# Patient Record
Sex: Male | Born: 1940 | Race: White | Hispanic: No | Marital: Married | Smoking: Former smoker
Health system: Southern US, Community
[De-identification: ages and names within clinical notes are randomized; demographics above are authoritative.]

## PROBLEM LIST (undated history)

## (undated) DIAGNOSIS — Z860100 Personal history of colon polyps, unspecified: Secondary | ICD-10-CM

## (undated) DIAGNOSIS — K5790 Diverticulosis of intestine, part unspecified, without perforation or abscess without bleeding: Secondary | ICD-10-CM

## (undated) DIAGNOSIS — R160 Hepatomegaly, not elsewhere classified: Secondary | ICD-10-CM

## (undated) DIAGNOSIS — E669 Obesity, unspecified: Secondary | ICD-10-CM

## (undated) DIAGNOSIS — I2510 Atherosclerotic heart disease of native coronary artery without angina pectoris: Secondary | ICD-10-CM

## (undated) DIAGNOSIS — E785 Hyperlipidemia, unspecified: Secondary | ICD-10-CM

## (undated) DIAGNOSIS — G4733 Obstructive sleep apnea (adult) (pediatric): Secondary | ICD-10-CM

## (undated) DIAGNOSIS — I10 Essential (primary) hypertension: Secondary | ICD-10-CM

## (undated) DIAGNOSIS — E119 Type 2 diabetes mellitus without complications: Secondary | ICD-10-CM

## (undated) HISTORY — DX: Personal history of colon polyps, unspecified: Z86.0100

## (undated) HISTORY — DX: Diverticulosis of intestine, part unspecified, without perforation or abscess without bleeding: K57.90

## (undated) HISTORY — DX: Hepatomegaly, not elsewhere classified: R16.0

## (undated) HISTORY — DX: Obstructive sleep apnea (adult) (pediatric): G47.33

## (undated) HISTORY — DX: Personal history of colonic polyps: Z86.010

## (undated) HISTORY — DX: Hyperlipidemia, unspecified: E78.5

## (undated) HISTORY — PX: COLONOSCOPY: SHX174

## (undated) HISTORY — DX: Type 2 diabetes mellitus without complications: E11.9

## (undated) HISTORY — DX: Obesity, unspecified: E66.9

## (undated) HISTORY — PX: POLYPECTOMY: SHX149

## (undated) HISTORY — PX: TONSILLECTOMY: SUR1361

## (undated) HISTORY — DX: Atherosclerotic heart disease of native coronary artery without angina pectoris: I25.10

## (undated) HISTORY — DX: Essential (primary) hypertension: I10

---

## 2004-06-27 ENCOUNTER — Ambulatory Visit (HOSPITAL_COMMUNITY): Admission: RE | Admit: 2004-06-27 | Discharge: 2004-06-27 | Payer: Self-pay | Admitting: Orthopaedic Surgery

## 2004-11-21 ENCOUNTER — Ambulatory Visit: Payer: Self-pay | Admitting: Family Medicine

## 2006-08-30 HISTORY — PX: CORONARY STENT PLACEMENT: SHX1402

## 2006-11-27 ENCOUNTER — Ambulatory Visit: Payer: Self-pay | Admitting: Cardiology

## 2006-11-27 ENCOUNTER — Inpatient Hospital Stay (HOSPITAL_COMMUNITY): Admission: EM | Admit: 2006-11-27 | Discharge: 2006-11-28 | Payer: Self-pay | Admitting: Emergency Medicine

## 2006-12-10 ENCOUNTER — Ambulatory Visit: Payer: Self-pay | Admitting: Internal Medicine

## 2007-02-25 ENCOUNTER — Ambulatory Visit: Payer: Self-pay | Admitting: Cardiovascular Disease

## 2007-04-02 ENCOUNTER — Ambulatory Visit: Payer: Self-pay | Admitting: Internal Medicine

## 2007-04-17 ENCOUNTER — Ambulatory Visit: Payer: Self-pay

## 2007-04-17 ENCOUNTER — Encounter: Payer: Self-pay | Admitting: Internal Medicine

## 2007-06-09 ENCOUNTER — Ambulatory Visit: Payer: Self-pay | Admitting: Cardiovascular Disease

## 2007-08-22 ENCOUNTER — Ambulatory Visit: Payer: Self-pay | Admitting: Cardiovascular Disease

## 2007-10-23 ENCOUNTER — Ambulatory Visit: Payer: Self-pay | Admitting: Internal Medicine

## 2007-12-02 ENCOUNTER — Ambulatory Visit: Payer: Self-pay | Admitting: Cardiovascular Disease

## 2007-12-03 ENCOUNTER — Ambulatory Visit: Payer: Self-pay | Admitting: Cardiovascular Disease

## 2008-03-19 ENCOUNTER — Ambulatory Visit: Payer: Self-pay | Admitting: Cardiovascular Disease

## 2008-06-18 ENCOUNTER — Ambulatory Visit: Payer: Self-pay | Admitting: Cardiovascular Disease

## 2008-07-06 ENCOUNTER — Ambulatory Visit: Payer: Self-pay | Admitting: Internal Medicine

## 2008-09-24 ENCOUNTER — Ambulatory Visit: Payer: Self-pay | Admitting: Cardiovascular Disease

## 2008-12-21 DIAGNOSIS — I10 Essential (primary) hypertension: Secondary | ICD-10-CM | POA: Insufficient documentation

## 2008-12-21 DIAGNOSIS — E669 Obesity, unspecified: Secondary | ICD-10-CM | POA: Insufficient documentation

## 2008-12-21 DIAGNOSIS — G4733 Obstructive sleep apnea (adult) (pediatric): Secondary | ICD-10-CM | POA: Insufficient documentation

## 2008-12-21 DIAGNOSIS — E785 Hyperlipidemia, unspecified: Secondary | ICD-10-CM | POA: Insufficient documentation

## 2008-12-21 DIAGNOSIS — I2510 Atherosclerotic heart disease of native coronary artery without angina pectoris: Secondary | ICD-10-CM | POA: Insufficient documentation

## 2008-12-22 ENCOUNTER — Ambulatory Visit: Payer: Self-pay | Admitting: Cardiovascular Disease

## 2008-12-22 ENCOUNTER — Encounter: Payer: Self-pay | Admitting: Physician Assistant

## 2008-12-22 ENCOUNTER — Ambulatory Visit: Payer: Self-pay | Admitting: Internal Medicine

## 2008-12-22 ENCOUNTER — Encounter: Payer: Self-pay | Admitting: Cardiology

## 2008-12-22 DIAGNOSIS — I259 Chronic ischemic heart disease, unspecified: Secondary | ICD-10-CM | POA: Insufficient documentation

## 2008-12-28 ENCOUNTER — Telehealth: Payer: Self-pay | Admitting: Internal Medicine

## 2008-12-29 ENCOUNTER — Ambulatory Visit: Payer: Self-pay | Admitting: Cardiology

## 2008-12-29 ENCOUNTER — Inpatient Hospital Stay (HOSPITAL_BASED_OUTPATIENT_CLINIC_OR_DEPARTMENT_OTHER): Admission: RE | Admit: 2008-12-29 | Discharge: 2008-12-29 | Payer: Self-pay | Admitting: Cardiology

## 2009-01-05 ENCOUNTER — Ambulatory Visit: Payer: Self-pay | Admitting: Cardiovascular Disease

## 2009-01-05 ENCOUNTER — Encounter: Payer: Self-pay | Admitting: Physician Assistant

## 2009-01-05 ENCOUNTER — Encounter: Payer: Self-pay | Admitting: Internal Medicine

## 2009-01-05 ENCOUNTER — Ambulatory Visit: Payer: Self-pay | Admitting: Cardiology

## 2009-01-05 DIAGNOSIS — T148XXA Other injury of unspecified body region, initial encounter: Secondary | ICD-10-CM | POA: Insufficient documentation

## 2009-02-11 ENCOUNTER — Telehealth: Payer: Self-pay | Admitting: Internal Medicine

## 2009-02-14 ENCOUNTER — Ambulatory Visit: Payer: Self-pay | Admitting: Internal Medicine

## 2009-02-17 LAB — CONVERTED CEMR LAB
ALT: 24 units/L (ref 0–53)
AST: 25 units/L (ref 0–37)
Bilirubin, Direct: 0.1 mg/dL (ref 0.0–0.3)
LDL Cholesterol: 53 mg/dL (ref 0–99)
Total Bilirubin: 1.1 mg/dL (ref 0.3–1.2)
Total CHOL/HDL Ratio: 3
VLDL: 11 mg/dL (ref 0.0–40.0)

## 2009-03-07 ENCOUNTER — Ambulatory Visit: Payer: Self-pay | Admitting: Internal Medicine

## 2009-03-07 DIAGNOSIS — R609 Edema, unspecified: Secondary | ICD-10-CM

## 2009-03-07 HISTORY — DX: Edema, unspecified: R60.9

## 2009-12-20 ENCOUNTER — Encounter: Payer: Self-pay | Admitting: Internal Medicine

## 2009-12-28 ENCOUNTER — Encounter: Payer: Self-pay | Admitting: Internal Medicine

## 2010-02-28 ENCOUNTER — Ambulatory Visit: Payer: Self-pay | Admitting: Internal Medicine

## 2010-06-18 ENCOUNTER — Ambulatory Visit: Payer: Self-pay | Admitting: Cardiology

## 2010-06-18 ENCOUNTER — Inpatient Hospital Stay (HOSPITAL_COMMUNITY): Admission: EM | Admit: 2010-06-18 | Discharge: 2010-06-20 | Payer: Self-pay | Admitting: Emergency Medicine

## 2010-06-18 ENCOUNTER — Ambulatory Visit: Payer: Self-pay | Admitting: Internal Medicine

## 2010-08-27 LAB — CONVERTED CEMR LAB
BUN: 10 mg/dL (ref 6–23)
Basophils Relative: 0.3 % (ref 0.0–3.0)
CO2: 28 meq/L (ref 19–32)
Chloride: 111 meq/L (ref 96–112)
Eosinophils Relative: 2.5 % (ref 0.0–5.0)
Glucose, Bld: 97 mg/dL (ref 70–99)
Lymphocytes Relative: 31 % (ref 12.0–46.0)
MCV: 97.2 fL (ref 78.0–100.0)
Monocytes Relative: 8.4 % (ref 3.0–12.0)
Neutrophils Relative %: 57.8 % (ref 43.0–77.0)
Potassium: 4 meq/L (ref 3.5–5.1)
RBC: 4.29 M/uL (ref 4.22–5.81)
WBC: 6.6 10*3/uL (ref 4.5–10.5)
aPTT: 30.9 s — ABNORMAL HIGH (ref 21.7–28.8)

## 2010-08-31 NOTE — Assessment & Plan Note (Signed)
Summary: W1U      Allergies Added: NKDA  Visit Type:  Follow-up Primary Provider:  Christian Mate, DO  CC:  no complaints.  History of Present Illness:  [name] is a delightful 70 year old male with a history of hypertension, hyperlipidemia, and obstructive sleep apnea.  He has a history of 1-vessel coronary artery disease and is status post stenting of his LAD with Cypher drug-eluting stent in February 2008.  He has normal LV function.   In june he underwent re-look cath as part of the Saturn study which showed patent LAD stent and no other significant CAD. He returns for routine f/u.   Doing great. No CP/SOB. Wallks 1.5+ miles/day at decent pace. No problems with bleeding on Plavix. Occ mild edema. No orthopnea, PND. Very active. Using CPAP religiously.  Most recent lipids with TC 149 HDL 46 LDL 87 (previously 53) TG 81   Current Medications (verified): 1)  Plavix 75 Mg Tabs (Clopidogrel Bisulfate) .... Take One Tablet By Mouth Daily 2)  Centrum Silver  Tabs (Multiple Vitamins-Minerals) .... Take 1 Tablet By Mouth Once A Day 3)  Glucosamine Msm Complex  Tabs (Glucos-Msm-C-Mn-Ginger-Willow) .... Take 1 Tablet By Mouth Two Times A Day 4)  Aspirin 81 Mg Tbec (Aspirin) .... Take One Tablet By Mouth Daily 5)  Nitroglycerin 0.4 Mg Subl (Nitroglycerin) .... One Tablet Under Tongue Every 5 Minutes As Needed For Chest Pain---May Repeat Times Three 6)  Lipitor 40 Mg Tabs (Atorvastatin Calcium) .... Take One Tablet By Mouth Daily. 7)  Fish Oil   Oil (Fish Oil) .... Take One Tablet By Mouth Once Daily. 8)  Lisinopril 40 Mg Tabs (Lisinopril) .... Take One Tablet By Mouth Daily  Allergies (verified): No Known Drug Allergies  Past History:  Past Medical History: Last updated: 12/21/2008 CAD (ICD-414.00)- 1-vessel coronary artery disease and is status post stenting of his LAD with Cypher  drug-eluting stent in February 2008. HYPERLIPIDEMIA (ICD-272.4) HYPERTENSION (ICD-401.9) OBESITY  (ICD-278.00) SLEEP APNEA, OBSTRUCTIVE (ICD-327.23)  Review of Systems       As per HPI and past medical history; otherwise all systems negative.   Vital Signs:  Patient profile:   70 year old male Height:      70 inches Weight:      227 pounds BMI:     32.69 Pulse rate:   62 / minute BP sitting:   118 / 68  (right arm) Cuff size:   regular  Vitals Entered By: Hardin Negus, RMA (February 28, 2010 11:09 AM)  Physical Exam  General:   Well-nournished, in no acute distress. Neck: No JVD, HJR, Bruit, or thyroid enlargement Lungs: No tachypnea, clear without wheezing, rales, or rhonchi Cardiovascular: RRR, PMI not displaced, soft SEM over aortic valve. S2 ok . +s4  Abdomen: BS normal. Soft without organomegaly, masses, lesions or tenderness. Extremities: no cyanosis, clubbing. tr - 1+ edema. Good distal pulses bilateral SKin: Warm, no lesions or rashes  Musculoskeletal: No deformities Neuro: no focal signs    Impression & Recommendations:  Problem # 1:  CAD (ICD-414.00) Stable. No evidence of ischemia. Continue current regimen.  Problem # 2:  HYPERLIPIDEMIA (ICD-272.4) LDL has crept up without signifcant change in diet or meds. Will have them repeated in 1 month or 2 and if LDL still > 70 increase lipitor to 80.   Problem # 3:  EDEMA (ICD-782.3) Unchanged. Discussed possible addition of prn lasix but he says edema is mild and doesn't bother him. Will keep legs elevated. Knows to  call if getting worse.   Other Orders: EKG w/ Interpretation (93000)  Patient Instructions: 1)  Your physician wants you to follow-up in:  1 year.  You will receive a reminder letter in the mail two months in advance. If you don't receive a letter, please call our office to schedule the follow-up appointment. Prescriptions: NITROGLYCERIN 0.4 MG SUBL (NITROGLYCERIN) One tablet under tongue every 5 minutes as needed for chest pain---may repeat times three  #25 x 6   Entered by:   Meredith Staggers,  RN   Authorized by:   Dolores Patty, MD, [facility]   Signed by:   Meredith Staggers, RN on 02/28/2010   Method used:   Faxed to ...       [facility]       [address]       [place], [place]  [address]       Ph: [phone] or [phone]       Fax: [phone]   RxID:   [id number]

## 2010-09-21 ENCOUNTER — Encounter: Payer: Self-pay | Admitting: Internal Medicine

## 2010-09-21 ENCOUNTER — Ambulatory Visit (INDEPENDENT_AMBULATORY_CARE_PROVIDER_SITE_OTHER): Payer: Medicare Other | Admitting: Internal Medicine

## 2010-09-21 DIAGNOSIS — E785 Hyperlipidemia, unspecified: Secondary | ICD-10-CM

## 2010-09-21 DIAGNOSIS — I2510 Atherosclerotic heart disease of native coronary artery without angina pectoris: Secondary | ICD-10-CM

## 2010-09-21 DIAGNOSIS — I10 Essential (primary) hypertension: Secondary | ICD-10-CM

## 2010-09-26 NOTE — Assessment & Plan Note (Signed)
Summary: eph/ jml  per chris b rs per pt call-mb  Medications Added GLUCOSAMINE MSM COMPLEX  TABS (GLUCOS-MSM-C-MN-GINGER-WILLOW) Take 1 tablet by mouth daily * OTC PROSTATE MEDICATION Take 1 tablet by mouth once a day      Allergies Added: NKDA  Visit Type:  Follow-up Primary Provider:  Vernon Prey, MD  CC:  no complaints.  History of Present Illness:  [name] is a delightful 70 year old male with a history of hypertension, hyperlipidemia, and obstructive sleep apnea.  He has a history of 1-vessel coronary artery disease and is status post stenting of his LAD with Cypher drug-eluting stent in February 2008.  He has normal LV function.   Admitted for CP in 11/11. Left heart cardiac catheterization revealing patent stent in the proximal left anterior descending, but otherwise, nonobstructive disease and ejection fraction of 55-60% without regional wall motion abnormalities.  Doing great. No CP/SOB. Wallks 1 miles/day at decent pace 3.5-4.0 mph 1.5% incline every day. + some weights on MWF. No problems with bleeding on Plavix. No orthopnea, PND. Using CPAP religiously. No palpitations.   Due for lipid check with PCP soon.  Feels like one of his medicines gives him a hunger pain.   Problems Prior to Update: 1)  Edema  (ICD-782.3) 2)  Hematoma  (ICD-924.9) 3)  Unspecified Chronic Ischemic Heart Disease  (ICD-414.9) 4)  Cad  (ICD-414.00) 5)  Hyperlipidemia  (ICD-272.4) 6)  Hypertension  (ICD-401.9) 7)  Obesity  (ICD-278.00) 8)  Sleep Apnea, Obstructive  (ICD-327.23)  Current Medications (verified): 1)  Plavix 75 Mg Tabs (Clopidogrel Bisulfate) .... Take One Tablet By Mouth Daily 2)  Centrum Silver  Tabs (Multiple Vitamins-Minerals) .... Take 1 Tablet By Mouth Once A Day 3)  Glucosamine Msm Complex  Tabs (Glucos-Msm-C-Mn-Ginger-Willow) .... Take 1 Tablet By Mouth Daily 4)  Aspirin 81 Mg Tbec (Aspirin) .... Take One Tablet By Mouth Daily 5)  Nitroglycerin 0.4 Mg Subl (Nitroglycerin) ....  One Tablet Under Tongue Every 5 Minutes As Needed For Chest Pain---May Repeat Times Three 6)  Lipitor 40 Mg Tabs (Atorvastatin Calcium) .... Take One Tablet By Mouth Daily. 7)  Fish Oil   Oil (Fish Oil) .... Take One Tablet By Mouth Once Daily. 8)  Lisinopril 40 Mg Tabs (Lisinopril) .... Take One Tablet By Mouth Daily 9)  Otc Prostate Medication .... Take 1 Tablet By Mouth Once A Day  Allergies (verified): No Known Drug Allergies  Past History:  Past Medical History: CAD (ICD-414.00)- 1-vessel coronary artery disease and is status post stenting of his LAD with Cypher  drug-eluting stent in February 2008.    --cath 11/11: LAD stent patent/ otherwise minimal CAD. Normal EF HYPERLIPIDEMIA (ICD-272.4) HYPERTENSION (ICD-401.9) OBESITY (ICD-278.00) SLEEP APNEA, OBSTRUCTIVE (ICD-327.23)  Review of Systems       As per HPI and past medical history; otherwise all systems negative.   Vital Signs:  Patient profile:   70 year old male Height:      70 inches Weight:      232.50 pounds Pulse rate:   67 / minute BP sitting:   120 / 58  (right arm) Cuff size:   regular  Vitals Entered By: Micki Riley CNA (September 21, 2010 9:49 AM)  Physical Exam  General:   Well-nournished, in no acute distress. Neck: No JVD, HJR, Bruit, or thyroid enlargement Lungs: No tachypnea, clear without wheezing, rales, or rhonchi Cardiovascular: RRR, PMI not displaced, soft SEM over aortic valve. S2 ok . +s4  Abdomen: BS normal. Soft without organomegaly,  masses, lesions or tenderness. Extremities: no cyanosis, clubbing. no edema. Good distal pulses bilateral SKin: Warm, no lesions or rashes  Musculoskeletal: No deformities Neuro: no focal signs    Impression & Recommendations:  Problem # 1:  CAD (ICD-414.00) Stable. Recent cath looked good. Continue current regimen including Plavix. Discusssed use of interval training to help build fitness.   Problem # 2:  HYPERTENSION (ICD-401.9) Blood pressure  well controlled. Continue current regimen.  Problem # 3:  HYPERLIPIDEMIA (ICD-272.4) Doing well on current regimen. Goal LDL < 70. Will have lipid/liver panel done as part of physical with PCP and send it to [place].   Other Orders: EKG w/ Interpretation (93000)  Patient Instructions: 1)  Your physician wants you to follow-up in:   9 months.  You will receive a reminder letter in the mail two months in advance. If you don't receive a letter, please call our office to schedule the follow-up appointment.

## 2010-10-10 LAB — COMPREHENSIVE METABOLIC PANEL
ALT: 61 U/L — ABNORMAL HIGH (ref 0–53)
AST: 64 U/L — ABNORMAL HIGH (ref 0–37)
Albumin: 4 g/dL (ref 3.5–5.2)
CO2: 25 mEq/L (ref 19–32)
Calcium: 9.4 mg/dL (ref 8.4–10.5)
Chloride: 104 mEq/L (ref 96–112)
Creatinine, Ser: 1.1 mg/dL (ref 0.4–1.5)
GFR calc Af Amer: >60 mL/min (ref >60)
GFR calc non Af Amer: >60 mL/min (ref >60)
Sodium: 138 mEq/L (ref 135–145)

## 2010-10-10 LAB — LIPID PANEL
Cholesterol: 105 mg/dL (ref 0–200)
LDL Cholesterol: 51 mg/dL (ref 0–99)
Triglycerides: 92 mg/dL (ref <150)

## 2010-10-10 LAB — CBC
HCT: 37.3 % — ABNORMAL LOW (ref 39.0–52.0)
Hemoglobin: 12.6 g/dL — ABNORMAL LOW (ref 13.0–17.0)
Hemoglobin: 14.1 g/dL (ref 13.0–17.0)
MCH: 33.4 pg (ref 26.0–34.0)
MCH: 33.8 pg (ref 26.0–34.0)
MCHC: 33.3 g/dL (ref 30.0–36.0)
MCHC: 33.8 g/dL (ref 30.0–36.0)
MCHC: 34.7 g/dL (ref 30.0–36.0)
MCV: 100 fL (ref 78.0–100.0)
MCV: 100.3 fL — ABNORMAL HIGH (ref 78.0–100.0)
Platelets: 139 10*3/uL — ABNORMAL LOW (ref 150–400)
Platelets: 161 10*3/uL (ref 150–400)
RBC: 3.83 MIL/uL — ABNORMAL LOW (ref 4.22–5.81)
RBC: 4.13 MIL/uL — ABNORMAL LOW (ref 4.22–5.81)
RDW: 13.3 % (ref 11.5–15.5)

## 2010-10-10 LAB — BASIC METABOLIC PANEL
CO2: 24 mEq/L (ref 19–32)
Calcium: 8.7 mg/dL (ref 8.4–10.5)
Chloride: 108 mEq/L (ref 96–112)
Creatinine, Ser: 0.89 mg/dL (ref 0.4–1.5)
GFR calc Af Amer: >60 mL/min (ref >60)
Glucose, Bld: 96 mg/dL (ref 70–99)

## 2010-10-10 LAB — POCT CARDIAC MARKERS
CKMB, poc: <1 ng/mL — ABNORMAL LOW (ref 1.0–8.0)
Troponin i, poc: <0.05 ng/mL (ref 0.00–0.09)

## 2010-10-10 LAB — CARDIAC PANEL(CRET KIN+CKTOT+MB+TROPI)
CK, MB: 2 ng/mL (ref 0.3–4.0)
Relative Index: INVALID (ref 0.0–2.5)
Total CK: 83 U/L (ref 7–232)

## 2010-10-10 LAB — CK TOTAL AND CKMB (NOT AT ARMC)
CK, MB: 1.9 ng/mL (ref 0.3–4.0)
Relative Index: INVALID (ref 0.0–2.5)
Total CK: 78 U/L (ref 7–232)

## 2010-10-10 LAB — HEPARIN LEVEL (UNFRACTIONATED): Heparin Unfractionated: <0.1 IU/mL — ABNORMAL LOW (ref 0.30–0.70)

## 2010-10-10 LAB — PROTIME-INR: Prothrombin Time: 13.2 seconds (ref 11.6–15.2)

## 2010-10-10 LAB — TSH: TSH: 2.899 u[IU]/mL (ref 0.350–4.500)

## 2010-12-12 NOTE — Cardiovascular Report (Signed)
NAMEJIOVANY, [name] NO.:  [id number]   MEDICAL RECORD NO.:  [id number]          PATIENT TYPE:  OIB   LOCATION:  1962                         FACILITY:  [facility]   PHYSICIAN:  Jared Morton. Riley Kill, MD, FACCDATE OF BIRTH:  Jan 29, 1941   DATE OF PROCEDURE:  DATE OF DISCHARGE:                            CARDIAC CATHETERIZATION   INDICATIONS:  [name] is a very pleasant gentleman who underwent  cardiac catheterization and stenting of the left anterior descending  artery after presenting with exertional chest pain.  He had successful  placement of 3 x 18 Cypher drug-eluting stent.  As part of the  procedure, the patient consented to the SATURN trial, with intravascular  ultrasound of the circumflex coronary artery.  He returns today for  repeat cardiac catheterization with intravascular ultrasound.  Risks,  benefits, and alternatives have been discussed with the patient, the  nature of the research trial and indications for proceeding have been  discussed.  Questions have been answered.  The patient was agreeable to  proceed.   PROCEDURES:  1. Left heart catheterization.  2. Selective coronary arteriography.  3. Selective left ventriculography.  4. Intravascular ultrasound of the circumflex coronary artery.  5. Femoral closure.   DESCRIPTION OF PROCEDURE:  The patient was brought to the  catheterization laboratory after informed consent.  Prepped and draped  in the usual fashion.  Through an anterior puncture, the right femoral  artery was easily entered on the first stick and a 6-French femoral  sheath placed.  Following this, views of the right coronary artery were  obtained.  Intravenous heparin was given at a dose of 50 units per kg.  ACT was subsequently checked and found to be slightly over 300 seconds.  Following this, central aortic and left ventricular pressures were  measured with pigtail and ventriculography was performed in the RAO  projection.  Following  this, a JL 3.5 guiding catheter was utilized to  engage the left main.  Views of the previously placed stent were  obtained.  In addition, views of the circumflex coronary artery for  appropriateness of intravascular ultrasound were then obtained.  After  this, a Prowater guidewire was placed carefully down into the distal  vessel.  Intracoronary nitroglycerin was given at 150 mcg.  Following  this, intravascular ultrasound was performed with more than a greater  than 40-mm pullback.  The ultrasonic catheter was subsequently removed.  Following this, the wire was removed and a final angiogram of the  circumflex obtained.  Catheters were subsequently removed.  An  additional femoral angiogram was then performed, which demonstrated the  entry site above the bifurcation.  The groin was then reprepped and the  gloves exchanged.  An Angio-Seal 6-French device was then deployed with  excellent hemostasis.  There were no major complications.  He was taken  to the holding area in satisfactory clinical condition.  After this, I  reviewed the films with his wife.  I then explained to the patient and  his wife instructions with regard to groin care and with specific  attention to no tub baths.  HEMODYNAMIC DATA:  1. Central aortic pressure 117/62, mean 86.  2. Left ventricular pressure 120/21.  3. No gradient or pullback across the aortic valve.   ANGIOGRAPHIC DATA:  1. Ventriculography in the RAO projection reveals vigorous global      systolic function.  No definite wall motion abnormalities seen and      there does not appear to be significant mitral regurgitation.  2. The right coronary artery is a moderate-sized vessel.  There is      mild tip narrowing in the very proximal portion of the vessel, but      this is at the catheter tip, presumed to be catheter related spasm.      It is not flow limiting.  Importantly, the remainder of the artery      appears without significant narrowing, but  with minimal luminal      irregularity.  3. The left main is free of critical disease and is short.  4. The LAD courses to the apex and provides a diagonal and distal LAD.      The previously placed Cypher stent is widely patent.  5. The circumflex is a large-caliber vessel without significant focal      narrowing.  It consists predominantly of one very large marginal      branch.  There is a tiny intermediate tiny first and second      marginal branches, which are insignificant with regard to the      vascular distribution.  The third marginal is what supplies the      entire remainder of the vessel.   Intravascular ultrasound was performed over 40 mm.  There was mild  luminal plaquing, but no significant areas of high-grade focal  obstruction.   CONCLUSION:  1. Normal left ventricular function.  2. Continued patency of the previously placed stent to the left      anterior descending artery.  3. Successful intravascular ultrasound of the circumflex artery and      the SATURN trial.      Jared Morton. Riley Kill, MD, [facility]  Electronically Signed     TDS/MEDQ  D:  12/29/2008  T:  12/29/2008  Job:  [id number]   cc:   Jared Cooler. Bensimhon, MD  Jared Conn, RN

## 2010-12-12 NOTE — Assessment & Plan Note (Signed)
[facility]                            CARDIOLOGY OFFICE NOTE   [name], [name]                        MRN:          [id number]  DATE:10/23/2007                            DOB:          1941/07/17    PRIMARY CARE PHYSICIAN:  [name], M.D., [facility].   HISTORY:  [name] is a delightful 70 year old male with a history of  hypertension, hyperlipidemia, and obstructive sleep apnea.  He also has  a history of one-vessel coronary artery disease and is status post PTCA  and stenting of the LAD with Cypher drug-eluting stent back in February  of 2008.  He returns for routine followup.   He is doing great.  He is walking about 25-30 minutes a day at a very  brisk pace without any problems.  He says he occasionally gets a twinge  of chest pain at rest which is chronic for him.  He has not had anything  like his previous angina.  His blood pressure has been well-controlled.  He has not had any problems with his medications.   CURRENT MEDICATIONS:  1. Aspirin 325 a day.  2. Plavix 75 a day.  3. Centrum Silver.  4. Glucosamine.  5. SATURN study drug which is Crestor versus Lipitor.  6. Lisinopril 40 mg a day.   PHYSICAL EXAMINATION:  GENERAL:  He is well-appearing and in no acute  distress.  He ambulates around the clinic without any respiratory  difficulty.  VITAL SIGNS:  Blood pressure 100/60, heart rate 76, weight 225.  HEENT:  Normal.  NECK:  Supple.  No JVD.  Carotids are 2+ bilateral without any bruits.  There is no lymphadenopathy or thyromegaly.  CARDIAC:  PMI is nondisplaced.  Regular rate and rhythm.  No murmurs,  rubs or gallops.  LUNGS:  Clear.  ABDOMEN:  Obese, nontender, nondistended.  No hepatosplenomegaly, no  bruits, no masses.  Good bowel sounds.  EXTREMITIES:  Warm with no cyanosis, clubbing or edema.  Good distal  pulses.  NEUROLOGIC:  Alert and oriented x3.  Cranial nerves II-XII intact.  Moves all four extremities  without difficulty.  Affect is pleasant.   EKG:  Sinus rhythm at a rate of 75, no ST-T wave abnormalities.   ASSESSMENT AND PLAN:  1. Coronary artery disease.  He is doing well.  No evidence of      ischemia.  Continue current therapy.  2. Hyperlipidemia.  He is using the SATURN study.  3. Hypertension.  Well-controlled.   DISPOSITION:  He is doing great.  I will see him back in 9 months for  routine followup.  He knows to call me if there are any problems.     [name]. Bensimhon, MD  Electronically Signed    DRB/MedQ  DD: 10/23/2007  DT: 10/24/2007  Job #: [id number]   cc:   [name]

## 2010-12-12 NOTE — Discharge Summary (Signed)
[name], [name] NO.:  [id number]   MEDICAL RECORD NO.:  [id number]          PATIENT TYPE:  INP   LOCATION:  6525                         FACILITY:  [facility]   PHYSICIAN:  Bevelyn Buckles. Bensimhon, MDDATE OF BIRTH:  July 10, 1941   DATE OF ADMISSION:  11/26/2006  DATE OF DISCHARGE:  11/28/2006                               DISCHARGE SUMMARY   PRIMARY CARDIOLOGIST:  Bevelyn Buckles. Bensimhon, MD.   PRIMARY CARE [name]:  Dr. Samuel Ward, in [place].   PRINCIPAL DIAGNOSIS:  Unstable angina/coronary artery disease.   SECONDARY DIAGNOSES:  1. Hypertension.  2. Hyperlipidemia.  3. Obesity.  4. Asymptomatic bradycardia.  5. History of nosebleeds.   ALLERGIES:  NO KNOWN DRUG ALLERGIES.   PROCEDURES:  Left heart cardiac catheterization with successful PCI and  stenting of the proximal LAD with placement of a CYPHER drug-eluting  stent.   HISTORY OF PRESENT ILLNESS:  The patient is a 70 year old Caucasian male  with a two-week history of right-sided chest discomfort that has been  worsening with exertion and resolving with rest.  He had been referred  to see Dr. Gala Ward on Dec 11, 2006; however, his pain worsened on the  evening of November 25, 2006, thus prompting him to present to the [facility] ED for further evaluation.  In the ED, ECG showed sinus bradycardia  without any acute ST-T changes and he was pain free.  Decision was made  to admit him for evaluation.   HOSPITAL COURSE:  [name] ruled out for MI.  Given his exertional  symptoms, the decision was made to pursue left heart cardiac  catheterization which took place on November 27, 2006, and revealed a 90%  stenosis in the proximal LAD and otherwise nonobstructive disease.  His  EF was normal.  He then underwent successful PCI and stenting of the  proximal LAD with the placement of 3- x 18-mm CYPHER drug-eluting stent.  The patient was enrolled in the Saturn trial which involved  intravascular ultrasound  within the left circumflex as well as a Saturn  Statin drug postprocedure (Crestor versus Lipitor).   Postprocedure, the patient's cardiac markers have remained negative and  he has been ambulating without difficulty or recurrent symptoms.  He is  being discharged home today in satisfactory condition.   DISCHARGE LABS:  Hemoglobin 13.9, hematocrit 39.7, WBC 8.8, platelets  275,000, MCV 92.8.  Sodium 141, potassium 4.4, chloride 104, CO2 28, BUN  10, creatinine 1.04, glucose 102.  PT 13.3, INR 1.0, PTT 38, total  bilirubin 0.5, alkaline phosphatase 76, AST 23, ALT 33, albumin 3.9, CK  78, MB 2.3, troponin I 0.03, calcium 9.4.  TSH 2.813.   DISPOSITION:  The patient is being discharged home today in good  condition.   FOLLOW-UP PLANS AND APPOINTMENTS:  1. The patient has follow up with Dr. Arvilla Ward on Dec 11, 2006, at 2:30 p.m.  2. He is also to follow up with his primary care physician, Dr. Christian Ward, as scheduled.   DISCHARGE MEDICATIONS:  1.  Aspirin 325 mg daily.  2. Plavix 75 mg daily.  3. Lisinopril 10 mg daily.  4. Lipitor 80 mg q.h.s. until notified by our research coordinators to      switch over to the Saturn study drug (Crestor 5 mg versus Lipitor      40 mg).  5. Centrum Silver multivitamin one daily.  6. Nitroglycerin 0.4 mg sublingual p.r.n. chest pain.   As the patient has been enrolled in the Saturn study, he has been  advised to stop his fish oil.   OUTSTANDING LAB STUDIES:  None.   DURATION DISCHARGE ENCOUNTER:  Approximately 40 minutes including  physician time.      [name], ANP      Bevelyn Buckles. Bensimhon, MD  Electronically Signed    CB/MEDQ  D:  11/28/2006  T:  11/28/2006  Job:  [id number]   cc:   [name]

## 2010-12-12 NOTE — Assessment & Plan Note (Signed)
[facility]                            CARDIOLOGY OFFICE NOTE   JAQUIN, COY                        MRN:          [id number]  DATE:04/02/2007                            DOB:          [birth year]    PRIMARY CARE PHYSICIAN:  Dr. [name] in [place].   INTERVAL HISTORY:  Mr. Florea is a delightful 70 year old male with a  history of hypertension, hyperlipidemia, and obstructive sleep apnea.  He also has a history of 1-vessel coronary artery disease and is status  post PTCA and stenting of the LAD with a CYPHER drug-eluting stent back  in February of 2008.  He returns today for routine followup.   He says he is doing quite well.  He occasionally has a little spasm in  his chest, but he says it is very unlike his angina.  It is more like  musculoskeletal pain.  It can come and go at any time.  He has never had  a prolonged episode.  It has not awoken him from sleep.  He walks a mile-  and-a-quarter in the mornings and more in the afternoon and never has a  problem.  He has not had any heart failure symptoms.  He has been  compliant with his medications.   CURRENT MEDICATIONS:  1. Aspirin 325.  2. Plavix 75.  3. Centrum Silver.  4. Multivitamin.  5. Glucosamine.  6. SATURN study, which is Crestor versus Lipitor.  7. Lisinopril 40 mg a day.   PHYSICAL EXAM:  He is well-appearing, in no acute distress.  Ambulates  around the clinic without any respiratory difficulty.  Blood pressure is 117/68, heart rate is 61, weight 228.  HEENT:  Normal.  NECK:  Supple.  No JVD.  Carotids are 2+ bilaterally without any bruits.  There is no lymphadenopathy or thyromegaly.  CARDIAC:  PMI is not displaced.  He has a regular rate and rhythm with a  2/6 systolic ejection murmur over the LV outflow tract.  This does not  increase with inspiration.  No rub or gallop.  LUNGS:  Clear.  ABDOMEN:  Obese, nontender, nondistended.  No hepatosplenomegaly.  No  bruits.  No masses.  Good bowel sounds.  EXTREMITIES:  Warm with no cyanosis, clubbing, or edema.  No rash.  Distal pulses are strong.  NEURO:  Alert and oriented x3.  Cranial nerves 2-12 are intact.  Moves  all 4 extremities without difficulty.  Affect is very pleasant.   ASSESSMENT AND PLAN:  1. Coronary artery disease status post previous stenting of the left      anterior descending.  He is doing well.  He is having some atypical      chest pain, but I do not think this is angina.  He will continue      current therapy and would consider life-long Plavix.  2. Hypertension.  Well controlled.  3. Hyperlipidemia.  Currently enrolled in the SATURN study.  4. Murmur.  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  However, we will get      a 2D echo to confirm.  We will also ultrasound his carotids as part      of routine screening.     Bevelyn Buckles. Bensimhon, MD  Electronically Signed    DRB/MedQ  DD: 04/02/2007  DT: 04/02/2007  Job #: [id number]

## 2010-12-12 NOTE — H&P (Signed)
NAMEELEX, MAINWARING NO.:  [id number]   MEDICAL RECORD NO.:  [id number]          PATIENT TYPE:  INP   LOCATION:  1823                         FACILITY:  [facility]   PHYSICIAN:  Lowella Bandy, MD      DATE OF BIRTH:  06/09/1941   DATE OF ADMISSION:  11/26/2006  DATE OF DISCHARGE:                              HISTORY & PHYSICAL   PRIMARY CARE PHYSICIAN:  [name] in [place].   CHIEF COMPLAINT:  Chest pain.   HISTORY OF PRESENT ILLNESS:  Mr. Mincey is a very pleasant 70 year old  white male with hyperlipidemia who has been having chest pain for the  past couple of weeks  He reports that the pain is predominantly right-  sided in nature and has been more or less continuous for this two-week  period.  It does wax and wane some in severity.  It does get somewhat  worse with exertion, but is not completely relieved by rest.  It is  occasionally pleuritic in nature.  He denies any fevers, cough, or known  musculoskeletal injury.  He had been referred by his primary care doctor  to Dr. Arvilla Meres for an outpatient evaluation, but when the chest  pain worsened this evening, he presented to the emergency department.  He currently rates his pain at 2/10 in severity, but states that it was  8/10 in severity earlier this evening.  The severity has dissipated on  its own without medical therapy.   PAST MEDICAL HISTORY:  Hyperlipidemia.   MEDICATIONS:  1. Fish oil 1500 mg p.o. b.i.d.  2. Glucosamine.  3. Terbinafine.   ALLERGIES:  No known drug allergies.   SOCIAL HISTORY:  The patient quit smoking over 35 years ago.  He does  not drink alcohol.   FAMILY HISTORY:  His father died in his early 40s suddenly in his sleep.  Family was told it was MI.  No autopsy done.  No other known heart  disease.   REVIEW OF SYSTEMS:  Positive for joint pain and toenail fungus.  She has  10 systems otherwise negative other than as noted in the history of  present illness.   PHYSICAL EXAMINATION:  VITAL SIGNS:  Blood pressure 140/79, pulse 62,  oxygen saturation 96% on room air.  GENERAL:  The patient is breathing comfortably in no apparent distress.  Mildly obese.  HEENT:  Normocephalic, atraumatic.  Sclerae anicteric.  Oropharynx  clear.  Extraocular movements intact.  NECK:  Supple.  No masses appreciated.  No carotid bruits or JVD.  CARDIOVASCULAR:  Regular rate and rhythm.  Normal S1 and S2.  Soft, 1/6  early systolic ejection murmur at the right upper sternal border.  CHEST:  Clear to auscultation bilaterally.  ABDOMEN:  Soft, nontender, nondistended.  Normal bowel sounds.  EXTREMITIES:  Unremarkable. No edema.  SKIN:  No rashes.  NEUROLOGIC:  Alert and oriented x3.  Cranial nerves grossly intact.  Moves all extremities well.  MUSCULOSKELETAL:  No joint effusions or erythema.  SKIN:  No rashes.  PSYCHIATRIC:  Affect pleasant and appropriate.  Alert and oriented x3.   LABORATORY DATA:  EKG was personally reviewed and showed sinus  bradycardia at 53 beats per minute.  No ST-T wave abnormalities.   Troponin less than 0.05, CK-MB 1.1.  Sodium 138, potassium 3.8, chloride  108, bicarb 27, BUN 14, creatinine 2.9, glucose 99.  White cell count  8.7, hemoglobin 14.2, hematocrit 41, platelets 193.  Chest x-ray:  No  acute cardiopulmonary disease.   ASSESSMENT/PLAN:  A 70 year old white male with hyperlipidemia and  possible family history of myocardial infarction who presents with chest  pain for the past couple of weeks that is largely atypical for ischemia.  He does not have any EKG abnormalities and his initial sets of cardiac  enzymes are negative.  1. We will admit to Dr. Gala Romney to rule out myocardial infarction.  2. We will start daily aspirin (the patient has already received      aspirin in the emergency department) and there does not appear to      be a need for therapeutic heparin at this time.  3. We will check a  fasting lipid profile. The patient may need to be      started on statin.  4. His blood pressure is a little high.  We will start lisinopril.  5. As noted, the chest pain has largely atypical features, but he does      have some risk factors given his age, hyperlipidemia, and possible      family history.  Therefore, if he rules out for myocardial      infarction, it seems reasonable to proceed with a noninvasive      stress evaluation.      Lowella Bandy, MD  Electronically Signed     JJC/MEDQ  D:  11/27/2006  T:  11/27/2006  Job:  [id number]

## 2010-12-12 NOTE — Cardiovascular Report (Signed)
NAMESHONTEZ, SERMON NO.:  [id number]   MEDICAL RECORD NO.:  [id number]          PATIENT TYPE:  INP   LOCATION:  2018                         FACILITY:  [facility]   PHYSICIAN:  Arturo Morton. Riley Kill, MD, FACCDATE OF BIRTH:  02/22/1941   DATE OF PROCEDURE:  11/27/2006  DATE OF DISCHARGE:                            CARDIAC CATHETERIZATION   INDICATIONS:  Mr. Rumery is a 70 year old gentleman who presents with  chest pain for the past two weeks.  It seems to get worse with exertion.  He was seen and subsequently referred for diagnostic cardiac  catheterization.  The patient was also consented for the Saturn study.   PROCEDURES:  1. Left heart catheterization.  2. selective coronary arteriography.  3. Selective left ventriculography.  4. PTCA and stenting of the left anterior descending artery.  5. Intravascular ultrasound of the circumflex coronary artery per the      Saturn trial.   DESCRIPTION OF PROCEDURE:  The patient was brought to the  catheterization laboratory and prepped and draped in the usual fashion.  Through an anterior puncture, the right femoral artery was easily  entered.  A 5-French sheath was placed.  Following this, views of the  left and right coronary arteries were obtained in multiple angiographic  projections.  Central aortic and left ventricular pressures were  measured with a pigtail catheter.  Ventriculography was then performed  in the RAO projection.  The patient had evidence of a high-grade lesion  in the proximal portion of the left anterior descending artery.  I  discussed the options and recommendations with the patient to he desired  to proceed to percutaneous intervention in the catheterization  laboratory.  I then spoke with his wife by phone.  The patient said he  had no history of bleeding.  Following this, the patient's 5-French  sheath was exchanged for a 6-French sheath a JL 3.5 guiding catheter was  utilized.  Bivalirudin was  given according to protocol.  The patient had  received four chewable aspirins earlier in the day, and received 600 mg  of oral clopidogrel.  The ACT was then checked and found to be  appropriate for percutaneous intervention.  A Prowater wire was taken  down the LAD and a 12 x 2.5 Maverick balloon was used to predilate the  stenosis.  There was significant ST elevation with this.  We then  carefully measured the lesion, and a 3 x 18 Cypher drug-eluting stent  was placed at the lesion site, and taken up to 14 atmospheres.  The  stent appeared to be slightly undersized and we therefore placed a 3.5 x  13 PowerSail in the artery and this was taken up to as high as 16  atmospheres with marked improvement in the appearance of the artery.  The stent was dilated throughout.  There was no obvious evidence of edge  tear.  Attention was then turned towards intravascular ultrasound of the  circumflex coronary artery which was a large-caliber vessel.  Intracoronary nitroglycerin was administered.  The [employer]  IVUS catheter was then passed down with iLab,  and a pullback was  performed at 0.5 mm/sec.  The IVUS catheter was then subsequently  removed.  A final shot done to demonstrate patency of the vessel.  He  tolerated this without difficulty all catheters were subsequently  removed and the femoral sheath was sewn into place.   HEMODYNAMIC DATA:  1. Central aortic pressure 105/61.  2. The left ventricular pressure 116/11.  3. No gradient on pullback across the aortic valve.   ANGIOGRAPHIC DATA:  1. Ventriculography in the RAO projection reveals well-preserved      systolic function without segmental wall motion abnormality.  2. The left main is free of critical disease.  3. LAD courses to the apex and wraps the apical tip just proximal to      the septal and diagonal is a 90% focal stenosis.  This measures      about 12 mm in length.  Following deployment of the 18 mm stent and       post dilatation with a 3.5 mL balloon,  the stenosis reduced from      80-90% down to 0%.  There is an excellent angiographic appearance      without evidence of edge tear.  4. There is a small ramus intermedius without critical narrowing.  5. The circumflex is a large-caliber vessel providing an obtuse      marginal which is free of critical disease.  6. The right coronary artery is a modest size vessel with a posterior      descending and posterolateral branch without critical narrowing.   CONCLUSIONS:  1. Preserved left ventricular function.  2. Critical stenosis in the proximal left anterior descending artery      with successful percutaneous stenting using a [employer]      Cypher drug-eluting stent.  3. Enrollment in the Saturn study.   DISPOSITION:  The patient will be treated with dual antiplatelet  therapy, minimum recommendation would be for one year, then following  that at the discretion of the caring cardiologist.      Arturo Morton. Riley Kill, MD, [facility]  Electronically Signed     TDS/MEDQ  D:  11/27/2006  T:  11/27/2006  Job:  [id number]   cc:   [name]  CV Lab

## 2010-12-12 NOTE — Assessment & Plan Note (Signed)
[facility]                            CARDIOLOGY OFFICE NOTE   [name], [name]                        MRN:          [id number]  DATE:07/06/2008                            DOB:          09/15/1940    PRIMARY CARE PHYSICIAN:  Dr. [name] in [place].   INTERVAL HISTORY:  [name] is a delightful 70 year old male with a history  of hypertension, hyperlipidemia, and obstructive sleep apnea.  He has a  history of 1-vessel coronary artery disease and is status post stenting  of his LAD with Cypher drug-eluting stent in February 2008.  He has  normal LV function.   He returns today for followup.  He is doing great.  He continues to walk  1.5 miles a day without any chest pain or shortness of breath.  His  blood pressure has been well controlled.  He has not had any problems  with his medications.   MEDICATIONS:  1. Aspirin 81.  2. Lexapro 40.  3. SATURN study which is Crestor versus Lipitor.  4. Glucosamine.  5. Centrum Silver.  6. Plavix 75 a day.   PHYSICAL EXAMINATION:  GENERAL:  He is well appearing in no acute  distress.  He ambulates around the clinic without any respiratory  difficulty.  VITAL SIGNS:  Blood pressure initially was 112/64, on manual recheck  98/60.  Heart rate is 65, weight is 223.  HEENT:  Normal.  NECK:  Supple.  No JVD.  Carotids are 2+ bilaterally without any bruits.  There is no lymphadenopathy or thyromegaly.  CARDIAC:  PMI is nondisplaced.  Regular rate and rhythm with an S4.  No  murmur.  LUNGS:  Clear.  ABDOMEN:  Obese, nontender, nondistended.  No hepatosplenomegaly.  No  bruits.  No masses.  Good bowel sounds.  EXTREMITIES:  Warm with no cyanosis, clubbing, or edema.  Good distal  pulses.  NEUROLOGIC:  Alert and oriented x3.  Cranial nerves II through XII are  intact.  Moves all 4 extremities without difficulty.  Affect is  pleasant.   EKG shows sinus rhythm at a rate of 65.  No ST-T wave  abnormalities.   ASSESSMENT AND PLAN:  1. Coronary artery disease.  This is stable.  No evidence of ischemia.      Continue current therapy.  2. Hypertension, well controlled.  3. Hyperlipidemia.  This is followed in the SATURN study.   DISPOSITION:  We will see him back in 6-9 months for routine followup.     Bevelyn Buckles. Bensimhon, MD  Electronically Signed    DRB/MedQ  DD: 07/06/2008  DT: 07/06/2008  Job #: [id number]

## 2010-12-12 NOTE — Assessment & Plan Note (Signed)
[facility]                            CARDIOLOGY OFFICE NOTE   [name], RODRIQUEZ                          MRN:          [id number]  DATE:12/10/2006                            DOB:          November 03, 1940    PRIMARY CARE PHYSICIAN:  Dr. [name] in [place].   INTERVAL HISTORY:  [name] is a delightful 70 year old male with  history of hypertension, hyperlipidemia, and obesity.  He was recently  discharged from the hospital after presenting with unstable angina.  Cardiac catheterization showed normal LV function, left main was normal,  and the LAD had a 90% focal stenosis.  The circumflex and right coronary  artery were essentially normal.  He underwent CYPHER drug-eluting stent  to the LAD with immediate relief of his symptoms.  Also was enrolled in  the SATURN hyperlipidemia trial.   Returns today for a followup.  He is doing quite well with no further  chest pain or dyspnea.  He is not having problems with his cath site.   MEDICATIONS:  1. Aspirin 325.  2. Plavix 75.  3. Lisinopril 10.  4. Centrum Silver.  5. Glucosamine.  6. Saturn study drug which I think is Lipitor versus Crestor.   PHYSICAL EXAMINATION:  He is well-appearing, in no acute distress,  ambulates around the clinic without any respiratory difficulty.  Blood  pressure 143/72, heart rate is 58, weight is 230.  HEENT:  Normal.  NECK:  Supple, there is no JVD, carotids are 2+ bilaterally without any  bruits, there is no lymphadenopathy or thyromegaly.  CARDIAC:  Regular rate and rhythm; no murmurs, rubs, or gallops.  LUNGS:  Clear.  ABDOMEN:  Obese, nontender, nondistended, there is no  hepatosplenomegaly, no bruits, no masses, good bowel sounds.  EXTREMITIES:  Warm with no cyanosis, clubbing, or edema, no rash, distal  pulses are strong, right groin has a very tiny hematoma with no bruits.  NEURO:  Alert and oriented x3, cranial nerves are intact, moves all 4  extremities without difficulty.  Affect is very pleasant.   ASSESSMENT/PLAN:  1. Coronary artery disease status post percutaneous transluminal      coronary angioplasty and stenting of the left anterior descending.      He is doing well, he is on a good regimen.  Given a drug-eluting      stent to the left anterior descending would prefer Plavix      indefinitely, but at least for one year.  2. Hypertension, suboptimally controlled.  Increase lisinopril to 40.      Will check a BMET in one week.  3. Hyperlipidemia, currently enrolled in the SATURN study.  4. Obesity.  I suggested that he be more active with a walking program      and do his best to lose some weight.  We will see him back in 3      months for routine followup.     [name]. Bensimhon, MD     DRB/MedQ  DD: 12/10/2006  DT: 12/10/2006  Job #: [id number]   cc:   [name]

## 2010-12-15 NOTE — H&P (Signed)
[name], Ward NO.:  [id number]   MEDICAL RECORD NO.:  [id number]           PATIENT TYPE:   LOCATION:                                 FACILITY:   PHYSICIAN:  J. Darreld Mclean, M.D.      DATE OF BIRTH:   DATE OF ADMISSION:  DATE OF DISCHARGE:  LH                                HISTORY & PHYSICAL   CHIEF COMPLAINT:  Ganglion cyst.   HISTORY OF PRESENT ILLNESS:  The patient is a 70 year old male with a  ganglion cyst on the right dorsal wrist.  I first saw him in my office with  this problem on October 19, 2003.  He had a small ganglion cyst on the dorsum  of the right wrist between the distal ulnar and distal radius.  I aspirated  at that time and got 1.5 mL of clear fluid.  He came back to the office on  November 01, 2003.  It looked like it was resolved.  I told him to see me back  as needed.  The cyst came back towards the end of April.  I told him to  watch.  I could try aspirating again, but basically it would probably have  to be removed.  He said that he would think about it.  He came back on  June 14, 2004.  At that time, he preferred to go ahead and have surgical  procedure to have this excised.  This is now to be electively done on  June 26, 2004.   PAST MEDICAL HISTORY:  The patient denies heart disease, lung disease,  kidney disease, stroke, paralysis, weakness, hypertension, diabetes, TB,  rheumatic fever, cancer, polio, ulcer disease or circulatory problems.   ALLERGIES:  Denies any allergies.   MEDICATIONS:  He currently takes Centrum Silver multivitamins, as well as  glucosamine.   SOCIAL HISTORY:  He does not smoke.  He does not use alcoholic beverages.  He finishes high school.  Colon [name], D.O., and Delaney [name], M.D.,  are his family doctors.   PAST SURGICAL HISTORY:  Previous surgery includes:  1.  Bladder probe in 1966.  2.  Deviated septum.  3.  Broken wrist.  4.  Colonoscopy earlier this year.   FAMILY HISTORY:   Hypertension run in his mother and his sister.  His  father's side of the family has heart problems.   SOCIAL HISTORY:  The patient is married and [place]   PHYSICAL EXAMINATION:  VITAL SIGNS:  The BP is 130/72, pulse 68,  respirations 16 and afebrile.  HEIGHT:  5 feet 10 inches.  WEIGHT:  224 pounds.  GENERAL APPEARANCE:  He is well developed, alert, cooperative and oriented.  HEENT:  Negative.  NECK:  Supple.  LUNGS:  Clear to P&A.  HEART:  Regular rate and rhythm without murmur.  ABDOMEN:  Soft and nontender.  EXTREMITIES:  The right dorsal wrist has a small ganglion cyst about the  size of a small grape between the distal ulnar and the distal radius.  It is  tender.  It is nonmoveable.  It is not red.  It is not warm.  Other  extremities within normal limits.  Grip strengths are normal.  Reflexes are  normal.  CENTRAL NERVOUS SYSTEM:  Intact.  SKIN:  Intact.   IMPRESSION:  Ganglion cyst of right dorsal wrist.   PLAN:  Excision of ganglion cyst.  I have told him this could recur.  His  laboratories are pending.  The risks and imponderables of the procedure were  discussed.  The patient appears to understand and agrees to the procedure as  outlined.       ___________________________________________  J. Darreld Mclean, M.D.    JWK/MEDQ  D:  06/19/2004  T:  06/19/2004  Job:  [id number]

## 2010-12-15 NOTE — Op Note (Signed)
NAME:  [name], [name]                 ACCOUNT NO.:  [id number]   MEDICAL RECORD NO.:  [id number]          PATIENT TYPE:  AMB   LOCATION:  DAY                           FACILITY:  [facility]   PHYSICIAN:  J. Darreld Mclean, M.D. DATE OF BIRTH:  02-May-1941   DATE OF PROCEDURE:  06/27/2004  DATE OF DISCHARGE:                                 OPERATIVE REPORT   PREOPERATIVE DIAGNOSIS:  Ganglion cyst, right dorsal wrist at the level of  the distal ulna.   POSTOPERATIVE DIAGNOSIS:  Ganglion cyst, right dorsal wrist at the level of  the distal ulna.   PROCEDURE:  Excision of ganglion cyst, right dorsal wrist over the distal  ulna dorsally.   ANESTHESIA:  Bier block.  Please refer to anesthesia record for tourniquet  time.   No drains.   SURGEON:  J. Darreld Mclean, M.D.   ASSISTANT:  Lolita Cram, P.A.-C.   INDICATIONS:  The patient is a 70 year old male with pain and tenderness in  his right wrist.  He has had a ganglion cyst over the distal radius.  It was  tender.  We have aspirated it several months ago and it has recurred.  It  hurts and bothers him, and he would like to have it excised.  I have  explained that this could recur, and he understands that.  His labs are  within normal limits.   DESCRIPTION OF PROCEDURE:  The patient was seen in the holding area.  The  right dorsal wrist at the site of the ganglion was identified.  I placed a  mark over it.  He placed a mark by it.  I talked to him and his family.  The  patient was brought back to the operating room and was given a Bier block  anesthesia, laid supine on the operating room table with a hand table  attached.  The patient prepped and draped in the usual manner.  We again  identified the right wrist area as the correct surgical site.  With sharp  dissection, a small incision was made directly over the cyst at the distal  ulna area.  The cyst was exposed.  It was coming directly off the distal  ulna.  With sharp dissection, the  cyst was removed.  Bovie was used for  hemostasis and also for the area where the cyst originated.  The  subcutaneous tissue reapproximated using 3-0 plain simple interrupted and a  subcutaneous 3-0 nylon was used.  Will  see the patient in the office in approximately 10 days to two weeks.  A  sterile bulky dressing was applied.  The tourniquet deflated in the usual  fashion after Bier block anesthesia.  He went to recovery in good condition.  He will contact me through the office or hospital beeper system for any  difficulty.  He tolerated the procedure well.      JWK/MEDQ  D:  06/27/2004  T:  06/27/2004  Job:  [id number]

## 2011-01-12 ENCOUNTER — Encounter: Payer: Self-pay | Admitting: Internal Medicine

## 2011-01-15 ENCOUNTER — Other Ambulatory Visit: Payer: Self-pay | Admitting: Internal Medicine

## 2011-02-01 ENCOUNTER — Other Ambulatory Visit: Payer: Medicare Other | Admitting: Internal Medicine

## 2011-02-07 ENCOUNTER — Other Ambulatory Visit: Payer: Self-pay | Admitting: Internal Medicine

## 2011-03-06 ENCOUNTER — Ambulatory Visit (AMBULATORY_SURGERY_CENTER): Payer: Medicare Other

## 2011-03-06 VITALS — Ht 71.0 in | Wt 219.5 lb

## 2011-03-06 DIAGNOSIS — Z86010 Personal history of colonic polyps: Secondary | ICD-10-CM

## 2011-03-06 NOTE — Progress Notes (Signed)
Pt came into the office today for his pre-visit prior to his colonoscopy on 03/29/11 with Dr Marina Goodell. Pt was on Plavix and has never been seen in our office before. Pt was rescheduled to see Dr Marina Goodell on 04/04/11 as a new pt. Pt understood. Ulis Rias RN . The pt has signed a medical release form also to get his records from Dr Modesto Charon. Medical release form given to [facility]. Ulis Rias RN

## 2011-03-14 ENCOUNTER — Other Ambulatory Visit: Payer: Self-pay | Admitting: Internal Medicine

## 2011-03-15 ENCOUNTER — Other Ambulatory Visit: Payer: Medicare Other | Admitting: Internal Medicine

## 2011-03-29 ENCOUNTER — Other Ambulatory Visit: Payer: Medicare Other | Admitting: Internal Medicine

## 2011-03-30 ENCOUNTER — Encounter: Payer: Self-pay | Admitting: *Deleted

## 2011-04-04 ENCOUNTER — Encounter: Payer: Self-pay | Admitting: Internal Medicine

## 2011-04-04 ENCOUNTER — Ambulatory Visit (INDEPENDENT_AMBULATORY_CARE_PROVIDER_SITE_OTHER): Payer: Medicare Other | Admitting: Internal Medicine

## 2011-04-04 VITALS — BP 134/76 | HR 80 | Ht 71.0 in | Wt 217.0 lb

## 2011-04-04 DIAGNOSIS — Z86010 Personal history of colonic polyps: Secondary | ICD-10-CM

## 2011-04-04 DIAGNOSIS — I2510 Atherosclerotic heart disease of native coronary artery without angina pectoris: Secondary | ICD-10-CM

## 2011-04-04 MED ORDER — PEG-KCL-NACL-NASULF-NA ASC-C 100 G PO SOLR: 1.0000 | Freq: Once | ORAL | Status: DC

## 2011-04-04 NOTE — Patient Instructions (Signed)
Colon [facility] 04/16/11 1:30 pm arrive at 12:30 pm on 4th floor. Colonoscopy  information given for you to review. Moviprep sent to your pharmacy. We will send your Cardiologist a letter regarding holding your Plavix x 5 days.  Once we get a response, we will contact you.

## 2011-04-04 NOTE — Progress Notes (Signed)
HISTORY OF PRESENT ILLNESS:  [name] is a 70 y.o. male with hypertension, hyperlipidemia, obesity, sleep apnea, and coronary artery disease for which she has undergone prior coronary artery stent placement and has been maintained on aspirin and Plavix therapy. He presents today regarding surveillance colonoscopy. The patient tells but it is had several colonoscopies remotely. Most recently, at least 15 years ago, at [facility]. He does not know the physician of record. Does tell me that he has had colon polyps. His GI review of systems is entirely negative grade review of outside laboratories from this past summer reveals no significant abnormalities. His chronic medical problems have been stable. No family history of colon cancer.  REVIEW OF SYSTEMS:  All non-GI ROS negative except for  Past Medical History  Diagnosis Date  . 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 Other and unspecified hyperlipidemia   . Unspecified essential hypertension   . Obesity, unspecified   . Obstructive sleep apnea (adult) (pediatric)   . Hx of colonic polyps     Past Surgical History  Procedure Date  . Coronary stent placement     Social History [name]  reports that he quit smoking about 40 years ago. His smoking use included Cigarettes. He has never used smokeless tobacco. He reports that he drinks alcohol. He reports that he does not use illicit drugs.  family history includes Heart disease in his father.  There is no history of Colon cancer.  No Known Allergies     PHYSICAL EXAMINATION: Vital signs: BP 134/76  Pulse 80  Ht 5\' 11"  (1.803 m)  Wt 217 lb (98.431 kg)  BMI 30.27 kg/m2  Constitutional: generally well-appearing, no acute distress Psychiatric: alert and oriented x3, cooperative Eyes: extraocular movements intact, anicteric, conjunctiva pink Mouth: oral pharynx moist, no lesions Neck: supple no lymphadenopathy Cardiovascular:  heart regular rate and rhythm, no murmur Lungs: clear to auscultation bilaterally Abdomen: soft, nontender, nondistended, no obvious ascites, no peritoneal signs, normal bowel sounds, no organomegaly Rectal: Deferred until colonoscopy Extremities: no lower extremity edema bilaterally Skin: no lesions on visible extremities Neuro: No focal deficits.   ASSESSMENT:  #1. Personal history of colon polyps. Last colonoscopy elsewhere remotely (greater than 15 years). #2. Multiple medical problems. Stable #3. Coronary artery disease with prior coronary artery stent, currently on Plavix and aspirin   PLAN:  #1. Colonoscopy. The patient is high-risk given his comorbidities and the need to address Plavix therapy.The nature of the procedure, as well as the risks, benefits, and alternatives were carefully and thoroughly reviewed with the patient. Ample time for discussion and questions allowed. The patient understood, was satisfied, and agreed to proceed. Movi prep prescribed. The patient instructed on its use #2. Given the remote nature stent placement, would like to hold Plavix for 5 days prior to the procedure with plans to resume Plavix immediately post procedure. He would stay on aspirin throughout. We will, however, with the patient's cardiologist, Dr. Gala Romney.

## 2011-04-06 ENCOUNTER — Telehealth: Payer: Self-pay

## 2011-04-06 NOTE — Telephone Encounter (Signed)
Pt is scheduled for a colonosocpy with Dr Marina Goodell on 04/16/11.  They want to know if he can stop his Plavix prior to the procedure and how many days to stop it for.  Also, they want to know if he should stay on his ASA or stop it prior to the procedure?

## 2011-04-09 ENCOUNTER — Telehealth: Payer: Self-pay | Admitting: Internal Medicine

## 2011-04-09 NOTE — Telephone Encounter (Signed)
[facility] pharmacy and they had Peg [id number] as the prep.  They filled Miralax instead of Moviprep as prescribed.  I sent patient in a coupon for free sample of Moviprep and explained to him the pharmacy filled it wrong.

## 2011-04-09 NOTE — Telephone Encounter (Signed)
Ok to stop Plavix for 5 days. Resume 48 hours after procedure if he has biopsy. Otherwise can resume immediately post-procedure.

## 2011-04-09 NOTE — Telephone Encounter (Signed)
Patient informed. 

## 2011-04-16 ENCOUNTER — Ambulatory Visit (AMBULATORY_SURGERY_CENTER): Payer: Medicare Other | Admitting: Internal Medicine

## 2011-04-16 ENCOUNTER — Encounter: Payer: Self-pay | Admitting: Internal Medicine

## 2011-04-16 VITALS — BP 122/63 | HR 68 | Temp 98.5°F | Resp 20 | Ht 71.0 in | Wt 217.0 lb

## 2011-04-16 DIAGNOSIS — D126 Benign neoplasm of colon, unspecified: Secondary | ICD-10-CM

## 2011-04-16 DIAGNOSIS — Z1211 Encounter for screening for malignant neoplasm of colon: Secondary | ICD-10-CM

## 2011-04-16 DIAGNOSIS — K5730 Diverticulosis of large intestine without perforation or abscess without bleeding: Secondary | ICD-10-CM

## 2011-04-16 DIAGNOSIS — Z86010 Personal history of colonic polyps: Secondary | ICD-10-CM

## 2011-04-16 MED ORDER — SODIUM CHLORIDE 0.9 % IV SOLN: 500.0000 mL | INTRAVENOUS | Status: DC

## 2011-04-16 NOTE — Progress Notes (Signed)
1320 Awaiting arrival of MD, patient resting with room air saturation 89-92%.HOB elevated at 40 degrees. Patient stating he sleeps with a CPAP, no 02.  Rare unifocal PVC noted.

## 2011-04-16 NOTE — Patient Instructions (Signed)
Please read the handouts given to you by your recovery room nurse.    You may resume your plavix today, your other routine medications.  Please, increase the fiber in your diet due to your diverticulosis.   Your polyp results will be sent to your home within 2 weeks.   If you have any questions or concerns, please call us at [phone].    Thank-you for choosing [place] for your healthcare needs today.

## 2011-04-17 ENCOUNTER — Telehealth: Payer: Self-pay | Admitting: *Deleted

## 2011-04-17 NOTE — Telephone Encounter (Signed)

## 2011-05-01 ENCOUNTER — Other Ambulatory Visit: Payer: Self-pay | Admitting: Internal Medicine

## 2011-05-01 MED ORDER — ATORVASTATIN CALCIUM 40 MG PO TABS: 40.0000 mg | ORAL_TABLET | Freq: Every day | ORAL | Status: DC

## 2011-06-05 ENCOUNTER — Ambulatory Visit (INDEPENDENT_AMBULATORY_CARE_PROVIDER_SITE_OTHER): Payer: Medicare Other | Admitting: Internal Medicine

## 2011-06-05 ENCOUNTER — Encounter: Payer: Self-pay | Admitting: Internal Medicine

## 2011-06-05 VITALS — BP 112/70 | HR 70 | Ht 71.0 in | Wt 218.8 lb

## 2011-06-05 DIAGNOSIS — I2510 Atherosclerotic heart disease of native coronary artery without angina pectoris: Secondary | ICD-10-CM

## 2011-06-05 NOTE — Progress Notes (Signed)
HPI:   [name] is a delightful 70 year old male with a history of hypertension, hyperlipidemia, and obstructive sleep apnea.  He has a history of 1-vessel coronary artery disease and is status post stenting of his LAD with Cypher drug-eluting stent in February 2008.  He has normal LV function.  Admitted for CP in 11/11. Left heart cardiac catheterization revealing patent stent in the proximal left anterior descending, but otherwise, nonobstructive disease and ejection fraction of 55-60% without regional wall motion abnormalities.  Doing great. No CP/SOB. [place] [place] [facility] [facility] [place]. Has lost 15 pounds since February. No problems with bleeding on Plavix. No orthopnea, PND. Using CPAP religiously. No palpitations. Compliant with meds.   Due for lipid check with PCP next month.  In June TC 131 HDL 45 LDL 74 TG 62   ROS: All systems negative except as listed in HPI, PMH and Problem List.  Past Medical History  Diagnosis Date  . 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 Other and unspecified hyperlipidemia   . Unspecified essential hypertension   . Obesity, unspecified   . Obstructive sleep apnea (adult) (pediatric)   . Hx of colonic polyps     Current Outpatient Prescriptions  Medication Sig Dispense Refill  . aspirin 81 MG tablet Take 81 mg by mouth daily.        Marland Kitchen atorvastatin (LIPITOR) 40 MG tablet Take 1 tablet (40 mg total) by mouth daily.  30 tablet  0  . fish oil-omega-3 fatty acids 1000 MG capsule Take 1 g by mouth daily.       . Glucosamine HCl 1500 MG TABS Take 1 tablet by mouth 2 (two) times daily.       . Multiple Vitamins-Minerals (CENTRUM SILVER PO) Take 1 capsule by mouth daily.        . nitroGLYCERIN (NITROSTAT) 0.4 MG SL tablet Place 0.4 mg under the tongue every 5 (five) minutes as needed.        Marland Kitchen PLAVIX 75 MG tablet TAKE 1 TABLET DAILY  30 each  12  . PRINIVIL 40 MG tablet TAKE 1 TABLET DAILY  30 each  6  . Saw  Palmetto 450 MG CAPS Take by mouth. 1 every three days          PHYSICAL EXAM: Filed Vitals:   06/05/11 0837  BP: 112/70  Pulse: 70   General:  Well appearing. No resp difficulty HEENT: normal Neck: supple. JVP flat. Carotids 2+ bilaterally; no bruits. No lymphadenopathy or thryomegaly appreciated. Cor: PMI normal. Regular rate & rhythm. No rubs, gallops or murmurs. Lungs: clear Abdomen: soft, nontender, nondistended. No hepatosplenomegaly. No bruits or masses. Good bowel sounds. Extremities: no cyanosis, clubbing, rash, tr edema Neuro: alert & orientedx3, cranial nerves grossly intact. Moves all 4 extremities w/o difficulty. Affect pleasant.   ECG:  NSR 70 No ST-T wave abnormalities.     ASSESSMENT & PLAN:

## 2011-06-05 NOTE — Assessment & Plan Note (Signed)
Lipids look great. Continue current regimen.  

## 2011-06-05 NOTE — Assessment & Plan Note (Signed)
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 Suggested support hose

## 2011-06-05 NOTE — Patient Instructions (Signed)
The current medical regimen is effective;  continue present plan and medications.  Follow up in 1 year with Dr. Lina Sar.  You will receive a letter in the mail 2 months before you are due.  Please call us when you receive this letter to schedule your follow up appointment.

## 2011-06-05 NOTE — Assessment & Plan Note (Signed)
Doing well. No evidence of ischemia. Continue current regimen. Discussed modifying his workout regiment to include interval training.

## 2011-06-11 ENCOUNTER — Other Ambulatory Visit: Payer: Self-pay | Admitting: Internal Medicine

## 2011-09-10 ENCOUNTER — Telehealth: Payer: Self-pay | Admitting: Internal Medicine

## 2011-09-10 NOTE — Telephone Encounter (Signed)
Pt calling re question on seeing another dr

## 2011-09-10 NOTE — Telephone Encounter (Signed)
Mr Valli and Mrs Kistler are calling to find out if they need to get established with a new cardiologist or if Dr Gala Romney will be keeping them.  Staff message sent to Dr Gala Romney.  Pt notified.

## 2011-09-12 NOTE — Telephone Encounter (Signed)
[name] is requesting an appt to get established with Dr Shirlee Latch. His wife ([phone]) would also like to transfer to Dr Shirlee Latch.  They would like back to back appts so that they can come together.  Dr Gala Romney said both need to transfer to a new cardiologist.  Please let them know the date and time of appt.  Thanks.

## 2011-10-01 ENCOUNTER — Other Ambulatory Visit (HOSPITAL_COMMUNITY): Payer: Self-pay | Admitting: Internal Medicine

## 2011-10-23 ENCOUNTER — Encounter: Payer: Self-pay | Admitting: Cardiology

## 2011-10-23 ENCOUNTER — Ambulatory Visit (INDEPENDENT_AMBULATORY_CARE_PROVIDER_SITE_OTHER): Payer: Medicare Other | Admitting: Cardiology

## 2011-10-23 VITALS — BP 140/60 | HR 88 | Ht 70.0 in | Wt 231.0 lb

## 2011-10-23 DIAGNOSIS — R011 Cardiac murmur, unspecified: Secondary | ICD-10-CM

## 2011-10-23 DIAGNOSIS — E785 Hyperlipidemia, unspecified: Secondary | ICD-10-CM

## 2011-10-23 DIAGNOSIS — I2510 Atherosclerotic heart disease of native coronary artery without angina pectoris: Secondary | ICD-10-CM

## 2011-10-23 NOTE — Patient Instructions (Signed)
Your physician wants you to follow-up in: one year.   You will receive a reminder letter in the mail two months in advance. If you don't receive a letter, please call our office to schedule the follow-up appointment.  Your physician has requested that you have an echocardiogram. Echocardiography is a painless test that uses sound waves to create images of your heart. It provides your doctor with information about the size and shape of your heart and how well your heart's chambers and valves are working. This procedure takes approximately one hour. There are no restrictions for this procedure.  Have Dr Modesto Charon fax your labs to Dr Shirlee Latch next time they are done.  Fax=[phone]

## 2011-10-24 DIAGNOSIS — R011 Cardiac murmur, unspecified: Secondary | ICD-10-CM | POA: Insufficient documentation

## 2011-10-24 NOTE — Progress Notes (Signed)
PCP: Dr. Modesto Charon  71 yo with history of CAD and HTN presents for cardiology followup.  Patient has been seen by Dr. Gala Romney in the past and is seen by me for the first time today.  He had a DES to the LAD in 2/08.  Repeat cath in 11/11 showed patent stent.  No recent chest pain.  He works out for 2.5 hours 3 days a week at [employer].  He walks on the treadmill.  No exertional dyspnea.  BP is borderline elevated today.  He checks it at home and says it stays in the 120s-130s systolic.    Labs (6/12): K 4.7, creatinine 0.7, LDL 74  PMH: 1. HTN 2. Hyperlipidemia 3. OSA: CPAP 4. CAD: Cypher DES in LAD in 2/08.  LHC in 11/11 with patent LAD stent, EF 55-60%.  5. Colonic polyps 6. 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   SH: Married, lives in [place].  2 children.  Retired.  Nonsmoker.    FH: CAD  ROS: All systems reviewed and negative except as per HPI.   Current Outpatient Prescriptions  Medication Sig Dispense Refill  . aspirin 81 MG tablet Take 81 mg by mouth daily.        Marland Kitchen atorvastatin (LIPITOR) 40 MG tablet Take 1 tablet (40 mg total) by mouth daily.  30 tablet  0  . fish oil-omega-3 fatty acids 1000 MG capsule Take 1 g by mouth daily.       . Glucosamine HCl 1500 MG TABS Take 1 tablet by mouth 2 (two) times daily.       Marland Kitchen LIPITOR 80 MG tablet TAKE (1/2) TABLET DAILY.  15 each  11  . Multiple Vitamins-Minerals (CENTRUM SILVER PO) Take 1 capsule by mouth daily.        . nitroGLYCERIN (NITROSTAT) 0.4 MG SL tablet Place 0.4 mg under the tongue every 5 (five) minutes as needed.        Marland Kitchen PLAVIX 75 MG tablet TAKE 1 TABLET DAILY  30 each  12  . Saw Palmetto 450 MG CAPS Take by mouth. 1 every three days       . PRINIVIL 40 MG tablet TAKE 1 TABLET DAILY  30 each  5    BP 140/60  Pulse 88  Ht 5\' 10"  (1.778 m)  Wt 231 lb (104.781 kg)  BMI 33.15 kg/m2 General: NAD Neck: No JVD, no thyromegaly or thyroid nodule.  Lungs: Clear to auscultation bilaterally with normal respiratory  effort. CV: Nondisplaced PMI.  Heart regular S1/S2, no S3/S4, 2/6 systolic crescendo-decrescendo murmur RUSB.  1+ ankle edema bilaterally.  No carotid bruit.  Normal pedal pulses.  Abdomen: Soft, nontender, no hepatosplenomegaly, no distention.  Neurologic: Alert and oriented x 3.  Psych: Normal affect. Extremities: No clubbing or cyanosis.

## 2011-10-24 NOTE — Assessment & Plan Note (Signed)
Goal LDL < 70.  He will have lipids with PCP in 5/13.  He will ask them to send me a copy.

## 2011-10-24 NOTE — Assessment & Plan Note (Signed)
Stable with no ischemic symptoms.  Continue ASA 81, Plavix 75, statin, ACEI.

## 2011-10-24 NOTE — Assessment & Plan Note (Signed)
Aortic area systolic murmur in RUSB.  Aortic sclerosis in 2008.  I will get an echocardiogram to reassess the aortic valve.

## 2011-10-30 ENCOUNTER — Ambulatory Visit (HOSPITAL_COMMUNITY): Payer: Medicare Other | Attending: Cardiology

## 2011-10-30 ENCOUNTER — Other Ambulatory Visit: Payer: Self-pay

## 2011-10-30 DIAGNOSIS — R011 Cardiac murmur, unspecified: Secondary | ICD-10-CM

## 2011-10-30 DIAGNOSIS — E669 Obesity, unspecified: Secondary | ICD-10-CM | POA: Insufficient documentation

## 2011-10-30 DIAGNOSIS — I2510 Atherosclerotic heart disease of native coronary artery without angina pectoris: Secondary | ICD-10-CM | POA: Insufficient documentation

## 2011-10-30 DIAGNOSIS — E785 Hyperlipidemia, unspecified: Secondary | ICD-10-CM | POA: Insufficient documentation

## 2011-12-14 ENCOUNTER — Encounter: Payer: Self-pay | Admitting: Cardiology

## 2012-01-10 ENCOUNTER — Encounter: Payer: Self-pay | Admitting: Cardiology

## 2012-03-29 ENCOUNTER — Other Ambulatory Visit (HOSPITAL_COMMUNITY): Payer: Self-pay | Admitting: Internal Medicine

## 2012-04-02 ENCOUNTER — Other Ambulatory Visit (HOSPITAL_COMMUNITY): Payer: Self-pay | Admitting: Internal Medicine

## 2012-05-06 ENCOUNTER — Other Ambulatory Visit: Payer: Self-pay

## 2012-05-07 ENCOUNTER — Other Ambulatory Visit: Payer: Self-pay

## 2012-05-07 MED ORDER — ATORVASTATIN CALCIUM 40 MG PO TABS: 40.0000 mg | ORAL_TABLET | Freq: Every day | ORAL | Status: DC

## 2012-07-17 ENCOUNTER — Encounter: Payer: Self-pay | Admitting: Cardiovascular Disease

## 2012-08-22 ENCOUNTER — Other Ambulatory Visit: Payer: Self-pay | Admitting: Cardiology

## 2012-11-06 ENCOUNTER — Other Ambulatory Visit (INDEPENDENT_AMBULATORY_CARE_PROVIDER_SITE_OTHER): Payer: Medicare Other

## 2012-11-06 DIAGNOSIS — E785 Hyperlipidemia, unspecified: Secondary | ICD-10-CM

## 2012-11-06 DIAGNOSIS — E559 Vitamin D deficiency, unspecified: Secondary | ICD-10-CM

## 2012-11-06 DIAGNOSIS — I10 Essential (primary) hypertension: Secondary | ICD-10-CM

## 2012-11-06 LAB — COMPREHENSIVE METABOLIC PANEL
ALT: 47 U/L (ref 0–53)
AST: 57 U/L — ABNORMAL HIGH (ref 0–37)
Albumin: 3.8 g/dL (ref 3.5–5.2)
Alkaline Phosphatase: 111 U/L (ref 39–117)
BUN: 14 mg/dL (ref 6–23)
CO2: 23 mEq/L (ref 19–32)
Calcium: 9 mg/dL (ref 8.4–10.5)
Chloride: 102 mEq/L (ref 96–112)
Creat: 0.94 mg/dL (ref 0.50–1.35)
Glucose, Bld: 94 mg/dL (ref 70–99)
Potassium: 4.5 mEq/L (ref 3.5–5.3)
Sodium: 138 mEq/L (ref 135–145)
Total Bilirubin: 0.7 mg/dL (ref 0.3–1.2)
Total Protein: 5.9 g/dL — ABNORMAL LOW (ref 6.0–8.3)

## 2012-11-06 NOTE — Progress Notes (Signed)
Patient came in for labs only.

## 2012-11-07 ENCOUNTER — Other Ambulatory Visit: Payer: Self-pay | Admitting: Internal Medicine

## 2012-11-07 LAB — NMR LIPOPROFILE WITH LIPIDS
Cholesterol, Total: 138 mg/dL (ref <200)
HDL Particle Number: 36.3 umol/L (ref >=30.5)
HDL Size: 9 nm — ABNORMAL LOW (ref >=9.2)
HDL-C: 42 mg/dL (ref >=40)
LDL (calc): 76 mg/dL (ref <100)
LDL Particle Number: 1329 nmol/L — ABNORMAL HIGH (ref <1000)
LDL Size: 20.4 nm — ABNORMAL LOW (ref >20.5)
LP-IR Score: 74 — ABNORMAL HIGH (ref <=45)
Large HDL-P: 3.5 umol/L — ABNORMAL LOW (ref >=4.8)
Large VLDL-P: 4.4 nmol/L — ABNORMAL HIGH (ref <=2.7)
Small LDL Particle Number: 767 nmol/L — ABNORMAL HIGH (ref <=527)
Triglycerides: 98 mg/dL (ref <150)
VLDL Size: 61.3 nm — ABNORMAL HIGH (ref <=46.6)

## 2012-11-07 LAB — VITAMIN D 25 HYDROXY (VIT D DEFICIENCY, FRACTURES): Vit D, 25-Hydroxy: 46 ng/mL (ref 30–89)

## 2012-11-07 NOTE — Progress Notes (Signed)
Quick Note:  Labs abnormal.transaminase elevated. Labs not at goal. Needs office visit to review, ______

## 2012-11-10 ENCOUNTER — Telehealth: Payer: Self-pay | Admitting: Family Medicine

## 2012-11-10 NOTE — Telephone Encounter (Signed)
Pt was called to remind him of appointment date.

## 2012-11-11 ENCOUNTER — Other Ambulatory Visit: Payer: Self-pay | Admitting: Internal Medicine

## 2012-11-11 ENCOUNTER — Ambulatory Visit (INDEPENDENT_AMBULATORY_CARE_PROVIDER_SITE_OTHER): Payer: Medicare Other | Admitting: Family Medicine

## 2012-11-11 ENCOUNTER — Encounter: Payer: Self-pay | Admitting: Family Medicine

## 2012-11-11 VITALS — BP 126/66 | HR 74 | Temp 98.1°F | Ht 71.0 in | Wt 225.8 lb

## 2012-11-11 DIAGNOSIS — G4733 Obstructive sleep apnea (adult) (pediatric): Secondary | ICD-10-CM

## 2012-11-11 DIAGNOSIS — E8881 Metabolic syndrome: Secondary | ICD-10-CM

## 2012-11-11 DIAGNOSIS — I2510 Atherosclerotic heart disease of native coronary artery without angina pectoris: Secondary | ICD-10-CM

## 2012-11-11 DIAGNOSIS — K649 Unspecified hemorrhoids: Secondary | ICD-10-CM | POA: Insufficient documentation

## 2012-11-11 DIAGNOSIS — I10 Essential (primary) hypertension: Secondary | ICD-10-CM

## 2012-11-11 DIAGNOSIS — E785 Hyperlipidemia, unspecified: Secondary | ICD-10-CM

## 2012-11-11 DIAGNOSIS — E669 Obesity, unspecified: Secondary | ICD-10-CM

## 2012-11-11 HISTORY — DX: Metabolic syndrome: E88.810

## 2012-11-11 HISTORY — DX: Metabolic syndrome: E88.81

## 2012-11-11 LAB — POCT CBC
Granulocyte percent: 67.3 %G (ref 37–80)
HCT, POC: 37.7 % — AB (ref 43.5–53.7)
Hemoglobin: 12.9 g/dL — AB (ref 14.1–18.1)
Lymph, poc: 2 (ref 0.6–3.4)
MCH, POC: 35.3 pg — AB (ref 27–31.2)
MCHC: 34.4 g/dL (ref 31.8–35.4)
MCV: 102.7 fL — AB (ref 80–97)
MPV: 7.2 fL (ref 0–99.8)
POC Granulocyte: 5.2 (ref 2–6.9)
POC LYMPH PERCENT: 25.5 %L (ref 10–50)
Platelet Count, POC: 162 10*3/uL (ref 142–424)
RBC: 3.7 M/uL — AB (ref 4.69–6.13)
RDW, POC: 13.4 %
WBC: 7.8 10*3/uL (ref 4.6–10.2)

## 2012-11-11 MED ORDER — HYDROCORTISONE ACE-PRAMOXINE 1-1 % RE FOAM: 1.0000 | Freq: Three times a day (TID) | RECTAL | Status: AC

## 2012-11-11 NOTE — Patient Instructions (Addendum)
Dr Woodroe Mode Recommendations  Diet and Exercise discussed with patient.  For nutrition information, I recommend books:  1).Eat to Live by Dr Monico Hoar. 2).Prevent and Reverse Heart Disease by Dr Suzzette Righter.  Exercise recommendations are:  If unable to walk, then the patient can exercise in a chair 3 times a day. By flapping arms like a bird gently and raising legs outwards to the front.  If ambulatory, the patient can go for walks for 30 minutes 3 times a week. Then increase the intensity and duration as tolerated.  Goal is to try to attain exercise frequency to 5 times a week.  If applicable: Best to perform resistance exercises (machines or weights) 2 days a week and cardio type exercises 3 days per week. Hemorrhoids Hemorrhoids are enlarged (dilated) veins around the rectum. There are 2 types of hemorrhoids, and the type of hemorrhoid is determined by its location. Internal hemorrhoids occur in the veins just inside the rectum.They are usually not painful, but they may bleed.However, they may poke through to the outside and become irritated and painful. External hemorrhoids involve the veins outside the anus and can be felt as a painful swelling or hard lump near the anus.They are often itchy and may crack and bleed. Sometimes clots will form in the veins. This makes them swollen and painful. These are called thrombosed hemorrhoids. CAUSES Causes of hemorrhoids include:  Pregnancy. This increases the pressure in the hemorrhoidal veins.  Constipation.  Straining to have a bowel movement.  Obesity.  Heavy lifting or other activity that caused you to strain. TREATMENT Most of the time hemorrhoids improve in 1 to 2 weeks. However, if symptoms do not seem to be getting better or if you have a lot of rectal bleeding, your caregiver may perform a procedure to help make the hemorrhoids get smaller or remove them completely.Possible treatments include:  Rubber  band ligation. A rubber band is placed at the base of the hemorrhoid to cut off the circulation.  Sclerotherapy. A chemical is injected to shrink the hemorrhoid.  Infrared light therapy. Tools are used to burn the hemorrhoid.  Hemorrhoidectomy. This is surgical removal of the hemorrhoid. HOME CARE INSTRUCTIONS   Increase fiber in your diet. Ask your caregiver about using fiber supplements.  Drink enough water and fluids to keep your urine clear or pale yellow.  Exercise regularly.  Go to the bathroom when you have the urge to have a bowel movement. Do not wait.  Avoid straining to have bowel movements.  Keep the anal area dry and clean.  Only take over-the-counter or prescription medicines for pain, discomfort, or fever as directed by your caregiver. If your hemorrhoids are thrombosed:  Take warm sitz baths for 20 to 30 minutes, 3 to 4 times per day.  If the hemorrhoids are very tender and swollen, place ice packs on the area as tolerated. Using ice packs between sitz baths may be helpful. Fill a plastic bag with ice. Place a towel between the bag of ice and your skin.  Medicated creams and suppositories may be used or applied as directed.  Do not use a donut-shaped pillow or sit on the toilet for long periods. This increases blood pooling and pain. SEEK MEDICAL CARE IF:   You have increasing pain and swelling that is not controlled with your medicine.  You have uncontrolled bleeding.  You have difficulty or you are unable to have a bowel movement.  You have pain or inflammation  outside the area of the hemorrhoids.  You have chills or an oral temperature above 102 F (38.9 C). MAKE SURE YOU:   Understand these instructions.  Will watch your condition.  Will get help right away if you are not doing well or get worse. Document Released: 07/13/2000 Document Revised: 10/08/2011 Document Reviewed: 06/26/2010 [facility] Patient Information 2013 [place], [place].

## 2012-11-11 NOTE — Progress Notes (Signed)
Patient ID: [name], male   DOB: [birth year], 72 y.o.   MRN: [id number] SUBJECTIVE: HPI: Rectal discomfort and bleeding. Pain to sit down. Has had a colonoscopy. The bleeding in the toilet was limited to a couple of drops and on the toilet paper. Has not had this before. Never had bleeding hemorrhoids. Came for follow up of the other medical problems. Patient is here for follow up of hyperlipidemia/hypertension/CAD/metabolic syndrome.: denies Headache;denies Chest Pain;denies weakness;denies Shortness of Breath and orthopnea;denies Visual changes;denies palpitations;denies cough;denies pedal edema;denies symptoms of TIA or stroke;deniesClaudication symptoms. admits to Compliance with medications; denies Problems with medications.    PMH/PSH: reviewed/updated in Epic  SH/FH: reviewed/updated in Epic  Allergies: reviewed/updated in Epic  Medications: reviewed/updated in Epic  Immunizations: reviewed/updated in Epic  ROS: As above in the HPI. All other systems are stable or negative.  OBJECTIVE: APPEARANCE:  Patient in no acute distress.The patient appeared well nourished and normally developed. Acyanotic. Waist: VITAL SIGNS:BP 126/66  Pulse 74  Temp(Src) 98.1 F (36.7 C) (Oral)  Ht 5\' 11"  (1.803 m)  Wt 225 lb 12.8 oz (102.422 kg)  BMI 31.51 kg/m2   SKIN: warm and  Dry without overt rashes, tattoos and scars  HEAD and Neck: without JVD, Head and scalp: normal Eyes:No scleral icterus. Fundi normal, eye movements normal. Ears: Auricle normal, canal normal, Tympanic membranes normal, insufflation normal. Nose: normal Throat: normal Neck & thyroid: normal  CHEST & LUNGS: Chest wall: normal Lungs: Clear  CVS: Reveals the PMI to be normally located. Regular rhythm, First and Second Heart sounds are normal,  absence of murmurs, rubs or gallops. Peripheral vasculature: Radial pulses: normal Dorsal pedis pulses: normal Posterior pulses: normal  ABDOMEN:   Appearance: normal Benign,, no organomegaly, no masses, no Abdominal Aortic enlargement. No Guarding , no rebound. No Bruits. Bowel sounds: normal  RECTAL: large hemorrhoid at 3 o'clock.not actively bleeding. Hemorrhoid isTender, not thrombosed. No rectal masses. Prostate moderately enlarged no nodules. Brown stool heme positive. GU: N/A  EXTREMETIES: nonedematous. Both Femoral and Pedal pulses are normal.  MUSCULOSKELETAL:  Spine: normal Joints: intact  NEUROLOGIC: oriented to time,place and person; nonfocal. Strength is normal Sensory is normal Reflexes are normal Cranial Nerves are normal.  ASSESSMENT: Acute hemorrhoid - bleeding hemorrhoid - Plan: POCT CBC  CAD - stable  HYPERLIPIDEMIA - stable  HYPERTENSION - BP at goal  OBESITY - could benefit from weight loss  SLEEP APNEA, OBSTRUCTIVE - stable  Metabolic syndrome - discussed with patient weight loss need to diet and exercise.long term sequela    PLAN: Orders Placed This Encounter  Procedures  . POCT CBC   Results for orders placed in visit on 11/11/12 (from the past 24 hour(s))  POCT CBC     Status: Abnormal   Collection Time    11/11/12 10:08 AM      Result Value Range   WBC 7.8  4.6 - 10.2 K/uL   Lymph, poc 2.0  0.6 - 3.4   POC LYMPH PERCENT 25.5  10 - 50 %L   POC Granulocyte 5.2  2 - 6.9   Granulocyte percent 67.3  37 - 80 %G   RBC 3.7 (*) 4.69 - 6.13 M/uL   Hemoglobin 12.9 (*) 14.1 - 18.1 g/dL   HCT, POC 14.7 (*) 82.9 - 53.7 %   MCV 102.7 (*) 80 - 97 fL   MCH, POC 35.3 (*) 27 - 31.2 pg   MCHC 34.4  31.8 - 35.4 g/dL   RDW, POC 13.4  Platelet Count, POC 162.0  142 - 424 K/uL   MPV 7.2  0 - 99.8 fL  previous HGB was 12.8. Patient at baseline. Sitz baths.       Dr Woodroe Mode Recommendations  Diet and Exercise discussed with patient.  For nutrition information, I recommend books:  1).Eat to Live by Dr Monico Hoar. 2).Prevent and Reverse Heart Disease by Dr Suzzette Righter.  Exercise recommendations are:  If unable to walk, then the patient can exercise in a chair 3 times a day. By flapping arms like a bird gently and raising legs outwards to the front.  If ambulatory, the patient can go for walks for 30 minutes 3 times a week. Then increase the intensity and duration as tolerated.  Goal is to try to attain exercise frequency to 5 times a week.  If applicable: Best to perform resistance exercises (machines or weights) 2 days a week and cardio type exercises 3 days per week. Hemorrhoids Hemorrhoids are enlarged (dilated) veins around the rectum. There are 2 types of hemorrhoids, and the type of hemorrhoid is determined by its location. Internal hemorrhoids occur in the veins just inside the rectum.They are usually not painful, but they may bleed.However, they may poke through to the outside and become irritated and painful. External hemorrhoids involve the veins outside the anus and can be felt as a painful swelling or hard lump near the anus.They are often itchy and may crack and bleed. Sometimes clots will form in the veins. This makes them swollen and painful. These are called thrombosed hemorrhoids. CAUSES Causes of hemorrhoids include:  Pregnancy. This increases the pressure in the hemorrhoidal veins.  Constipation.  Straining to have a bowel movement.  Obesity.  Heavy lifting or other activity that caused you to strain. TREATMENT Most of the time hemorrhoids improve in 1 to 2 weeks. However, if symptoms do not seem to be getting better or if you have a lot of rectal bleeding, your caregiver may perform a procedure to help make the hemorrhoids get smaller or remove them completely.Possible treatments include:  Rubber band ligation. A rubber band is placed at the base of the hemorrhoid to cut off the circulation.  Sclerotherapy. A chemical is injected to shrink the hemorrhoid.  Infrared light therapy. Tools are used to burn the  hemorrhoid.  Hemorrhoidectomy. This is surgical removal of the hemorrhoid. HOME CARE INSTRUCTIONS   Increase fiber in your diet. Ask your caregiver about using fiber supplements.  Drink enough water and fluids to keep your urine clear or pale yellow.  Exercise regularly.  Go to the bathroom when you have the urge to have a bowel movement. Do not wait.  Avoid straining to have bowel movements.  Keep the anal area dry and clean.  Only take over-the-counter or prescription medicines for pain, discomfort, or fever as directed by your caregiver. If your hemorrhoids are thrombosed:  Take warm sitz baths for 20 to 30 minutes, 3 to 4 times per day.  If the hemorrhoids are very tender and swollen, place ice packs on the area as tolerated. Using ice packs between sitz baths may be helpful. Fill a plastic bag with ice. Place a towel between the bag of ice and your skin.  Medicated creams and suppositories may be used or applied as directed.  Do not use a donut-shaped pillow or sit on the toilet for long periods. This increases blood pooling and pain. SEEK MEDICAL CARE IF:   You have increasing pain and swelling  that is not controlled with your medicine.  You have uncontrolled bleeding.  You have difficulty or you are unable to have a bowel movement.  You have pain or inflammation outside the area of the hemorrhoids.  You have chills or an oral temperature above 102 F (38.9 C). MAKE SURE YOU:   Understand these instructions.  Will watch your condition.  Will get help right away if you are not doing well or get worse. Document Released: 07/13/2000 Document Revised: 10/08/2011 Document Reviewed: 06/26/2010 [facility] Patient Information 2013 [place], [place].  Meds ordered this encounter  Medications  . hydrocortisone-pramoxine (PROCTOFOAM HC) rectal foam    Sig: Place 1 applicator rectally 3 (three) times daily.    Dispense:  10 g    Refill:  1  . hydrocortisone-pramoxine  (ANALPRAM-HC) 2.5-1 % rectal cream    Sig: Place rectally 3 (three) times daily.   RTC in 2 weeks to recheck or before. Reviewed his labs of 11/06/2012. With patient discussed his  Diet and exercise and need for fiber in  Diet.Thelma Barge P. [place] Charon, M.D.

## 2012-11-11 NOTE — Assessment & Plan Note (Signed)
Rx proctofoam HC  Apply tid for 2 weeks. Sitz baths tid. RTc 2 weeks Check CBC

## 2012-11-25 ENCOUNTER — Ambulatory Visit (INDEPENDENT_AMBULATORY_CARE_PROVIDER_SITE_OTHER): Payer: Medicare Other | Admitting: Family Medicine

## 2012-11-25 ENCOUNTER — Encounter: Payer: Self-pay | Admitting: Family Medicine

## 2012-11-25 VITALS — BP 124/64 | HR 87 | Temp 98.3°F | Wt 223.6 lb

## 2012-11-25 DIAGNOSIS — R748 Abnormal levels of other serum enzymes: Secondary | ICD-10-CM | POA: Insufficient documentation

## 2012-11-25 DIAGNOSIS — R7402 Elevation of levels of lactic acid dehydrogenase (LDH): Secondary | ICD-10-CM

## 2012-11-25 DIAGNOSIS — E785 Hyperlipidemia, unspecified: Secondary | ICD-10-CM

## 2012-11-25 DIAGNOSIS — K649 Unspecified hemorrhoids: Secondary | ICD-10-CM

## 2012-11-25 DIAGNOSIS — R7401 Elevation of levels of liver transaminase levels: Secondary | ICD-10-CM

## 2012-11-25 NOTE — Progress Notes (Signed)
Patient ID: [name], male   DOB: 1940-11-19, 72 y.o.   MRN: [id number] SUBJECTIVE: HPI: Here to recheck his hemorrhoid. Still alittle sore but better. OTC products work just as well as prescription.  PMH/PSH: reviewed/updated in Epic  SH/FH: reviewed/updated in Epic  Allergies: reviewed/updated in Epic  Medications: reviewed/updated in Epic  Immunizations: reviewed/updated in Epic  ROS: As above in the HPI. All other systems are stable or negative.  OBJECTIVE: APPEARANCE:  Patient in no acute distress.The patient appeared well nourished and normally developed. Acyanotic. Waist: VITAL SIGNS:BP 124/64  Pulse 87  Temp(Src) 98.3 F (36.8 C) (Oral)  Wt 223 lb 9.6 oz (101.424 kg)  BMI 31.2 kg/m2   SKIN: warm and  Dry without overt rashes, tattoos and scars  HEAD and Neck: without JVD, Head and scalp: normal Eyes:No scleral icterus. Fundi normal, eye movements normal. Ears: Auricle normal, canal normal, Tympanic membranes normal, insufflation normal. Nose: normal Throat: normal Neck & thyroid: normal  CHEST & LUNGS: Chest wall: normal Lungs: Clear  CVS: Reveals the PMI to be normally located. Regular rhythm, First and Second Heart sounds are normal,  absence of murmurs, rubs or gallops. Peripheral vasculature: Radial pulses: normal Dorsal pedis pulses: normal Posterior pulses: normal  ABDOMEN:  Appearance: normal Benign,, no organomegaly, no masses, no Abdominal Aortic enlargement. No Guarding , no rebound. No Bruits. Bowel sounds: normal  RECTAL: hemorrhoids have shrunken 50%. No thrombosis. Mild discomfort. No blood.  GU: N/A  EXTREMETIES: nonedematous.   ASSESSMENT: Acute hemorrhoid - 50% better  HYPERLIPIDEMIA  Abnormal transaminases  Reviewed his labs with him. Patient diferred any further work up for now especially with the elevated transaminases.  PLAN:  No orders of the defined types were placed in this encounter.   No orders of the  defined types were placed in this encounter.    RTc in 3 months. Diet weight loss exercise reviewed,  [name] P. [place] Charon, M.D.

## 2013-02-21 ENCOUNTER — Other Ambulatory Visit: Payer: Self-pay | Admitting: Cardiology

## 2013-03-03 ENCOUNTER — Other Ambulatory Visit: Payer: Medicare Other

## 2013-03-05 ENCOUNTER — Encounter: Payer: Self-pay | Admitting: Family Medicine

## 2013-03-05 ENCOUNTER — Ambulatory Visit (INDEPENDENT_AMBULATORY_CARE_PROVIDER_SITE_OTHER): Payer: Medicare Other | Admitting: Family Medicine

## 2013-03-05 VITALS — BP 104/62 | HR 70 | Temp 97.8°F | Wt 227.0 lb

## 2013-03-05 DIAGNOSIS — R7402 Elevation of levels of lactic acid dehydrogenase (LDH): Secondary | ICD-10-CM

## 2013-03-05 DIAGNOSIS — E8881 Metabolic syndrome: Secondary | ICD-10-CM

## 2013-03-05 DIAGNOSIS — R748 Abnormal levels of other serum enzymes: Secondary | ICD-10-CM

## 2013-03-05 DIAGNOSIS — I10 Essential (primary) hypertension: Secondary | ICD-10-CM

## 2013-03-05 DIAGNOSIS — R609 Edema, unspecified: Secondary | ICD-10-CM

## 2013-03-05 DIAGNOSIS — K649 Unspecified hemorrhoids: Secondary | ICD-10-CM

## 2013-03-05 DIAGNOSIS — G4733 Obstructive sleep apnea (adult) (pediatric): Secondary | ICD-10-CM

## 2013-03-05 DIAGNOSIS — R7401 Elevation of levels of liver transaminase levels: Secondary | ICD-10-CM

## 2013-03-05 DIAGNOSIS — E785 Hyperlipidemia, unspecified: Secondary | ICD-10-CM

## 2013-03-05 DIAGNOSIS — I2510 Atherosclerotic heart disease of native coronary artery without angina pectoris: Secondary | ICD-10-CM

## 2013-03-05 NOTE — Patient Instructions (Addendum)
Dr Woodroe Mode Recommendations  Diet and Exercise discussed with patient.  For nutrition information, I recommend books:  1).Eat to Live by Dr Monico Hoar. 2).Prevent and Reverse Heart Disease by Dr Suzzette Righter. 3) Dr Katherina Right Book:  Program to Reverse Diabetes  Exercise recommendations are:  If unable to walk, then the patient can exercise in a chair 3 times a day. By flapping arms like a bird gently and raising legs outwards to the front.  If ambulatory, the patient can go for walks for 30 minutes 3 times a week. Then increase the intensity and duration as tolerated.  Goal is to try to attain exercise frequency to 5 times a week.  If applicable: Best to perform resistance exercises (machines or weights) 2 days a week and cardio type exercises 3 days per week.   Hemorrhoids Hemorrhoids are swollen veins around the rectum or anus. There are two types of hemorrhoids:   Internal hemorrhoids. These occur in the veins just inside the rectum. They may poke through to the outside and become irritated and painful.  External hemorrhoids. These occur in the veins outside the anus and can be felt as a painful swelling or hard lump near the anus. CAUSES  Pregnancy.   Obesity.   Constipation or diarrhea.   Straining to have a bowel movement.   Sitting for long periods on the toilet.  Heavy lifting or other activity that caused you to strain.  Anal intercourse. SYMPTOMS   Pain.   Anal itching or irritation.   Rectal bleeding.   Fecal leakage.   Anal swelling.   One or more lumps around the anus.  DIAGNOSIS  Your caregiver may be able to diagnose hemorrhoids by visual examination. Other examinations or tests that may be performed include:   Examination of the rectal area with a gloved hand (digital rectal exam).   Examination of anal canal using a small tube (scope).   A blood test if you have lost a significant amount of blood.  A  test to look inside the colon (sigmoidoscopy or colonoscopy). TREATMENT Most hemorrhoids can be treated at home. However, if symptoms do not seem to be getting better or if you have a lot of rectal bleeding, your caregiver may perform a procedure to help make the hemorrhoids get smaller or remove them completely. Possible treatments include:   Placing a rubber band at the base of the hemorrhoid to cut off the circulation (rubber band ligation).   Injecting a chemical to shrink the hemorrhoid (sclerotherapy).   Using a tool to burn the hemorrhoid (infrared light therapy).   Surgically removing the hemorrhoid (hemorrhoidectomy).   Stapling the hemorrhoid to block blood flow to the tissue (hemorrhoid stapling).  HOME CARE INSTRUCTIONS   Eat foods with fiber, such as whole grains, beans, nuts, fruits, and vegetables. Ask your doctor about taking products with added fiber in them (fibersupplements).  Increase fluid intake. Drink enough water and fluids to keep your urine clear or pale yellow.   Exercise regularly.   Go to the bathroom when you have the urge to have a bowel movement. Do not wait.   Avoid straining to have bowel movements.   Keep the anal area dry and clean. Use wet toilet paper or moist towelettes after a bowel movement.   Medicated creams and suppositories may be used or applied as directed.   Only take over-the-counter or prescription medicines as directed by your caregiver.   Take warm sitz  baths for 15 20 minutes, 3 4 times a day to ease pain and discomfort.   Place ice packs on the hemorrhoids if they are tender and swollen. Using ice packs between sitz baths may be helpful.   Put ice in a plastic bag.   Place a towel between your skin and the bag.   Leave the ice on for 15 20 minutes, 3 4 times a day.   Do not use a donut-shaped pillow or sit on the toilet for long periods. This increases blood pooling and pain.  SEEK MEDICAL CARE  IF:  You have increasing pain and swelling that is not controlled by treatment or medicine.  You have uncontrolled bleeding.  You have difficulty or you are unable to have a bowel movement.  You have pain or inflammation outside the area of the hemorrhoids. MAKE SURE YOU:  Understand these instructions.  Will watch your condition.  Will get help right away if you are not doing well or get worse. Document Released: 07/13/2000 Document Revised: 07/02/2012 Document Reviewed: 05/20/2012 [facility] Patient Information 2014 [place], [place].

## 2013-03-05 NOTE — Progress Notes (Signed)
Patient ID: [name], male   DOB: 04-04-1941, 72 y.o.   MRN: [id number] SUBJECTIVE: CC: Chief Complaint  Patient presents with  . Follow-up    3 month follow up refills needed     HPI: Breakfast: bowl of cheerios and banana, or an egg sandwich Lunch: usually doesn't have lunch. Tomato sandwich Supper: sandwich tomato. Drinks water.  Exercise 3 days a week.  Patient is here for follow up of hyperlipidemia:/HTN/CAD denies Headache;denies Chest Pain;denies weakness;denies Shortness of Breath and orthopnea;denies Visual changes;denies palpitations;denies cough;denies pedal edema;denies symptoms of TIA or stroke;deniesClaudication symptoms. admits to Compliance with medications; denies Problems with medications.  Past Medical History  Diagnosis Date  . 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 Other and unspecified hyperlipidemia   . Unspecified essential hypertension   . Obesity, unspecified   . Obstructive sleep apnea (adult) (pediatric)   . Hx of colonic polyps    Past Surgical History  Procedure Laterality Date  . Coronary stent placement    . Colonoscopy    . Polypectomy     History   Social History  . Marital Status: Married    Spouse Name: N/A    Number of Children: 1  . Years of Education: N/A   Occupational History  . Marine scientist     Retired    Social History Main Topics  . Smoking status: Former Smoker    Types: Cigarettes    Quit date: 03/06/1971  . Smokeless tobacco: Never Used  . Alcohol Use: Yes     Comment: 1 drink daily   . Drug Use: No  . Sexually Active: Not on file   Other Topics Concern  . Not on file   Social History Narrative   0 caffeine drinks    Family History  Problem Relation Age of Onset  . Colon cancer Neg Hx   . Heart disease Father     and Multiple family members on father Side   . Irritable bowel syndrome Maternal Grandmother    Current Outpatient Prescriptions on File Prior to  Visit  Medication Sig Dispense Refill  . aspirin 81 MG tablet Take 81 mg by mouth daily.        Marland Kitchen atorvastatin (LIPITOR) 40 MG tablet TAKE 1 TABLET DAILY  90 tablet  1  . fish oil-omega-3 fatty acids 1000 MG capsule Take 1 g by mouth daily.       . Glucosamine HCl 1500 MG TABS Take 1 tablet by mouth 2 (two) times daily.       Marland Kitchen lisinopril (PRINIVIL,ZESTRIL) 40 MG tablet TAKE 1 TABLET DAILY  30 tablet  2  . Multiple Vitamins-Minerals (CENTRUM SILVER PO) Take 1 capsule by mouth daily.        . nitroGLYCERIN (NITROSTAT) 0.4 MG SL tablet Place 0.4 mg under the tongue every 5 (five) minutes as needed.        Marland Kitchen PLAVIX 75 MG tablet TAKE 1 TABLET DAILY  30 each  12  . Saw Palmetto 450 MG CAPS Take by mouth. 1 every three days        No current facility-administered medications on file prior to visit.   No Known Allergies Immunization History  Administered Date(s) Administered  . Pneumococcal Polysaccharide 06/09/2010  . Tdap 01/12/2011   Prior to Admission medications   Medication Sig Start Date End Date Taking? Authorizing Provider  aspirin 81 MG tablet Take 81 mg by mouth daily.  Yes Historical Provider, MD  atorvastatin (LIPITOR) 40 MG tablet TAKE 1 TABLET DAILY 08/22/12  Yes Laurey Morale, MD  fish oil-omega-3 fatty acids 1000 MG capsule Take 1 g by mouth daily.    Yes Historical Provider, MD  Glucosamine HCl 1500 MG TABS Take 1 tablet by mouth 2 (two) times daily.    Yes Historical Provider, MD  lisinopril (PRINIVIL,ZESTRIL) 40 MG tablet TAKE 1 TABLET DAILY 11/11/12  Yes Laurey Morale, MD  Multiple Vitamins-Minerals (CENTRUM SILVER PO) Take 1 capsule by mouth daily.     Yes Historical Provider, MD  nitroGLYCERIN (NITROSTAT) 0.4 MG SL tablet Place 0.4 mg under the tongue every 5 (five) minutes as needed.     Yes Historical Provider, MD  PLAVIX 75 MG tablet TAKE 1 TABLET DAILY 03/29/12  Yes Dolores Patty, MD  Saw Palmetto 450 MG CAPS Take by mouth. 1 every three days    Yes  Historical Provider, MD     ROS: As above in the HPI. All other systems are stable or negative.  OBJECTIVE: APPEARANCE:  Patient in no acute distress.The patient appeared well nourished and normally developed. Acyanotic. Waist: VITAL SIGNS:BP 104/62  Pulse 70  Temp(Src) 97.8 F (36.6 C) (Oral)  Wt 227 lb (102.967 kg)  BMI 31.67 kg/m2 WM Obese  SKIN: warm and  Dry without overt rashes, tattoos and scars  HEAD and Neck: without JVD, Head and scalp: normal Eyes:No scleral icterus. Fundi normal, eye movements normal. Ears: Auricle normal, canal normal, Tympanic membranes normal, insufflation normal. Nose: normal Throat: normal Neck & thyroid: normal  CHEST & LUNGS: Chest wall: normal Lungs: Clear  CVS: Reveals the PMI to be normally located. Regular rhythm, First and Second Heart sounds are normal,  absence of murmurs, rubs or gallops. Peripheral vasculature: Radial pulses: normal Dorsal pedis pulses: normal Posterior pulses: normal  ABDOMEN:  Appearance: Obese Benign, no organomegaly, no masses, no Abdominal Aortic enlargement. No Guarding , no rebound. No Bruits. Bowel sounds: normal  RECTAL: 2 hemorrhoids at the 3 o'clock and 9 o'clock position. Patient declines digital examination. No active bleeding GU: N/A  EXTREMETIES: 1+edematous. Both Femoral and Pedal pulses are normal.  MUSCULOSKELETAL:  Spine: normal Joints: intact  NEUROLOGIC: oriented to time,place and person; nonfocal. Strength is normal Sensory is normal Reflexes are normal Cranial Nerves are normal.  ASSESSMENT: HYPERLIPIDEMIA - Plan: CMP14+EGFR, Lipid panel, CANCELED: NMR, lipoprofile  CAD - Plan: CANCELED: NMR, lipoprofile  HYPERTENSION - Plan: CMP14+EGFR  Abnormal transaminases  Metabolic syndrome  SLEEP APNEA, OBSTRUCTIVE  Edema  Acute hemorrhoid - Plan: Ambulatory referral to General Surgery   PLAN:  Orders Placed This Encounter  Procedures  . CMP14+EGFR  .  Lipid panel  . Ambulatory referral to General Surgery    Referral Priority:  Routine    Referral Type:  Surgical    Referral Reason:  Specialty Services Required    Requested Specialty:  General Surgery    Number of Visits Requested:  1   Results for orders placed in visit on 11/11/12  POCT CBC      Result Value Range   WBC 7.8  4.6 - 10.2 K/uL   Lymph, poc 2.0  0.6 - 3.4   POC LYMPH PERCENT 25.5  10 - 50 %L   POC Granulocyte 5.2  2 - 6.9   Granulocyte percent 67.3  37 - 80 %G   RBC 3.7 (*) 4.69 - 6.13 M/uL   Hemoglobin 12.9 (*) 14.1 - 18.1 g/dL  HCT, POC 37.7 (*) 43.5 - 53.7 %   MCV 102.7 (*) 80 - 97 fL   MCH, POC 35.3 (*) 27 - 31.2 pg   MCHC 34.4  31.8 - 35.4 g/dL   RDW, POC 16.1     Platelet Count, POC 162.0  142 - 424 K/uL   MPV 7.2  0 - 99.8 fL    No change in medications. Continue the OTC hemorrhoidal medication.  Handout on hemorrhoidal care in the AVS for patient.       Dr Woodroe Mode Recommendations  Diet and Exercise discussed with patient.  For nutrition information, I recommend books:  1).Eat to Live by Dr Monico Hoar. 2).Prevent and Reverse Heart Disease by Dr Suzzette Righter. 3) Dr Katherina Right Book:  Program to Reverse Diabetes  Exercise recommendations are:  If unable to walk, then the patient can exercise in a chair 3 times a day. By flapping arms like a bird gently and raising legs outwards to the front.  If ambulatory, the patient can go for walks for 30 minutes 3 times a week. Then increase the intensity and duration as tolerated.  Goal is to try to attain exercise frequency to 5 times a week.  If applicable: Best to perform resistance exercises (machines or weights) 2 days a week and cardio type exercises 3 days per week.   Return in about 4 months (around 07/05/2013) for Recheck medical problems.  Areyana Leoni P. [place] Charon, M.D.

## 2013-03-07 LAB — CMP14+EGFR
ALT: 64 IU/L — ABNORMAL HIGH (ref 0–44)
AST: 80 IU/L — ABNORMAL HIGH (ref 0–40)
Albumin/Globulin Ratio: 1.5 (ref 1.1–2.5)
Albumin: 3.7 g/dL (ref 3.5–4.8)
Alkaline Phosphatase: 153 IU/L — ABNORMAL HIGH (ref 39–117)
BUN/Creatinine Ratio: 12 (ref 10–22)
BUN: 11 mg/dL (ref 8–27)
CO2: 24 mmol/L (ref 18–29)
Calcium: 9 mg/dL (ref 8.6–10.2)
Chloride: 103 mmol/L (ref 97–108)
Creatinine, Ser: 0.94 mg/dL (ref 0.76–1.27)
GFR calc Af Amer: 94 mL/min/{1.73_m2} (ref >59)
GFR calc non Af Amer: 81 mL/min/{1.73_m2} (ref >59)
Globulin, Total: 2.5 g/dL (ref 1.5–4.5)
Glucose: 106 mg/dL — ABNORMAL HIGH (ref 65–99)
Potassium: 4.8 mmol/L (ref 3.5–5.2)
Sodium: 141 mmol/L (ref 134–144)
Total Bilirubin: 0.6 mg/dL (ref 0.0–1.2)
Total Protein: 6.2 g/dL (ref 6.0–8.5)

## 2013-03-07 LAB — LIPID PANEL WITH LDL/HDL RATIO
Cholesterol, Total: 135 mg/dL (ref 100–199)
HDL: 48 mg/dL (ref >39)
LDL Calculated: 69 mg/dL (ref 0–99)
LDl/HDL Ratio: 1.4 ratio units (ref 0.0–3.6)
Triglycerides: 90 mg/dL (ref 0–149)
VLDL Cholesterol Cal: 18 mg/dL (ref 5–40)

## 2013-03-07 LAB — SPECIMEN STATUS REPORT

## 2013-03-13 ENCOUNTER — Other Ambulatory Visit: Payer: Self-pay | Admitting: Cardiology

## 2013-03-15 NOTE — Progress Notes (Signed)
Quick Note:  Labs abnormal. Liver enzymes are higher. Avoid all alcohol, and return for an office visit in 2 weeks to recheck the liver. Consider doing a liver ultrasound. ______

## 2013-03-18 ENCOUNTER — Ambulatory Visit (INDEPENDENT_AMBULATORY_CARE_PROVIDER_SITE_OTHER): Payer: Medicare Other | Admitting: General Surgery

## 2013-03-18 ENCOUNTER — Encounter (INDEPENDENT_AMBULATORY_CARE_PROVIDER_SITE_OTHER): Payer: Self-pay | Admitting: General Surgery

## 2013-03-18 VITALS — BP 138/66 | HR 72 | Resp 14 | Ht 71.0 in | Wt 228.2 lb

## 2013-03-18 DIAGNOSIS — K648 Other hemorrhoids: Secondary | ICD-10-CM

## 2013-03-18 LAB — LIPID PANEL
Chol/HDL Ratio: 2.8 ratio units (ref 0.0–5.0)
Cholesterol, Total: 136 mg/dL (ref 100–199)
HDL: 48 mg/dL (ref >39)
LDL Calculated: 71 mg/dL (ref 0–99)
Triglycerides: 84 mg/dL (ref 0–149)
VLDL Cholesterol Cal: 17 mg/dL (ref 5–40)

## 2013-03-18 NOTE — Progress Notes (Signed)
Patient ID: [name], male   DOB: 08-Sep-1940, 72 y.o.   MRN: [id number]  Chief Complaint  Patient presents with  . New Evaluation    eval hems    HPI [name] is a 72 y.o. male.  We are asked to see the patient in consultation by Dr. Modesto Charon to evaluate him for hemorrhoids. The patient is a 72 year old white male who states that he will periodically notice a small amount of blood with his bowel movements. This occurs about once a week. He states it is very minor. He denies any rectal pain. He does have a history of cardiac stents having been placed in does take Plavix on a regular basis. HPI  Past Medical History  Diagnosis Date  . 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 Other and unspecified hyperlipidemia   . Unspecified essential hypertension   . Obesity, unspecified   . Obstructive sleep apnea (adult) (pediatric)   . Hx of colonic polyps     Past Surgical History  Procedure Laterality Date  . Coronary stent placement    . Colonoscopy    . Polypectomy      Family History  Problem Relation Age of Onset  . Colon cancer Neg Hx   . Heart disease Father     and Multiple family members on father Side   . Irritable bowel syndrome Maternal Grandmother     Social History History  Substance Use Topics  . Smoking status: Former Smoker    Types: Cigarettes    Quit date: 03/06/1971  . Smokeless tobacco: Never Used  . Alcohol Use: Yes     Comment: 1 drink daily     No Known Allergies  Current Outpatient Prescriptions  Medication Sig Dispense Refill  . aspirin 81 MG tablet Take 81 mg by mouth daily.        Marland Kitchen atorvastatin (LIPITOR) 40 MG tablet TAKE 1 TABLET ONCE A DAY  90 tablet  0  . fish oil-omega-3 fatty acids 1000 MG capsule Take 1 g by mouth daily.       . Glucosamine HCl 1500 MG TABS Take 1 tablet by mouth 2 (two) times daily.       Marland Kitchen lisinopril (PRINIVIL,ZESTRIL) 40 MG tablet TAKE 1 TABLET DAILY  30 tablet  2  . Multiple  Vitamins-Minerals (CENTRUM SILVER PO) Take 1 capsule by mouth daily.        . nitroGLYCERIN (NITROSTAT) 0.4 MG SL tablet Place 0.4 mg under the tongue every 5 (five) minutes as needed.        Marland Kitchen PLAVIX 75 MG tablet TAKE 1 TABLET DAILY  30 each  12  . Saw Palmetto 450 MG CAPS Take by mouth. 1 every three days        No current facility-administered medications for this visit.    Review of Systems Review of Systems  Constitutional: Negative.   HENT: Negative.   Eyes: Negative.   Respiratory: Negative.   Cardiovascular: Negative.   Gastrointestinal: Positive for blood in stool.  Endocrine: Negative.   Genitourinary: Negative.   Musculoskeletal: Negative.   Skin: Negative.   Allergic/Immunologic: Negative.   Neurological: Negative.   Hematological: Negative.   Psychiatric/Behavioral: Negative.     Blood pressure 138/66, pulse 72, resp. rate 14, height 5\' 11"  (1.803 m), weight 228 lb 3.2 oz (103.511 kg).  Physical Exam Physical Exam  Constitutional: He is oriented to person, place, and time. He appears well-developed and well-nourished.  HENT:  Head: Normocephalic and atraumatic.  Eyes: Conjunctivae and EOM are normal. Pupils are equal, round, and reactive to light.  Neck: Normal range of motion. Neck supple.  Cardiovascular: Normal rate, regular rhythm and normal heart sounds.   Pulmonary/Chest: Effort normal and breath sounds normal.  Abdominal: Soft. Bowel sounds are normal. There is no tenderness.  Genitourinary: Rectum normal.  There are only one or 2 very tiny external hemorrhoidal skin tags. He has good rectal tone on digital exam. There is no palpable mass. On anoscopic exam he has mild internal hemorrhoidal tissue but no areas of inflamed tissue  Musculoskeletal: Normal range of motion.  Neurological: He is alert and oriented to person, place, and time.  Skin: Skin is warm and dry.  Psychiatric: He has a normal mood and affect. His behavior is normal.    Data  Reviewed As above  Assessment    The patient appears to have mild internal hemorrhoidal disease that is probably the source of some of the bleeding he has noted. Since his symptoms are very mild I think his options for treatment would include close observation versus internal hemorrhoid banding or internal hemorrhoid injection of sclerosing agent. I have discussed the different options with him. He would need to be off Plavix for several days prior to doing any of these interventions. At this point he favors close observation. He will call us if the bleeding flares up again.    Plan    At this point the patient favors observation and agrees to call if the bleeding flares up again.        TOTH III,Kimberley Dastrup S 03/18/2013, 4:08 PM

## 2013-03-18 NOTE — Patient Instructions (Signed)
May use preparation H or hydrocortisone cream to rectum as needed Call if bleeding flares up again

## 2013-04-03 ENCOUNTER — Ambulatory Visit (INDEPENDENT_AMBULATORY_CARE_PROVIDER_SITE_OTHER): Payer: Medicare Other | Admitting: Family Medicine

## 2013-04-03 ENCOUNTER — Encounter: Payer: Self-pay | Admitting: Family Medicine

## 2013-04-03 VITALS — BP 112/63 | HR 63 | Temp 97.2°F | Ht 71.0 in | Wt 220.4 lb

## 2013-04-03 DIAGNOSIS — E8881 Metabolic syndrome: Secondary | ICD-10-CM

## 2013-04-03 DIAGNOSIS — R7402 Elevation of levels of lactic acid dehydrogenase (LDH): Secondary | ICD-10-CM

## 2013-04-03 DIAGNOSIS — I2510 Atherosclerotic heart disease of native coronary artery without angina pectoris: Secondary | ICD-10-CM

## 2013-04-03 DIAGNOSIS — R748 Abnormal levels of other serum enzymes: Secondary | ICD-10-CM

## 2013-04-03 DIAGNOSIS — E669 Obesity, unspecified: Secondary | ICD-10-CM

## 2013-04-03 DIAGNOSIS — E785 Hyperlipidemia, unspecified: Secondary | ICD-10-CM

## 2013-04-03 DIAGNOSIS — G4733 Obstructive sleep apnea (adult) (pediatric): Secondary | ICD-10-CM

## 2013-04-03 DIAGNOSIS — R7401 Elevation of levels of liver transaminase levels: Secondary | ICD-10-CM

## 2013-04-03 NOTE — Progress Notes (Signed)
Patient ID: [name], male   DOB: [birth year], 72 y.o.   MRN: [id number] SUBJECTIVE: CC: Chief Complaint  Patient presents with  . Follow-up    LFT higher     HPI: Elevated LFTs, no alcohol or tylenol. Only taking his medications. Here for follow.  Past Medical History  Diagnosis Date  . 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 Other and unspecified hyperlipidemia   . Unspecified essential hypertension   . Obesity, unspecified   . Obstructive sleep apnea (adult) (pediatric)   . Hx of colonic polyps    Past Surgical History  Procedure Laterality Date  . Coronary stent placement    . Colonoscopy    . Polypectomy     History   Social History  . Marital Status: Married    Spouse Name: N/A    Number of Children: 1  . Years of Education: N/A   Occupational History  . Marine scientist     Retired    Social History Main Topics  . Smoking status: Former Smoker    Types: Cigarettes    Quit date: 03/06/1971  . Smokeless tobacco: Never Used  . Alcohol Use: Yes     Comment: 1 drink daily   . Drug Use: No  . Sexual Activity: Not on file   Other Topics Concern  . Not on file   Social History Narrative   0 caffeine drinks    Family History  Problem Relation Age of Onset  . Colon cancer Neg Hx   . Heart disease Father     and Multiple family members on father Side   . Irritable bowel syndrome Maternal Grandmother    Current Outpatient Prescriptions on File Prior to Visit  Medication Sig Dispense Refill  . aspirin 81 MG tablet Take 81 mg by mouth daily.        Marland Kitchen atorvastatin (LIPITOR) 40 MG tablet TAKE 1 TABLET ONCE A DAY  90 tablet  0  . fish oil-omega-3 fatty acids 1000 MG capsule Take 1 g by mouth daily.       . Glucosamine HCl 1500 MG TABS Take 1 tablet by mouth 2 (two) times daily.       Marland Kitchen lisinopril (PRINIVIL,ZESTRIL) 40 MG tablet TAKE 1 TABLET DAILY  30 tablet  2  . Multiple Vitamins-Minerals (CENTRUM SILVER PO) Take 1  capsule by mouth daily.        . nitroGLYCERIN (NITROSTAT) 0.4 MG SL tablet Place 0.4 mg under the tongue every 5 (five) minutes as needed.        Marland Kitchen PLAVIX 75 MG tablet TAKE 1 TABLET DAILY  30 each  12  . Saw Palmetto 450 MG CAPS Take by mouth. 1 every three days        No current facility-administered medications on file prior to visit.   No Known Allergies Immunization History  Administered Date(s) Administered  . Pneumococcal Polysaccharide 06/09/2010  . Tdap 01/12/2011   Prior to Admission medications   Medication Sig Start Date End Date Taking? Authorizing Provider  aspirin 81 MG tablet Take 81 mg by mouth daily.     Yes Historical Provider, MD  atorvastatin (LIPITOR) 40 MG tablet TAKE 1 TABLET ONCE A DAY 03/13/13  Yes Laurey Morale, MD  fish oil-omega-3 fatty acids 1000 MG capsule Take 1 g by mouth daily.    Yes Historical Provider, MD  Glucosamine HCl 1500 MG TABS Take 1 tablet by mouth 2 (two)  times daily.    Yes Historical Provider, MD  lisinopril (PRINIVIL,ZESTRIL) 40 MG tablet TAKE 1 TABLET DAILY 11/11/12  Yes Laurey Morale, MD  Multiple Vitamins-Minerals (CENTRUM SILVER PO) Take 1 capsule by mouth daily.     Yes Historical Provider, MD  nitroGLYCERIN (NITROSTAT) 0.4 MG SL tablet Place 0.4 mg under the tongue every 5 (five) minutes as needed.     Yes Historical Provider, MD  PLAVIX 75 MG tablet TAKE 1 TABLET DAILY 03/29/12  Yes Dolores Patty, MD  Saw Palmetto 450 MG CAPS Take by mouth. 1 every three days    Yes Historical Provider, MD      ROS: As above in the HPI. All other systems are stable or negative.  OBJECTIVE: APPEARANCE:  Patient in no acute distress.The patient appeared well nourished and normally developed. Acyanotic. Waist: VITAL SIGNS:BP 112/63  Pulse 63  Temp(Src) 97.2 F (36.2 C) (Oral)  Ht 5\' 11"  (1.803 m)  Wt 220 lb 6.4 oz (99.973 kg)  BMI 30.75 kg/m2 WM  SKIN: warm and  Dry without overt rashes, tattoos and scars  HEAD and Neck:  without JVD, Head and scalp: normal Eyes:No scleral icterus. Fundi normal, eye movements normal. Ears: Auricle normal, canal normal, Tympanic membranes normal, insufflation normal. Nose: normal Throat: normal Neck & thyroid: normal  CHEST & LUNGS: Chest wall: normal Lungs: Clear  CVS: Reveals the PMI to be normally located. Regular rhythm, First and Second Heart sounds are normal,  absence of murmurs, rubs or gallops. Peripheral vasculature: Radial pulses: normal Dorsal pedis pulses: normal Posterior pulses: normal  ABDOMEN:  Appearance: normal Benign, no organomegaly, no masses, no Abdominal Aortic enlargement. No Guarding , no rebound. No Bruits. Bowel sounds: normal  RECTAL: N/A GU: N/A  EXTREMETIES: nonedematous.  MUSCULOSKELETAL:  Spine: normal Joints: intact  NEUROLOGIC: oriented to time,place and person; nonfocal. Strength is normal Sensory is normal Reflexes are normal Cranial Nerves are normal.  Results for orders placed in visit on 03/05/13  LIPID PANEL      Result Value Range   Cholesterol, Total 136  100 - 199 mg/dL   Triglycerides 84  0 - 149 mg/dL   HDL 48  >16 mg/dL   VLDL Cholesterol Cal 17  5 - 40 mg/dL   LDL Calculated 71  0 - 99 mg/dL   Chol/HDL Ratio 2.8  0.0 - 5.0 ratio units  CMP14+EGFR      Result Value Range   Glucose 106 (*) 65 - 99 mg/dL   BUN 11  8 - 27 mg/dL   Creatinine, Ser 1.09  0.76 - 1.27 mg/dL   GFR calc non Af Amer 81  >59 mL/min/1.73   GFR calc Af Amer 94  >59 mL/min/1.73   BUN/Creatinine Ratio 12  10 - 22   Sodium 141  134 - 144 mmol/L   Potassium 4.8  3.5 - 5.2 mmol/L   Chloride 103  97 - 108 mmol/L   CO2 24  18 - 29 mmol/L   Calcium 9.0  8.6 - 10.2 mg/dL   Total Protein 6.2  6.0 - 8.5 g/dL   Albumin 3.7  3.5 - 4.8 g/dL   Globulin, Total 2.5  1.5 - 4.5 g/dL   Albumin/Globulin Ratio 1.5  1.1 - 2.5   Total Bilirubin 0.6  0.0 - 1.2 mg/dL   Alkaline Phosphatase 153 (*) 39 - 117 IU/L   AST 80 (*) 0 - 40 IU/L   ALT  64 (*) 0 - 44 IU/L  LIPID PANEL WITH LDL/HDL RATIO      Result Value Range   Cholesterol, Total 135  100 - 199 mg/dL   Triglycerides 90  0 - 149 mg/dL   HDL 48  >08 mg/dL   VLDL Cholesterol Cal 18  5 - 40 mg/dL   LDL Calculated 69  0 - 99 mg/dL   LDl/HDL Ratio 1.4  0.0 - 3.6 ratio units  SPECIMEN STATUS REPORT      Result Value Range   specimen status report Comment      ASSESSMENT: Abnormal transaminases - Plan: Hepatic function panel, Hepatitis panel, acute  CAD  HYPERLIPIDEMIA  Metabolic syndrome  OBESITY  SLEEP APNEA, OBSTRUCTIVE   PLAN: Orders Placed This Encounter  Procedures  . Hepatic function panel  . Hepatitis panel, acute    Reviewed labs with patient.  Consider U/s of liver and other work up pending todays labs.  Return pending labs.   Kaiyon Hynes P. Modesto Charon, M.D.

## 2013-04-04 LAB — HEPATIC FUNCTION PANEL
ALT: 61 [IU]/L — ABNORMAL HIGH (ref 0–44)
AST: 83 [IU]/L — ABNORMAL HIGH (ref 0–40)
Albumin: 4 g/dL (ref 3.5–4.8)
Alkaline Phosphatase: 136 [IU]/L — ABNORMAL HIGH (ref 39–117)
Bilirubin, Direct: 0.28 mg/dL (ref 0.00–0.40)
Total Bilirubin: 0.8 mg/dL (ref 0.0–1.2)
Total Protein: 6.1 g/dL (ref 6.0–8.5)

## 2013-04-04 LAB — HEPATITIS PANEL, ACUTE
Hep A IgM: NEGATIVE
Hep B C IgM: NEGATIVE
Hep C Virus Ab: <0.1 s/co ratio (ref 0.0–0.9)
Hepatitis B Surface Ag: NEGATIVE

## 2013-04-08 ENCOUNTER — Other Ambulatory Visit: Payer: Self-pay | Admitting: Family Medicine

## 2013-04-08 DIAGNOSIS — R748 Abnormal levels of other serum enzymes: Secondary | ICD-10-CM

## 2013-04-08 NOTE — Progress Notes (Signed)
Quick Note:  Labs abnormal. Needs additional lab work and U/S of the liver. Ordered in EPIC ______

## 2013-04-14 ENCOUNTER — Telehealth: Payer: Self-pay | Admitting: Family Medicine

## 2013-04-14 NOTE — Telephone Encounter (Signed)
Patient aware of his results a little upset that no one had called him when fpw nurse told him he would get a call last week explained to p[atient sorry that it took so long and he seemed to understand

## 2013-04-16 ENCOUNTER — Ambulatory Visit (HOSPITAL_COMMUNITY)
Admission: RE | Admit: 2013-04-16 | Discharge: 2013-04-16 | Disposition: A | Discharge status: Home / Self Care | Payer: Medicare Other | Source: Ambulatory Visit | Attending: Family Medicine | Admitting: Family Medicine

## 2013-04-16 DIAGNOSIS — R160 Hepatomegaly, not elsewhere classified: Secondary | ICD-10-CM | POA: Insufficient documentation

## 2013-04-16 DIAGNOSIS — R748 Abnormal levels of other serum enzymes: Secondary | ICD-10-CM

## 2013-04-16 DIAGNOSIS — R7402 Elevation of levels of lactic acid dehydrogenase (LDH): Secondary | ICD-10-CM | POA: Insufficient documentation

## 2013-04-16 DIAGNOSIS — R7401 Elevation of levels of liver transaminase levels: Secondary | ICD-10-CM | POA: Insufficient documentation

## 2013-04-17 NOTE — Progress Notes (Signed)
Quick Note:  Labs abnormal. Fatty liver. Needs aggressive nutrition intervention.  Fatty liver is now the number one cause for cirrhosis. Appointment with Tammy to review diet and medications in view of his dyslipidemia and statin use. I called and left a message on his voice mail that I tried to get in touch.  ______

## 2013-04-20 ENCOUNTER — Telehealth: Payer: Self-pay | Admitting: Family Medicine

## 2013-04-20 NOTE — Progress Notes (Signed)
Pt aware of results and has an appt w Tammy

## 2013-04-22 ENCOUNTER — Ambulatory Visit (INDEPENDENT_AMBULATORY_CARE_PROVIDER_SITE_OTHER): Payer: Medicare Other | Admitting: Pharmacist

## 2013-04-22 ENCOUNTER — Encounter: Payer: Self-pay | Admitting: Pharmacist

## 2013-04-22 VITALS — Ht 71.0 in | Wt 223.5 lb

## 2013-04-22 DIAGNOSIS — R748 Abnormal levels of other serum enzymes: Secondary | ICD-10-CM

## 2013-04-22 DIAGNOSIS — R7401 Elevation of levels of liver transaminase levels: Secondary | ICD-10-CM

## 2013-04-22 DIAGNOSIS — K7689 Other specified diseases of liver: Secondary | ICD-10-CM

## 2013-04-22 DIAGNOSIS — K760 Fatty (change of) liver, not elsewhere classified: Secondary | ICD-10-CM | POA: Insufficient documentation

## 2013-04-22 NOTE — Progress Notes (Signed)
  Subjective:    Patient ID: [name], male    DOB: February 03, 1941, 72 y.o.   MRN: [id number]  HPI Patient is referred by Dr Leodis Sias for diet and medication review.  He recently had labs performed and LFT elevations were noted.    Hepatitis panel was normal and Abdominal Ultrasound demonstrated fatty liver infiltrates.  In reviewing his past LFTs it appears he has had slightly elevated LFTs off and on in the past 2 years.   No history of risk factors for liver disease were noted.  Patient denies blood transfusion, use of illicit drugs, unprotected  sex, family history  of chronic liver disease, en utero exposure to hepatotrophic virus, foreign travel and family history of genetic liver disease (hemochromatosis, CF, galactosemia) or exposure to carbamazepine, phenytoin, valproate, ethanol, street drugs.     Current Medications:  Atorvastatin 40mg  daily (has taken since stent placed about 4-5 years ago), Plavix 75mg  daily (has taken since stent placed) Glucosamine BID, saw palmetto every 3 days for prostate, fish oil 1 capsule / 1000mg  daily, ASA 81mg  daily, lisinopril 40mg  daily, MVI daily and NTG SL as needed for CP (hasn't needed in several years)   Review of Systems None      Objective:   Physical Exam  Body mass index is 31.19 kg/(m^2). Filed Weights   04/22/13 1313  Weight: 223 lb 8 oz (101.379 kg)     Laboratory Lab Results  Component Value Date   WBC 7.8 11/11/2012   WBC 6.3 06/20/2010   RBC 3.7* 11/11/2012   RBC 3.83* 06/20/2010   HGB 12.9* 11/11/2012   HGB 12.8* 06/20/2010   Lab Results  Component Value Date   GLUCOSE 106* 03/05/2013   GLUCOSE 94 11/06/2012   BUN 11 03/05/2013   BUN 14 11/06/2012   CREATININE 0.94 03/05/2013   CREATININE 0.94 11/06/2012   CO2 24 03/05/2013   ALBUMIN 3.8 11/06/2012   CALCIUM 9.0 03/05/2013   liver function tests from 03/05/2013 Alk Phos - 153 AST - 80 ALT - 64     Assessment:    Elevated LFTs 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   Plan:   Recheck Liver panel today as well as other labs requested by Dr Modesto Charon Dicussed ways to decrease fat in diet (limit animal products, switch to low fat milk and increase fresh vegetable and fruits.   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 Hepatic function panel    Henrene Pastor, PharmD, CPP

## 2013-04-23 LAB — FERRITIN: Ferritin: 81 ng/mL (ref 30–400)

## 2013-04-23 LAB — ALPHA-1-ANTITRYPSIN: A-1 Antitrypsin: 157 mg/dL (ref 90–200)

## 2013-04-23 LAB — CERULOPLASMIN: Ceruloplasmin: 20.7 mg/dL (ref 15.0–30.0)

## 2013-04-23 LAB — ANA: Anti Nuclear Antibody(ANA): NEGATIVE

## 2013-04-27 NOTE — Progress Notes (Signed)
Quick Note:  Call patient. Labs normal for other causes of liver diseases: ANA, ferritin, ceruloplasmin, alpha1antitrypsin. No change in plan.  a repeat hepatic panel is due? Pending in labs? ______

## 2013-04-28 NOTE — Telephone Encounter (Signed)
Pt.notified

## 2013-04-29 ENCOUNTER — Other Ambulatory Visit: Payer: Self-pay | Admitting: Internal Medicine

## 2013-04-30 ENCOUNTER — Other Ambulatory Visit: Payer: Self-pay | Admitting: Pharmacist

## 2013-04-30 DIAGNOSIS — R7989 Other specified abnormal findings of blood chemistry: Secondary | ICD-10-CM

## 2013-06-01 ENCOUNTER — Other Ambulatory Visit (INDEPENDENT_AMBULATORY_CARE_PROVIDER_SITE_OTHER): Payer: Medicare Other

## 2013-06-01 DIAGNOSIS — R7989 Other specified abnormal findings of blood chemistry: Secondary | ICD-10-CM

## 2013-06-01 NOTE — Progress Notes (Signed)
Patient came in for labs only.

## 2013-06-02 LAB — HEPATIC FUNCTION PANEL
ALT: 41 IU/L (ref 0–44)
AST: 55 IU/L — ABNORMAL HIGH (ref 0–40)
Albumin: 3.7 g/dL (ref 3.5–4.8)
Alkaline Phosphatase: 139 IU/L — ABNORMAL HIGH (ref 39–117)
Bilirubin, Direct: 0.21 mg/dL (ref 0.00–0.40)
Total Bilirubin: 0.6 mg/dL (ref 0.0–1.2)
Total Protein: 6 g/dL (ref 6.0–8.5)

## 2013-06-03 NOTE — Progress Notes (Signed)
Quick Note:  Call Patient Labs abnormal:Liver panel actually improved a bit  Recommendations: Continue with dietary recommendations as per Tammy. Follow up in 2 months to recheck weight BP and exam and repeat liver panel.   ______

## 2013-06-09 NOTE — Patient Instructions (Signed)
Lab results given to pt Verbalizes understanding

## 2013-06-24 ENCOUNTER — Other Ambulatory Visit: Payer: Self-pay | Admitting: Cardiology

## 2013-07-02 ENCOUNTER — Other Ambulatory Visit: Payer: Self-pay | Admitting: Cardiology

## 2013-07-07 ENCOUNTER — Encounter: Payer: Self-pay | Admitting: Family Medicine

## 2013-07-07 ENCOUNTER — Ambulatory Visit (INDEPENDENT_AMBULATORY_CARE_PROVIDER_SITE_OTHER): Payer: Medicare Other | Admitting: Family Medicine

## 2013-07-07 VITALS — BP 132/60 | HR 70 | Temp 97.6°F | Ht 71.0 in | Wt 226.0 lb

## 2013-07-07 DIAGNOSIS — H6980 Other specified disorders of Eustachian tube, unspecified ear: Secondary | ICD-10-CM

## 2013-07-07 DIAGNOSIS — R748 Abnormal levels of other serum enzymes: Secondary | ICD-10-CM

## 2013-07-07 DIAGNOSIS — H6990 Unspecified Eustachian tube disorder, unspecified ear: Secondary | ICD-10-CM

## 2013-07-07 DIAGNOSIS — R739 Hyperglycemia, unspecified: Secondary | ICD-10-CM

## 2013-07-07 DIAGNOSIS — R7402 Elevation of levels of lactic acid dehydrogenase (LDH): Secondary | ICD-10-CM

## 2013-07-07 DIAGNOSIS — E8881 Metabolic syndrome: Secondary | ICD-10-CM

## 2013-07-07 DIAGNOSIS — H6993 Unspecified Eustachian tube disorder, bilateral: Secondary | ICD-10-CM

## 2013-07-07 DIAGNOSIS — R7309 Other abnormal glucose: Secondary | ICD-10-CM

## 2013-07-07 DIAGNOSIS — I2510 Atherosclerotic heart disease of native coronary artery without angina pectoris: Secondary | ICD-10-CM

## 2013-07-07 DIAGNOSIS — I10 Essential (primary) hypertension: Secondary | ICD-10-CM

## 2013-07-07 DIAGNOSIS — G4733 Obstructive sleep apnea (adult) (pediatric): Secondary | ICD-10-CM

## 2013-07-07 DIAGNOSIS — E669 Obesity, unspecified: Secondary | ICD-10-CM

## 2013-07-07 DIAGNOSIS — R7401 Elevation of levels of liver transaminase levels: Secondary | ICD-10-CM

## 2013-07-07 DIAGNOSIS — H6983 Other specified disorders of Eustachian tube, bilateral: Secondary | ICD-10-CM

## 2013-07-07 DIAGNOSIS — K760 Fatty (change of) liver, not elsewhere classified: Secondary | ICD-10-CM

## 2013-07-07 DIAGNOSIS — K7689 Other specified diseases of liver: Secondary | ICD-10-CM

## 2013-07-07 DIAGNOSIS — E785 Hyperlipidemia, unspecified: Secondary | ICD-10-CM

## 2013-07-07 HISTORY — DX: Other specified disorders of Eustachian tube, unspecified ear: H69.80

## 2013-07-07 HISTORY — DX: Unspecified eustachian tube disorder, unspecified ear: H69.90

## 2013-07-07 NOTE — Progress Notes (Signed)
Patient ID: [name], male   DOB: 24-Jan-1941, 72 y.o.   MRN: [id number] SUBJECTIVE: CC: Chief Complaint  Patient presents with  . Follow-up    4 month follow up  ck ears     HPI:  Patient is here for follow up of hyperlipidemia/cad/htn/fatty liver/metabolic  syndrome: denies Headache;denies Chest Pain;denies weakness;denies Shortness of Breath and orthopnea;denies Visual changes;denies palpitations;denies cough;denies pedal edema;denies symptoms of TIA or stroke;deniesClaudication symptoms. admits to Compliance with medications; denies Problems with medications.    Past Medical History  Diagnosis Date  . 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 Other and unspecified hyperlipidemia   . Unspecified essential hypertension   . Obesity, unspecified   . Obstructive sleep apnea (adult) (pediatric)   . Hx of colonic polyps    Past Surgical History  Procedure Laterality Date  . Coronary stent placement    . Colonoscopy    . Polypectomy     History   Social History  . Marital Status: Married    Spouse Name: N/A    Number of Children: 1  . Years of Education: N/A   Occupational History  . Marine scientist     Retired    Social History Main Topics  . Smoking status: Former Smoker    Types: Cigarettes    Quit date: 03/06/1971  . Smokeless tobacco: Never Used  . Alcohol Use: Yes     Comment: 1 drink daily   . Drug Use: No  . Sexual Activity: Not on file   Other Topics Concern  . Not on file   Social History Narrative   0 caffeine drinks    Family History  Problem Relation Age of Onset  . Colon cancer Neg Hx   . Heart disease Father     and Multiple family members on father Side   . Irritable bowel syndrome Maternal Grandmother    Current Outpatient Prescriptions on File Prior to Visit  Medication Sig Dispense Refill  . aspirin 81 MG tablet Take 81 mg by mouth daily.        Marland Kitchen atorvastatin (LIPITOR) 40 MG tablet TAKE 1  TABLET DAILY  90 tablet  3  . clopidogrel (PLAVIX) 75 MG tablet TAKE 1 TABLET DAILY  90 tablet  0  . Glucosamine HCl 1500 MG TABS Take 1 tablet by mouth 2 (two) times daily.       Marland Kitchen lisinopril (PRINIVIL,ZESTRIL) 40 MG tablet TAKE 1 TABLET DAILY  30 tablet  2  . Multiple Vitamins-Minerals (CENTRUM SILVER PO) Take 1 capsule by mouth daily.        . nitroGLYCERIN (NITROSTAT) 0.4 MG SL tablet Place 0.4 mg under the tongue every 5 (five) minutes as needed.         No current facility-administered medications on file prior to visit.   No Known Allergies Immunization History  Administered Date(s) Administered  . Influenza Split 04/29/2013  . Pneumococcal Polysaccharide-23 06/09/2010  . Tdap 01/12/2011   Prior to Admission medications   Medication Sig Start Date End Date Taking? Authorizing Provider  aspirin 81 MG tablet Take 81 mg by mouth daily.      Historical Provider, MD  atorvastatin (LIPITOR) 40 MG tablet TAKE 1 TABLET DAILY 06/24/13   Dolores Patty, MD  clopidogrel (PLAVIX) 75 MG tablet TAKE 1 TABLET DAILY 04/29/13   Dolores Patty, MD  Glucosamine HCl 1500 MG TABS Take 1 tablet by mouth 2 (two) times daily.  Historical Provider, MD  lisinopril (PRINIVIL,ZESTRIL) 40 MG tablet TAKE 1 TABLET DAILY 11/11/12   Laurey Morale, MD  lisinopril (PRINIVIL,ZESTRIL) 40 MG tablet TAKE 1 TABLET ONCE A DAY 07/02/13   Laurey Morale, MD  Multiple Vitamins-Minerals (CENTRUM SILVER PO) Take 1 capsule by mouth daily.      Historical Provider, MD  nitroGLYCERIN (NITROSTAT) 0.4 MG SL tablet Place 0.4 mg under the tongue every 5 (five) minutes as needed.      Historical Provider, MD     ROS: As above in the HPI. All other systems are stable or negative.  OBJECTIVE: APPEARANCE:  Patient in no acute distress.The patient appeared well nourished and normally developed. Acyanotic. Waist: 40.5 inches VITAL SIGNS:BP 132/60  Pulse 70  Temp(Src) 97.6 F (36.4 C) (Oral)  Ht 5\' 11"  (1.803 m)  Wt  226 lb (102.513 kg)  BMI 31.53 kg/m2 Obese WM in heavy clothing today  SKIN: warm and  Dry without overt rashes, tattoos and scars  HEAD and Neck: without JVD, Head and scalp: normal Eyes:No scleral icterus. Fundi normal, eye movements normal. Ears: Auricle normal, canal normal, Tympanic membranes normal, insufflation poor. Nose: normal Throat: normal Neck & thyroid: normal  CHEST & LUNGS: Chest wall: normal Lungs: Clear  CVS: Reveals the PMI to be normally located. Regular rhythm, First and Second Heart sounds are normal,  absence of murmurs, rubs or gallops. Peripheral vasculature: Radial pulses: normal Dorsal pedis pulses: normal Posterior pulses: normal  ABDOMEN:  Appearance: obese Benign, no organomegaly, no masses, no Abdominal Aortic enlargement. No Guarding , no rebound. No Bruits. Bowel sounds: normal  RECTAL: N/A GU: N/A  EXTREMETIES: nonedematous.  MUSCULOSKELETAL:  Spine: normal Joints: intact  NEUROLOGIC: oriented to time,place and person; nonfocal. Strength is normal Sensory is normal Reflexes are normal Cranial Nerves are normal.  Results for orders placed in visit on 06/01/13  HEPATIC FUNCTION PANEL      Result Value Range   Total Protein 6.0  6.0 - 8.5 g/dL   Albumin 3.7  3.5 - 4.8 g/dL   Total Bilirubin 0.6  0.0 - 1.2 mg/dL   Bilirubin, Direct 6.21  0.00 - 0.40 mg/dL   Alkaline Phosphatase 139 (*) 39 - 117 IU/L   AST 55 (*) 0 - 40 IU/L   ALT 41  0 - 44 IU/L     ASSESSMENT: HYPERLIPIDEMIA - Plan: CMP14+EGFR, NMR, lipoprofile, CANCELED: Prescription Abuse Monitoring, 17 Panel  CAD - Plan: CANCELED: Prescription Abuse Monitoring, 17 Panel  HYPERTENSION - Plan: CANCELED: Prescription Abuse Monitoring, 17 Panel  Abnormal transaminases - Plan: CANCELED: Prescription Abuse Monitoring, 17 Panel  Metabolic syndrome - Plan: CANCELED: Prescription Abuse Monitoring, 17 Panel  OBESITY - Plan: CANCELED: Prescription Abuse Monitoring, 17  Panel  SLEEP APNEA, OBSTRUCTIVE - Plan: CANCELED: Prescription Abuse Monitoring, 17 Panel  Fatty liver disease, nonalcoholic - Plan: HYQ65+HQIO, CANCELED: Prescription Abuse Monitoring, 17 Panel  ETD (eustachian tube dysfunction), bilateral  PLAN:      Dr Woodroe Mode Recommendations  For nutrition information, I recommend books:  1).Eat to Live by Dr Monico Hoar. 2).Prevent and Reverse Heart Disease by Dr Suzzette Righter. 3) Dr Katherina Right Book:  Program to Reverse Diabetes  Exercise recommendations are:  If unable to walk, then the patient can exercise in a chair 3 times a day. By flapping arms like a bird gently and raising legs outwards to the front.  If ambulatory, the patient can go for walks for 30 minutes 3 times a week. Then  increase the intensity and duration as tolerated.  Goal is to try to attain exercise frequency to 5 times a week.  If applicable: Best to perform resistance exercises (machines or weights) 2 days a week and cardio type exercises 3 days per week.  Recommend a nasal spray for the nose to treat the ET dysfunction but patient declines. He thought his ear were blocked with wax and it is not.  Orders Placed This Encounter  Procedures  . CMP14+EGFR  . NMR, lipoprofile   No orders of the defined types were placed in this encounter.   Medications Discontinued During This Encounter  Medication Reason  . lisinopril (PRINIVIL,ZESTRIL) 40 MG tablet Duplicate   Return in about 4 months (around 11/05/2013) for Recheck medical problems.  Basel Defalco P. Modesto Charon, M.D.

## 2013-07-07 NOTE — Patient Instructions (Signed)
      Dr Jamieka Royle's Recommendations  For nutrition information, I recommend books:  1).Eat to Live by Dr Joel Fuhrman. 2).Prevent and Reverse Heart Disease by Dr Caldwell Esselstyn. 3) Dr Neal Barnard's Book:  Program to Reverse Diabetes  Exercise recommendations are:  If unable to walk, then the patient can exercise in a chair 3 times a day. By flapping arms like a bird gently and raising legs outwards to the front.  If ambulatory, the patient can go for walks for 30 minutes 3 times a week. Then increase the intensity and duration as tolerated.  Goal is to try to attain exercise frequency to 5 times a week.  If applicable: Best to perform resistance exercises (machines or weights) 2 days a week and cardio type exercises 3 days per week.  

## 2013-07-09 LAB — CMP14+EGFR
ALT: 51 IU/L — ABNORMAL HIGH (ref 0–44)
AST: 59 IU/L — ABNORMAL HIGH (ref 0–40)
Albumin/Globulin Ratio: 2 (ref 1.1–2.5)
Albumin: 4 g/dL (ref 3.5–4.8)
Alkaline Phosphatase: 127 IU/L — ABNORMAL HIGH (ref 39–117)
BUN/Creatinine Ratio: 14 (ref 10–22)
BUN: 12 mg/dL (ref 8–27)
CO2: 26 mmol/L (ref 18–29)
Calcium: 9.2 mg/dL (ref 8.6–10.2)
Chloride: 101 mmol/L (ref 97–108)
Creatinine, Ser: 0.88 mg/dL (ref 0.76–1.27)
GFR calc Af Amer: 100 mL/min/{1.73_m2} (ref >59)
GFR calc non Af Amer: 86 mL/min/{1.73_m2} (ref >59)
Globulin, Total: 2 g/dL (ref 1.5–4.5)
Glucose: 112 mg/dL — ABNORMAL HIGH (ref 65–99)
Potassium: 4.8 mmol/L (ref 3.5–5.2)
Sodium: 142 mmol/L (ref 134–144)
Total Bilirubin: 0.4 mg/dL (ref 0.0–1.2)
Total Protein: 6 g/dL (ref 6.0–8.5)

## 2013-07-09 LAB — NMR, LIPOPROFILE
Cholesterol: 141 mg/dL (ref <200)
HDL Cholesterol by NMR: 45 mg/dL (ref >=40)
HDL Particle Number: 40.4 umol/L (ref >=30.5)
LDL Particle Number: 1377 nmol/L — ABNORMAL HIGH (ref <1000)
LDL Size: 20.7 nm (ref >20.5)
LDLC SERPL CALC-MCNC: 74 mg/dL (ref <100)
LP-IR Score: 79 — ABNORMAL HIGH (ref <=45)
Small LDL Particle Number: 726 nmol/L — ABNORMAL HIGH (ref <=527)
Triglycerides by NMR: 108 mg/dL (ref <150)

## 2013-07-09 NOTE — Progress Notes (Signed)
Quick Note:  Call Patient Labs that are abnormal: The liver enzymes are still elevated. The sugar is elevated. Will the HGBA1c.  The rest are at goal  Recommendations: No changes in recommendations.   ______

## 2013-07-09 NOTE — Addendum Note (Signed)
Addended by: Ileana Ladd on: 07/09/2013 03:45 PM   Modules accepted: Orders

## 2013-08-06 ENCOUNTER — Other Ambulatory Visit: Payer: Self-pay | Admitting: Cardiology

## 2013-08-10 ENCOUNTER — Other Ambulatory Visit: Payer: Self-pay

## 2013-08-10 ENCOUNTER — Telehealth: Payer: Self-pay | Admitting: Cardiology

## 2013-08-10 MED ORDER — LISINOPRIL 40 MG PO TABS: ORAL_TABLET | ORAL | Status: DC

## 2013-08-10 NOTE — Telephone Encounter (Signed)
error 

## 2013-08-26 ENCOUNTER — Other Ambulatory Visit: Payer: Self-pay | Admitting: Internal Medicine

## 2013-09-09 ENCOUNTER — Encounter: Payer: Self-pay | Admitting: Cardiology

## 2013-09-09 ENCOUNTER — Ambulatory Visit (INDEPENDENT_AMBULATORY_CARE_PROVIDER_SITE_OTHER): Payer: Medicare Other | Admitting: Cardiology

## 2013-09-09 VITALS — BP 126/68 | HR 76 | Ht 71.0 in | Wt 225.0 lb

## 2013-09-09 DIAGNOSIS — I2510 Atherosclerotic heart disease of native coronary artery without angina pectoris: Secondary | ICD-10-CM

## 2013-09-09 DIAGNOSIS — R011 Cardiac murmur, unspecified: Secondary | ICD-10-CM

## 2013-09-09 DIAGNOSIS — E785 Hyperlipidemia, unspecified: Secondary | ICD-10-CM

## 2013-09-09 NOTE — Patient Instructions (Signed)
Your physician wants you to follow-up in: 1 year with Dr. Kendall Flack will receive a reminder letter in the mail two months in advance. If you don't receive a letter, please call our office to schedule the follow-up appointment.  Your physician recommends that you continue on your current medications as directed. Please refer to the Current Medication list given to you today.

## 2013-09-10 NOTE — Progress Notes (Signed)
Patient ID: [name], male   DOB: 02-May-1941, 73 y.o.   MRN: [id number] PCP: Dr. Jacelyn Grip  73 yo with history of CAD and HTN presents for cardiology followup.  He had a DES to the LAD in 2/08. Repeat cath in 11/11 showed patent stent.  Echo in 4/13 showed EF 55-60%, no significant valvular abnormalities.  No recent chest pain.  He works out for 2.5 hours 3 days a week at [employer].  He walks on the treadmill.  No exertional dyspnea.  BP is controlled.  Weight is down 6 lbs since last appointment.   Labs (6/12): K 4.7, creatinine 0.7, LDL 74 Labs (12/14); K 4.8, creatinine 0.88, LDL-P 1377, LDL 74, AST 55, ALT 41  ECG: NSR, normal  PMH: 1. HTN 2. Hyperlipidemia 3. OSA: CPAP 4. CAD: Cypher DES in LAD in 2/08.  LHC in 11/11 with patent LAD stent, EF 55-60%. Echo (4/13) with EF 55-60%, no aortic stenosis.  5. Colonic polyps 6.  Mild transaminase elevation, ?NAFLD.    SH: Married, lives in [place].  2 children.  Retired.  Nonsmoker.    FH: CAD  Current Outpatient Prescriptions  Medication Sig Dispense Refill  . aspirin 81 MG tablet Take 81 mg by mouth daily.        Marland Kitchen atorvastatin (LIPITOR) 40 MG tablet TAKE 1 TABLET DAILY  90 tablet  3  . clopidogrel (PLAVIX) 75 MG tablet TAKE 1 TABLET DAILY  90 tablet  3  . Glucosamine HCl 1500 MG TABS Take 1 tablet by mouth 2 (two) times daily.       Marland Kitchen lisinopril (PRINIVIL,ZESTRIL) 40 MG tablet TAKE 1 TABLET DAILY  30 tablet  1  . Multiple Vitamins-Minerals (CENTRUM SILVER PO) Take 1 capsule by mouth daily.        . nitroGLYCERIN (NITROSTAT) 0.4 MG SL tablet Place 0.4 mg under the tongue every 5 (five) minutes as needed.         No current facility-administered medications for this visit.    BP 126/68  Pulse 76  Ht 5\' 11"  (1.803 m)  Wt 102.059 kg (225 lb)  BMI 31.39 kg/m2 General: NAD Neck: No JVD, no thyromegaly or thyroid nodule.  Lungs: Clear to auscultation bilaterally with normal respiratory effort. CV: Nondisplaced PMI.  Heart regular  S1/S2, no S3/S4, 2/6 early SEM RUSB.  1+ ankle edema bilaterally.  No carotid bruit.  Normal pedal pulses.  Abdomen: Soft, nontender, no hepatosplenomegaly, no distention.  Neurologic: Alert and oriented x 3.  Psych: Normal affect. Extremities: No clubbing or cyanosis.   Assessment/Plan: 1. CAD: Stable with no ischemic symptoms.  Continue ASA 81, Plavix (1st generation DES), atorvastatin, lisinopril. Continue exercise.  2. 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  3. Hyperlipidemia: Good LDL (goal < 70) but elevated LDL particle number.  I would continue current atorvastatin dose at this time given his elevated AST (probably NAFLD).    Loralie Champagne 09/10/2013

## 2013-10-09 ENCOUNTER — Other Ambulatory Visit: Payer: Self-pay | Admitting: Cardiology

## 2013-11-17 ENCOUNTER — Encounter: Payer: Self-pay | Admitting: Family Medicine

## 2013-11-17 ENCOUNTER — Ambulatory Visit (INDEPENDENT_AMBULATORY_CARE_PROVIDER_SITE_OTHER): Payer: Medicare Other | Admitting: Family Medicine

## 2013-11-17 VITALS — BP 124/62 | HR 69 | Temp 97.6°F | Ht 71.0 in | Wt 220.8 lb

## 2013-11-17 DIAGNOSIS — R7402 Elevation of levels of lactic acid dehydrogenase (LDH): Secondary | ICD-10-CM

## 2013-11-17 DIAGNOSIS — K7689 Other specified diseases of liver: Secondary | ICD-10-CM

## 2013-11-17 DIAGNOSIS — R748 Abnormal levels of other serum enzymes: Secondary | ICD-10-CM

## 2013-11-17 DIAGNOSIS — I2510 Atherosclerotic heart disease of native coronary artery without angina pectoris: Secondary | ICD-10-CM

## 2013-11-17 DIAGNOSIS — E785 Hyperlipidemia, unspecified: Secondary | ICD-10-CM

## 2013-11-17 DIAGNOSIS — R740 Nonspecific elevation of levels of transaminase and lactic acid dehydrogenase [LDH]: Secondary | ICD-10-CM

## 2013-11-17 DIAGNOSIS — E8881 Metabolic syndrome: Secondary | ICD-10-CM

## 2013-11-17 DIAGNOSIS — G4733 Obstructive sleep apnea (adult) (pediatric): Secondary | ICD-10-CM

## 2013-11-17 DIAGNOSIS — E669 Obesity, unspecified: Secondary | ICD-10-CM

## 2013-11-17 DIAGNOSIS — I10 Essential (primary) hypertension: Secondary | ICD-10-CM

## 2013-11-17 DIAGNOSIS — R7401 Elevation of levels of liver transaminase levels: Secondary | ICD-10-CM

## 2013-11-17 DIAGNOSIS — K760 Fatty (change of) liver, not elsewhere classified: Secondary | ICD-10-CM

## 2013-11-17 NOTE — Progress Notes (Signed)
Patient ID: [name], male   DOB: [birth year], 73 y.o.   MRN: [id number] SUBJECTIVE: CC: Chief Complaint  Patient presents with  . Follow-up    4 MONTH FOLLOW UP CHRONIC PROBLEMS     HPI:  Patient is here for follow up of hyperlipidemia/HTN/OSA/Metabolic syndrome/abnormal transaminases/CAD: denies Headache;denies Chest Pain;denies weakness;denies Shortness of Breath and orthopnea;denies Visual changes;denies palpitations;denies cough;denies pedal edema;denies symptoms of TIA or stroke;deniesClaudication symptoms. admits to Compliance with medications; denies Problems with medications.   Past Medical History  Diagnosis Date  . 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 Other and unspecified hyperlipidemia   . Unspecified essential hypertension   . Obesity, unspecified   . Obstructive sleep apnea (adult) (pediatric)   . Hx of colonic polyps    Past Surgical History  Procedure Laterality Date  . Coronary stent placement    . Colonoscopy    . Polypectomy     History   Social History  . Marital Status: Married    Spouse Name: N/A    Number of Children: 1  . Years of Education: N/A   Occupational History  . Nurse, learning disability     Retired    Social History Main Topics  . Smoking status: Former Smoker    Types: Cigarettes    Quit date: 03/06/1971  . Smokeless tobacco: Never Used  . Alcohol Use: Yes     Comment: 1 drink daily   . Drug Use: No  . Sexual Activity: Not on file   Other Topics Concern  . Not on file   Social History Narrative   0 caffeine drinks    Family History  Problem Relation Age of Onset  . Colon cancer Neg Hx   . Heart disease Father     and Multiple family members on father Side   . Irritable bowel syndrome Maternal Grandmother    Current Outpatient Prescriptions on File Prior to Visit  Medication Sig Dispense Refill  . aspirin 81 MG tablet Take 81 mg by mouth daily.        Marland Kitchen atorvastatin (LIPITOR) 40 MG  tablet TAKE 1 TABLET DAILY  90 tablet  3  . clopidogrel (PLAVIX) 75 MG tablet TAKE 1 TABLET DAILY  90 tablet  3  . Glucosamine HCl 1500 MG TABS Take 1 tablet by mouth 2 (two) times daily.       Marland Kitchen lisinopril (PRINIVIL,ZESTRIL) 40 MG tablet TAKE 1 TABLET DAILY  30 tablet  5  . Multiple Vitamins-Minerals (CENTRUM SILVER PO) Take 1 capsule by mouth daily.        . nitroGLYCERIN (NITROSTAT) 0.4 MG SL tablet Place 0.4 mg under the tongue every 5 (five) minutes as needed.         No current facility-administered medications on file prior to visit.   No Known Allergies Immunization History  Administered Date(s) Administered  . Influenza Split 04/29/2013  . Pneumococcal Polysaccharide-23 06/09/2010  . Tdap 01/12/2011   Prior to Admission medications   Medication Sig Start Date End Date Taking? Authorizing Provider  aspirin 81 MG tablet Take 81 mg by mouth daily.     Yes Historical Provider, MD  atorvastatin (LIPITOR) 40 MG tablet TAKE 1 TABLET DAILY 06/24/13  Yes Jolaine Artist, MD  clopidogrel (PLAVIX) 75 MG tablet TAKE 1 TABLET DAILY 08/26/13  Yes Jolaine Artist, MD  Glucosamine HCl 1500 MG TABS Take 1 tablet by mouth 2 (two) times daily.    Yes Historical  Provider, MD  lisinopril (PRINIVIL,ZESTRIL) 40 MG tablet TAKE 1 TABLET DAILY 10/09/13  Yes Larey Dresser, MD  Multiple Vitamins-Minerals (CENTRUM SILVER PO) Take 1 capsule by mouth daily.     Yes Historical Provider, MD  nitroGLYCERIN (NITROSTAT) 0.4 MG SL tablet Place 0.4 mg under the tongue every 5 (five) minutes as needed.     Yes Historical Provider, MD     ROS: As above in the HPI. All other systems are stable or negative.  OBJECTIVE: APPEARANCE:  Patient in no acute distress.The patient appeared well nourished and normally developed. Acyanotic. Waist: VITAL SIGNS:BP 124/62  Pulse 69  Temp(Src) 97.6 F (36.4 C) (Oral)  Ht _0  (1.803 m)  Wt 220 lb 12.8 oz (100.154 kg)  BMI 30.81 kg/m2 Obese WM  SKIN: warm and   Dry without overt rashes, tattoos and scars  HEAD and Neck: without JVD, Head and scalp: normal Eyes:No scleral icterus. Fundi normal, eye movements normal. Ears: Auricle normal, canal normal, Tympanic membranes normal, insufflation normal. Nose: normal Throat: normal Neck & thyroid: normal  CHEST & LUNGS: Chest wall: normal Lungs: Clear  CVS: Reveals the PMI to be normally located. Regular rhythm, First and Second Heart sounds are normal,  absence of murmurs, rubs or gallops. Peripheral vasculature: Radial pulses: normal Dorsal pedis pulses: normal Posterior pulses: normal  ABDOMEN:  Appearance: obese Benign, no organomegaly, no masses, no Abdominal Aortic enlargement. No Guarding , no rebound. No Bruits. Bowel sounds: normal  RECTAL: N/A GU: N/A  EXTREMETIES: nonedematous.  MUSCULOSKELETAL:  Spine: normal Joints: intact  NEUROLOGIC: oriented to time,place and person; nonfocal. Strength is normal Sensory is normal Reflexes are normal Cranial Nerves are normal. Results for orders placed in visit on 07/07/13  CMP14+EGFR      Result Value Ref Range   Glucose 112 (*) 65 - 99 mg/dL   BUN 12  8 - 27 mg/dL   Creatinine, Ser 0.88  0.76 - 1.27 mg/dL   GFR calc non Af Amer 86  >59 mL/min/1.73   GFR calc Af Amer 100  >59 mL/min/1.73   BUN/Creatinine Ratio 14  10 - 22   Sodium 142  134 - 144 mmol/L   Potassium 4.8  3.5 - 5.2 mmol/L   Chloride 101  97 - 108 mmol/L   CO2 26  18 - 29 mmol/L   Calcium 9.2  8.6 - 10.2 mg/dL   Total Protein 6.0  6.0 - 8.5 g/dL   Albumin 4.0  3.5 - 4.8 g/dL   Globulin, Total 2.0  1.5 - 4.5 g/dL   Albumin/Globulin Ratio 2.0  1.1 - 2.5   Total Bilirubin 0.4  0.0 - 1.2 mg/dL   Alkaline Phosphatase 127 (*) 39 - 117 IU/L   AST 59 (*) 0 - 40 IU/L   ALT 51 (*) 0 - 44 IU/L  NMR, LIPOPROFILE      Result Value Ref Range   LDL Particle Number [id number] (*) <1000 nmol/L   LDLC SERPL CALC-MCNC 74  <100 mg/dL   HDL Cholesterol by NMR 45  >=40 mg/dL    Triglycerides by NMR 108  <150 mg/dL   Cholesterol 141  <200 mg/dL   HDL Particle Number 40.4  >=30.5 umol/L   Small LDL Particle Number [id number] (*) <=527 nmol/L   LDL Size 20.7  >20.5 nm   LP-IR Score 79 (*) <=45    ASSESSMENT:  HYPERTENSION - Plan: CMP14+EGFR  HYPERLIPIDEMIA - Plan: CMP14+EGFR, Lipid panel  Fatty liver disease, nonalcoholic  CAD  SLEEP APNEA, OBSTRUCTIVE  OBESITY  Metabolic syndrome  Abnormal transaminases  PLAN: Keep active./exercise. DASH diet/low salt. Dietary changes emphasized. Weight reduction Use his CPAP Await labs.  Orders Placed This Encounter  Procedures  . CMP14+EGFR  . Lipid panel   No orders of the defined types were placed in this encounter.   There are no discontinued medications. Return in about 4 months (around 03/19/2014) for Recheck medical problems.  Wilberto Console P. Jacelyn Grip, M.D.

## 2013-11-18 LAB — LIPID PANEL
Chol/HDL Ratio: 2.7 ratio units (ref 0.0–5.0)
Cholesterol, Total: 141 mg/dL (ref 100–199)
HDL: 53 mg/dL (ref >39)
LDL Calculated: 71 mg/dL (ref 0–99)
Triglycerides: 87 mg/dL (ref 0–149)
VLDL Cholesterol Cal: 17 mg/dL (ref 5–40)

## 2013-11-18 LAB — CMP14+EGFR
ALT: 49 IU/L — ABNORMAL HIGH (ref 0–44)
AST: 69 IU/L — ABNORMAL HIGH (ref 0–40)
Albumin/Globulin Ratio: 2 (ref 1.1–2.5)
Albumin: 4.3 g/dL (ref 3.5–4.8)
Alkaline Phosphatase: 120 IU/L — ABNORMAL HIGH (ref 39–117)
BUN/Creatinine Ratio: 12 (ref 10–22)
BUN: 11 mg/dL (ref 8–27)
CO2: 25 mmol/L (ref 18–29)
Calcium: 9.4 mg/dL (ref 8.6–10.2)
Chloride: 101 mmol/L (ref 97–108)
Creatinine, Ser: 0.95 mg/dL (ref 0.76–1.27)
GFR calc Af Amer: 93 mL/min/{1.73_m2} (ref >59)
GFR calc non Af Amer: 80 mL/min/{1.73_m2} (ref >59)
Globulin, Total: 2.2 g/dL (ref 1.5–4.5)
Glucose: 106 mg/dL — ABNORMAL HIGH (ref 65–99)
Potassium: 5 mmol/L (ref 3.5–5.2)
Sodium: 143 mmol/L (ref 134–144)
Total Bilirubin: 0.7 mg/dL (ref 0.0–1.2)
Total Protein: 6.5 g/dL (ref 6.0–8.5)

## 2013-11-22 ENCOUNTER — Other Ambulatory Visit: Payer: Self-pay | Admitting: Family Medicine

## 2013-11-22 DIAGNOSIS — K760 Fatty (change of) liver, not elsewhere classified: Secondary | ICD-10-CM

## 2013-11-22 NOTE — Progress Notes (Signed)
Quick Note:  Call Patient Labs that are abnormal: Liver enzymes are still high  The rest are at goal: the lipids are looking good  Recommendations: Needs referral to liver specialist to evaluate and see if other treatments are needed. Fatty liver is now the number #1 cause of cirrhosis. We have to prevent this He needs to be one a plant based diet.   ______

## 2013-11-23 ENCOUNTER — Telehealth: Payer: Self-pay | Admitting: Family Medicine

## 2013-11-23 NOTE — Telephone Encounter (Signed)
Message copied by Waverly Ferrari on Mon Nov 23, 2013 10:35 AM ------      Message from: Vernie Shanks      Created: Sun Nov 22, 2013  3:26 PM       Call Patient      Labs that are abnormal:      Liver enzymes are still high            The rest are at goal: the lipids are looking good            Recommendations:      Needs referral to liver specialist to evaluate and see if other treatments are needed.      Fatty liver is now the number #1 cause of cirrhosis.      We have to prevent this      He needs to be one a plant based diet.             ------

## 2014-03-23 ENCOUNTER — Encounter (INDEPENDENT_AMBULATORY_CARE_PROVIDER_SITE_OTHER): Payer: Self-pay

## 2014-03-23 ENCOUNTER — Ambulatory Visit (INDEPENDENT_AMBULATORY_CARE_PROVIDER_SITE_OTHER): Payer: Medicare Other | Admitting: Family Medicine

## 2014-03-23 ENCOUNTER — Encounter: Payer: Self-pay | Admitting: Family Medicine

## 2014-03-23 VITALS — BP 121/67 | HR 74 | Temp 97.4°F | Ht 71.0 in | Wt 216.0 lb

## 2014-03-23 DIAGNOSIS — K7689 Other specified diseases of liver: Secondary | ICD-10-CM

## 2014-03-23 DIAGNOSIS — I10 Essential (primary) hypertension: Secondary | ICD-10-CM

## 2014-03-23 DIAGNOSIS — I2510 Atherosclerotic heart disease of native coronary artery without angina pectoris: Secondary | ICD-10-CM

## 2014-03-23 DIAGNOSIS — K760 Fatty (change of) liver, not elsewhere classified: Secondary | ICD-10-CM

## 2014-03-23 NOTE — Progress Notes (Signed)
   Subjective:    Patient ID: [name], male    DOB: 07-02-1941, 73 y.o.   MRN: [id number]  HPI 73 year old male who is here to followup on lipid problems. He was sent out to a hepatologist for her gastroenterologist for some mild elevation of his LFTs. The history I get from the patient is that he feels like the statins cause elevation but is only minimal and probably lipids her should be continued to be treated with statin. We did tell up about going to every other day use of statin to see if that would improve liver functions as well as maintain his current lipid numbers. Otherwise he has some occasional numbness or tingling in both his hands and feet. He is prediabetic and is exercising and trying to lose some weight in that regard.    Review of Systems     BP 121/67  Pulse 74  Temp(Src) 97.4 F (36.3 C) (Oral)  Ht 5\' 11"  (1.803 m)  Wt 216 lb (97.977 kg)  BMI 30.14 kg/m2   Physical Exam  Constitutional: He is oriented to person, place, and time. He appears well-developed and well-nourished.  HENT:  Head: Normocephalic.  Right Ear: External ear normal.  Left Ear: External ear normal.  Nose: Nose normal.  Mouth/Throat: Oropharynx is clear and moist.  Eyes: Conjunctivae and EOM are normal. Pupils are equal, round, and reactive to light.  Neck: Normal range of motion. Neck supple.  Cardiovascular: Normal rate, regular rhythm, normal heart sounds and intact distal pulses.   Pulmonary/Chest: Effort normal and breath sounds normal.  Abdominal: Soft. Bowel sounds are normal.  Musculoskeletal: Normal range of motion.  Neurological: He is alert and oriented to person, place, and time.  Skin: Skin is warm and dry.  Psychiatric: He has a normal mood and affect. His behavior is normal. Judgment and thought content normal.          Assessment & Plan:  1. HYPERTENSION BP controlled  2. CAD Rarely if ever uses NTG  3. Fatty liver disease, nonalcoholic See above  plan.  Wardell Honour MD

## 2014-06-07 ENCOUNTER — Other Ambulatory Visit: Payer: Self-pay | Admitting: Cardiology

## 2014-07-20 ENCOUNTER — Ambulatory Visit: Payer: Medicare Other | Admitting: Family Medicine

## 2014-07-21 ENCOUNTER — Ambulatory Visit (INDEPENDENT_AMBULATORY_CARE_PROVIDER_SITE_OTHER): Payer: Medicare Other | Admitting: Family Medicine

## 2014-07-21 ENCOUNTER — Encounter: Payer: Self-pay | Admitting: Family Medicine

## 2014-07-21 DIAGNOSIS — Z139 Encounter for screening, unspecified: Secondary | ICD-10-CM

## 2014-07-21 DIAGNOSIS — E785 Hyperlipidemia, unspecified: Secondary | ICD-10-CM

## 2014-07-21 NOTE — Progress Notes (Signed)
   Subjective:    Patient ID: [name], male    DOB: 03/12/1941, 73 y.o.   MRN: [id number]  HPI 73 year old gentleman is here to follow-up with lipids and liver. He is status post stenting and takes Plavix. Last visit we suggested going to every other day atorvastatin and will be interested today to see if his lipids have changed any or have his liver enzymes change which showed some slight elevation previously. He feels well has no symptoms or complaints today    Review of Systems  Constitutional: Negative.   HENT: Negative.   Eyes: Negative.   Respiratory: Negative.  Negative for shortness of breath.   Cardiovascular: Negative.  Negative for chest pain and leg swelling.  Gastrointestinal: Negative.   Genitourinary: Negative.   Musculoskeletal: Negative.   Skin: Negative.   Neurological: Negative.   Psychiatric/Behavioral: Negative.   All other systems reviewed and are negative.      Objective:   Physical Exam  Constitutional: He is oriented to person, place, and time. He appears well-developed and well-nourished.  HENT:  Head: Normocephalic.  Right Ear: External ear normal.  Left Ear: External ear normal.  Nose: Nose normal.  Mouth/Throat: Oropharynx is clear and moist.  Eyes: Conjunctivae and EOM are normal. Pupils are equal, round, and reactive to light.  Neck: Normal range of motion. Neck supple.  Cardiovascular: Normal rate, regular rhythm, normal heart sounds and intact distal pulses.   Pulmonary/Chest: Effort normal and breath sounds normal.  Abdominal: Soft. Bowel sounds are normal.  Musculoskeletal: Normal range of motion.  Neurological: He is alert and oriented to person, place, and time.  Skin: Skin is warm and dry.  Psychiatric: He has a normal mood and affect. His behavior is normal. Judgment and thought content normal.   There were no vitals taken for this visit.       Assessment & Plan:  1. Hyperlipemia  - Lipid panel - Hepatic function  panel  2. Screening  - PSA, total and free  Wardell Honour MD

## 2014-07-22 ENCOUNTER — Telehealth: Payer: Self-pay | Admitting: Family Medicine

## 2014-07-22 LAB — HEPATIC FUNCTION PANEL
ALBUMIN: 3.8 g/dL (ref 3.5–4.8)
ALK PHOS: 132 IU/L — AB (ref 39–117)
ALT: 57 IU/L — ABNORMAL HIGH (ref 0–44)
AST: 70 IU/L — ABNORMAL HIGH (ref 0–40)
BILIRUBIN TOTAL: 0.6 mg/dL (ref 0.0–1.2)
Bilirubin, Direct: 0.2 mg/dL (ref 0.00–0.40)
TOTAL PROTEIN: 6 g/dL (ref 6.0–8.5)

## 2014-07-22 LAB — LIPID PANEL
Chol/HDL Ratio: 3.1 ratio units (ref 0.0–5.0)
Cholesterol, Total: 149 mg/dL (ref 100–199)
HDL: 48 mg/dL (ref >39)
LDL Calculated: 87 mg/dL (ref 0–99)
Triglycerides: 70 mg/dL (ref 0–149)
VLDL CHOLESTEROL CAL: 14 mg/dL (ref 5–40)

## 2014-07-22 LAB — PSA, TOTAL AND FREE
PSA FREE PCT: 16.8 %
PSA, Free: 0.42 ng/mL
PSA: 2.5 ng/mL (ref 0.0–4.0)

## 2014-07-22 NOTE — Telephone Encounter (Signed)
-----   Message from Wardell Honour, MD sent at 07/22/2014  7:36 AM EST ----- PSA is still within the normal range. Lipids are normal and chemistries show some mild elevation of liver function tests but are basically unchanged from last test 8 months ago

## 2014-07-22 NOTE — Telephone Encounter (Signed)
Letter sent with results

## 2014-09-09 ENCOUNTER — Encounter: Payer: Self-pay | Admitting: *Deleted

## 2014-09-09 ENCOUNTER — Encounter: Payer: Self-pay | Admitting: Cardiology

## 2014-09-09 ENCOUNTER — Ambulatory Visit (INDEPENDENT_AMBULATORY_CARE_PROVIDER_SITE_OTHER): Payer: 59 | Admitting: Cardiology

## 2014-09-09 ENCOUNTER — Ambulatory Visit: Payer: Medicare Other | Admitting: Cardiology

## 2014-09-09 VITALS — BP 118/56 | HR 80 | Ht 71.0 in | Wt 221.0 lb

## 2014-09-09 DIAGNOSIS — R011 Cardiac murmur, unspecified: Secondary | ICD-10-CM

## 2014-09-09 DIAGNOSIS — I2510 Atherosclerotic heart disease of native coronary artery without angina pectoris: Secondary | ICD-10-CM

## 2014-09-09 DIAGNOSIS — E785 Hyperlipidemia, unspecified: Secondary | ICD-10-CM

## 2014-09-09 LAB — BASIC METABOLIC PANEL
BUN: 20 mg/dL (ref 6–23)
CALCIUM: 9.5 mg/dL (ref 8.4–10.5)
CO2: 28 mEq/L (ref 19–32)
Chloride: 104 mEq/L (ref 96–112)
Creatinine, Ser: 1.32 mg/dL (ref 0.40–1.50)
GFR: 56.55 mL/min — AB (ref >60.00)
GLUCOSE: 106 mg/dL — AB (ref 70–99)
POTASSIUM: 4.4 meq/L (ref 3.5–5.1)
SODIUM: 139 meq/L (ref 135–145)

## 2014-09-09 LAB — LIPID PANEL
CHOL/HDL RATIO: 3
Cholesterol: 158 mg/dL (ref 0–200)
HDL: 49.9 mg/dL (ref >39.00)
LDL CALC: 84 mg/dL (ref 0–99)
NonHDL: 108.1
Triglycerides: 123 mg/dL (ref 0.0–149.0)
VLDL: 24.6 mg/dL (ref 0.0–40.0)

## 2014-09-09 LAB — CBC WITH DIFFERENTIAL/PLATELET
BASOS ABS: 0 10*3/uL (ref 0.0–0.1)
Basophils Relative: 0.4 % (ref 0.0–3.0)
EOS ABS: 0.2 10*3/uL (ref 0.0–0.7)
Eosinophils Relative: 1.9 % (ref 0.0–5.0)
HEMATOCRIT: 41.5 % (ref 39.0–52.0)
HEMOGLOBIN: 14.3 g/dL (ref 13.0–17.0)
LYMPHS ABS: 2.3 10*3/uL (ref 0.7–4.0)
Lymphocytes Relative: 25.5 % (ref 12.0–46.0)
MCHC: 34.5 g/dL (ref 30.0–36.0)
MCV: 101.6 fl — AB (ref 78.0–100.0)
Monocytes Absolute: 0.8 10*3/uL (ref 0.1–1.0)
Monocytes Relative: 8.7 % (ref 3.0–12.0)
NEUTROS ABS: 5.8 10*3/uL (ref 1.4–7.7)
Neutrophils Relative %: 63.5 % (ref 43.0–77.0)
Platelets: 150 10*3/uL (ref 150.0–400.0)
RBC: 4.09 Mil/uL — ABNORMAL LOW (ref 4.22–5.81)
RDW: 13 % (ref 11.5–15.5)
WBC: 9.2 10*3/uL (ref 4.0–10.5)

## 2014-09-09 NOTE — Patient Instructions (Addendum)
Your physician has requested that you have an echocardiogram. Echocardiography is a painless test that uses sound waves to create images of your heart. It provides your doctor with information about the size and shape of your heart and how well your heart's chambers and valves are working. This procedure takes approximately one hour. There are no restrictions for this procedure.  Your physician recommends that you have  lab work today--BMET/CBCd/Lipid profile.   Your physician wants you to follow-up in: 1 year with Dr Aundra Dubin. (February 2017).  You will receive a reminder letter in the mail two months in advance. If you don't receive a letter, please call our office to schedule the follow-up appointment.

## 2014-09-10 ENCOUNTER — Other Ambulatory Visit: Payer: Self-pay | Admitting: Cardiology

## 2014-09-10 NOTE — Progress Notes (Signed)
Patient ID: [name], male   DOB: 05/21/1941, 74 y.o.   MRN: [id number] PCP: Dr. Alain Honey  74 yo with history of CAD and HTN presents for cardiology followup.  He had a DES to the LAD in 2/08. Repeat cath in 11/11 showed patent stent.  Echo in 4/13 showed EF 55-60%, no significant valvular abnormalities.  No recent chest pain.  He works out 3 days a week at [employer].  He walks on the treadmill for 6-7 miles/week.  No exertional dyspnea.  BP is controlled.  Weight is down 4 lbs since last appointment. He has been noted to have a mild transaminase increase and atorvastatin was decreased to every other day in 12/15.   Labs (6/12): K 4.7, creatinine 0.7, LDL 74 Labs (12/14); K 4.8, creatinine 0.88, LDL-P 1377, LDL 74, AST 55, ALT 41 Labs (4/15): K 5, creatinine 0.95 Labs (12/15): LDL 87, HDL 48, AST 70, ALT 57  ECG: NSR, normal  PMH: 1. HTN 2. Hyperlipidemia 3. OSA: CPAP 4. CAD: Cypher DES in LAD in 2/08.  LHC in 11/11 with patent LAD stent, EF 55-60%. Echo (4/13) with EF 55-60%, no aortic stenosis.  5. Colonic polyps 6.  Mild transaminase elevation, ?NAFLD.  Viral hepatitis workup in 9/14 was negative, ANA negative in 9/14.   SH: Married, lives in [place].  2 children.  Retired.  Nonsmoker.    FH: CAD  ROS: All systems reviewed and negative except as per HPI.   Current Outpatient Prescriptions  Medication Sig Dispense Refill  . aspirin 81 MG tablet Take 81 mg by mouth daily.      Marland Kitchen atorvastatin (LIPITOR) 40 MG tablet TAKE 1 TABLET DAILY 90 tablet 3  . clopidogrel (PLAVIX) 75 MG tablet TAKE 1 TABLET DAILY 90 tablet 3  . Glucosamine HCl 1500 MG TABS Take 1 tablet by mouth 2 (two) times daily.     Marland Kitchen lisinopril (PRINIVIL,ZESTRIL) 40 MG tablet TAKE 1 TABLET DAILY 30 tablet 6  . Multiple Vitamins-Minerals (CENTRUM SILVER PO) Take 1 capsule by mouth daily.      . nitroGLYCERIN (NITROSTAT) 0.4 MG SL tablet Place 0.4 mg under the tongue every 5 (five) minutes as needed.       No  current facility-administered medications for this visit.    BP 118/56 mmHg  Pulse 80  Ht 5\' 11"  (1.803 m)  Wt 221 lb (100.245 kg)  BMI 30.84 kg/m2 General: NAD Neck: No JVD, no thyromegaly or thyroid nodule.  Lungs: Clear to auscultation bilaterally with normal respiratory effort. CV: Nondisplaced PMI.  Heart regular S1/S2, no S3/S4, 2/6 early SEM RUSB.  No edema bilaterally.  No carotid bruit.  Normal pedal pulses.  Abdomen: Soft, nontender, no hepatosplenomegaly, no distention.  Neurologic: Alert and oriented x 3.  Psych: Normal affect. Extremities: No clubbing or cyanosis.   Assessment/Plan: 1. CAD: Stable with no ischemic symptoms.  Continue ASA 81, Plavix (1st generation DES), atorvastatin, lisinopril. Continue exercise.  2. Murmur: He had an echo about 3 years ago with aortic sclerosis.  Murmur is prominent, will get repeat echo.   3. Hyperlipidemia: Elevated transaminases likely due to NAFLD (negative viral hepatitis workup and ANA in 2014).  Atorvastatin decreased to qod in 12/15.  Would repeat lipids to see if LDL is still controlled.  If not, probably safe to increase atorvastatin back to daily.    Loralie Champagne 09/10/2014

## 2014-09-16 ENCOUNTER — Ambulatory Visit (HOSPITAL_COMMUNITY): Payer: Medicare Other | Attending: Cardiology

## 2014-09-16 DIAGNOSIS — E669 Obesity, unspecified: Secondary | ICD-10-CM | POA: Diagnosis not present

## 2014-09-16 DIAGNOSIS — E785 Hyperlipidemia, unspecified: Secondary | ICD-10-CM | POA: Diagnosis not present

## 2014-09-16 DIAGNOSIS — R011 Cardiac murmur, unspecified: Secondary | ICD-10-CM | POA: Diagnosis not present

## 2014-09-16 DIAGNOSIS — Z87891 Personal history of nicotine dependence: Secondary | ICD-10-CM | POA: Diagnosis not present

## 2014-09-16 DIAGNOSIS — I10 Essential (primary) hypertension: Secondary | ICD-10-CM | POA: Insufficient documentation

## 2014-09-16 NOTE — Progress Notes (Signed)
2D Echo completed. 09/16/2014 

## 2014-09-17 ENCOUNTER — Telehealth: Payer: Self-pay | Admitting: Cardiology

## 2014-09-17 NOTE — Telephone Encounter (Signed)
New Message    Patient is returning nurses phone call please give him a call back.   Thanks

## 2014-09-17 NOTE — Telephone Encounter (Signed)
Spoke with patient about echo results 

## 2014-10-20 ENCOUNTER — Encounter: Payer: Self-pay | Admitting: *Deleted

## 2014-11-09 ENCOUNTER — Other Ambulatory Visit: Payer: Self-pay | Admitting: Adult Health

## 2014-11-15 ENCOUNTER — Other Ambulatory Visit (HOSPITAL_COMMUNITY): Payer: Self-pay | Admitting: Internal Medicine

## 2014-12-01 ENCOUNTER — Other Ambulatory Visit (HOSPITAL_COMMUNITY): Payer: Self-pay | Admitting: Internal Medicine

## 2014-12-07 ENCOUNTER — Encounter: Payer: Self-pay | Admitting: Family Medicine

## 2014-12-07 ENCOUNTER — Ambulatory Visit (INDEPENDENT_AMBULATORY_CARE_PROVIDER_SITE_OTHER): Payer: Medicare Other | Admitting: Family Medicine

## 2014-12-07 VITALS — BP 125/62 | HR 53 | Temp 97.2°F | Ht 71.0 in | Wt 214.2 lb

## 2014-12-07 DIAGNOSIS — E785 Hyperlipidemia, unspecified: Secondary | ICD-10-CM

## 2014-12-07 DIAGNOSIS — I10 Essential (primary) hypertension: Secondary | ICD-10-CM | POA: Diagnosis not present

## 2014-12-07 DIAGNOSIS — G4733 Obstructive sleep apnea (adult) (pediatric): Secondary | ICD-10-CM | POA: Diagnosis not present

## 2014-12-07 NOTE — Progress Notes (Signed)
Subjective:    Patient ID: [name], male    DOB: [birth year], 74 y.o.   MRN: [id number]  HPI 75 year old gentleman here to follow-up hypertension and hyperlipidemia. At his last visit he was put on every other day Lipitor and his lipids do not show any significant change. This was also done in hopes that his liver enzymes would normalize but they are still mildly elevated and he is thought to have nonalcoholic fatty liver disease. He continues to walk on the treadmill 3 days a week. He has seen a cardiologist since his last visit here and all reports were good there.  He does complain of some transient leg weakness in on some mornings. This last less than an hour. I asked him about checking blood sugar and also relation to if he had had statin the day before. He will investigate these possibilities.  Patient Active Problem List   Diagnosis Date Noted  . ETD (eustachian tube dysfunction) 07/07/2013  . Fatty liver disease, nonalcoholic 96/29/5284  . Internal hemorrhoids 03/18/2013  . Abnormal transaminases 11/25/2012  . Metabolic syndrome 13/24/4010  . Acute hemorrhoid 11/11/2012  . Murmur 10/24/2011  . EDEMA 03/07/2009  . HEMATOMA 01/05/2009  . UNSPECIFIED CHRONIC ISCHEMIC HEART DISEASE 12/22/2008  . HYPERLIPIDEMIA 12/21/2008  . OBESITY 12/21/2008  . SLEEP APNEA, OBSTRUCTIVE 12/21/2008  . HYPERTENSION 12/21/2008  . Coronary atherosclerosis 12/21/2008   Outpatient Encounter Prescriptions as of 12/07/2014  Medication Sig  . aspirin 81 MG tablet Take 81 mg by mouth daily.    Marland Kitchen atorvastatin (LIPITOR) 40 MG tablet TAKE 1 TABLET DAILY  . clopidogrel (PLAVIX) 75 MG tablet TAKE 1 TABLET DAILY  . lisinopril (PRINIVIL,ZESTRIL) 40 MG tablet TAKE 1 TABLET DAILY  . Multiple Vitamins-Minerals (CENTRUM SILVER PO) Take 1 capsule by mouth daily.    . nitroGLYCERIN (NITROSTAT) 0.4 MG SL tablet DISSOLVE 1 TABLET UNDER TONGUE AS NEEDED FOR CHEST PAIN  . Glucosamine HCl 1500 MG TABS Take 1 tablet  by mouth 2 (two) times daily.    No facility-administered encounter medications on file as of 12/07/2014.      Review of Systems  HENT: Negative.   Eyes: Negative.   Respiratory: Negative.  Negative for shortness of breath.   Cardiovascular: Negative.  Negative for chest pain and leg swelling.  Gastrointestinal: Negative.   Genitourinary: Negative.   Musculoskeletal: Negative.   Skin: Negative.   Neurological: Positive for weakness.  Psychiatric/Behavioral: Negative.   All other systems reviewed and are negative.      Objective:   Physical Exam  Constitutional: He is oriented to person, place, and time. He appears well-developed and well-nourished.  HENT:  Head: Normocephalic.  Right Ear: External ear normal.  Left Ear: External ear normal.  Nose: Nose normal.  Mouth/Throat: Oropharynx is clear and moist.  Eyes: Conjunctivae and EOM are normal. Pupils are equal, round, and reactive to light.  Neck: Normal range of motion. Neck supple.  Cardiovascular: Normal rate, regular rhythm, normal heart sounds and intact distal pulses.   Pulmonary/Chest: Effort normal and breath sounds normal.  Abdominal: Soft. Bowel sounds are normal.  Musculoskeletal: Normal range of motion.  Neurological: He is alert and oriented to person, place, and time.  Skin: Skin is warm and dry.  Psychiatric: He has a normal mood and affect. His behavior is normal. Judgment and thought content normal.          Assessment & Plan:  1. Essential hypertension Blood pressures are well controlled with lisinopril can.  Continue same dose  2. Hyperlipidemia Lipids are well managed on every other day atorvastatin. I would continue same in view of the fact of he has mild transaminitis. The half-life of a atorvastatin is 4 days and I think every other day is fine for lipid lowering effect  Wardell Honour MD

## 2014-12-09 ENCOUNTER — Ambulatory Visit: Payer: Medicare Other | Admitting: Family Medicine

## 2014-12-21 ENCOUNTER — Ambulatory Visit (INDEPENDENT_AMBULATORY_CARE_PROVIDER_SITE_OTHER): Payer: Medicare Other | Admitting: *Deleted

## 2014-12-21 ENCOUNTER — Encounter: Payer: Self-pay | Admitting: *Deleted

## 2014-12-21 VITALS — BP 120/63 | HR 56 | Resp 20 | Ht 71.0 in | Wt 210.0 lb

## 2014-12-21 DIAGNOSIS — Z0000 Encounter for general adult medical examination without abnormal findings: Secondary | ICD-10-CM | POA: Diagnosis not present

## 2014-12-21 DIAGNOSIS — Z1212 Encounter for screening for malignant neoplasm of rectum: Secondary | ICD-10-CM

## 2014-12-21 NOTE — Patient Instructions (Signed)
Advance Directive Advance directives are the legal documents that allow you to make choices about your health care and medical treatment if you cannot speak for yourself. Advance directives are a way for you to communicate your wishes to family, friends, and health care providers. The specified people can then convey your decisions about end-of-life care to avoid confusion if you should become unable to communicate. Ideally, the process of discussing and writing advance directives should happen over time rather than making decisions all at once. Advance directives can be modified as your situation changes, and you can change your mind at any time, even after you have signed the advance directives. Each state has its own laws regarding advance directives. You may want to check with your health care provider, attorney, or state representative about the law in your state. Below are some examples of advance directives. LIVING WILL A living will is a set of instructions documenting your wishes about medical care when you cannot care for yourself. It is used if you become:  Terminally ill.  Incapacitated.  Unable to communicate.  Unable to make decisions. Items to consider in your living will include:  The use or non-use of life-sustaining equipment, such as dialysis machines and breathing machines (ventilators).  A do not resuscitate (DNR) order, which is the instruction not to use cardiopulmonary resuscitation (CPR) if breathing or heartbeat stops.  Tube feeding.  Withholding of food and fluids.  Comfort (palliative) care when the goal becomes comfort rather than a cure.  Organ and tissue donation. A living will does not give instructions about distribution of your money and property if you should pass away. It is advisable to seek the expert advice of a lawyer in drawing up a will regarding your possessions. Decisions about taxes, beneficiaries, and asset distribution will be legally binding.  This process can relieve your family and friends of any burdens surrounding disputes or questions that may come up about the allocation of your assets. DO NOT RESUSCITATE (DNR) A do not resuscitate (DNR) order is a request to not have CPR in the event that your heart stops beating or you stop breathing. Unless given other instructions, a health care provider will try to help any patient whose heart has stopped or who has stopped breathing.  HEALTH CARE PROXY AND DURABLE POWER OF ATTORNEY FOR HEALTH CARE A health care proxy is a person (agent) appointed to make medical decisions for you if you cannot. Generally, people choose someone they know well and trust to represent their preferences when they can no longer do so. You should be sure to ask this person for agreement to act as your agent. An agent may have to exercise judgment in the event of a medical decision for which your wishes are not known. The durable power of attorney for health care is the legal document that names your health care proxy. Once written, it should be:  Signed.  Notarized.  Dated.  Copied.  Witnessed.  Incorporated into your medical record. You may also want to appoint someone to manage your financial affairs if you cannot. This is called a durable power of attorney for finances. It is a separate legal document from the durable power of attorney for health care. You may choose the same person or someone different from your health care proxy to act as your agent in financial matters. Document Released: 10/23/2007 Document Revised: 07/21/2013 Document Reviewed: 12/03/2012 Exit[employer] Patient Information 2015 [employer]. This information is not intended to replace advice   given to you by your health care provider. Make sure you discuss any questions you have with your health care provider. DASH Eating Plan DASH stands for "Dietary Approaches to Stop Hypertension." The DASH eating plan is a healthy eating plan that has  been shown to reduce high blood pressure (hypertension). Additional health benefits may include reducing the risk of type 2 diabetes mellitus, heart disease, and stroke. The DASH eating plan may also help with weight loss. WHAT DO I NEED TO KNOW ABOUT THE DASH EATING PLAN? For the DASH eating plan, you will follow these general guidelines:  Choose foods with a percent daily value for sodium of less than 5% (as listed on the food label).  Use salt-free seasonings or herbs instead of table salt or sea salt.  Check with your health care provider or pharmacist before using salt substitutes.  Eat lower-sodium products, often labeled as "lower sodium" or "no salt added."  Eat fresh foods.  Eat more vegetables, fruits, and low-fat dairy products.  Choose whole grains. Look for the word "whole" as the first word in the ingredient list.  Choose fish and skinless chicken or turkey more often than red meat. Limit fish, poultry, and meat to 6 oz (170 g) each day.  Limit sweets, desserts, sugars, and sugary drinks.  Choose heart-healthy fats.  Limit cheese to 1 oz (28 g) per day.  Eat more home-cooked food and less restaurant, buffet, and fast food.  Limit fried foods.  Cook foods using methods other than frying.  Limit canned vegetables. If you do use them, rinse them well to decrease the sodium.  When eating at a restaurant, ask that your food be prepared with less salt, or no salt if possible. WHAT FOODS CAN I EAT? Seek help from a dietitian for individual calorie needs. Grains Whole grain or whole wheat bread. Brown rice. Whole grain or whole wheat pasta. Quinoa, bulgur, and whole grain cereals. Low-sodium cereals. Corn or whole wheat flour tortillas. Whole grain cornbread. Whole grain crackers. Low-sodium crackers. Vegetables Fresh or frozen vegetables (raw, steamed, roasted, or grilled). Low-sodium or reduced-sodium tomato and vegetable juices. Low-sodium or reduced-sodium tomato  sauce and paste. Low-sodium or reduced-sodium canned vegetables.  Fruits All fresh, canned (in natural juice), or frozen fruits. Meat and Other Protein Products Ground beef (85% or leaner), grass-fed beef, or beef trimmed of fat. Skinless chicken or turkey. Ground chicken or turkey. Pork trimmed of fat. All fish and seafood. Eggs. Dried beans, peas, or lentils. Unsalted nuts and seeds. Unsalted canned beans. Dairy Low-fat dairy products, such as skim or 1% milk, 2% or reduced-fat cheeses, low-fat ricotta or cottage cheese, or plain low-fat yogurt. Low-sodium or reduced-sodium cheeses. Fats and Oils Tub margarines without trans fats. Light or reduced-fat mayonnaise and salad dressings (reduced sodium). Avocado. Safflower, olive, or canola oils. Natural peanut or almond butter. Other Unsalted popcorn and pretzels. The items listed above may not be a complete list of recommended foods or beverages. Contact your dietitian for more options. WHAT FOODS ARE NOT RECOMMENDED? Grains White bread. White pasta. White rice. Refined cornbread. Bagels and croissants. Crackers that contain trans fat. Vegetables Creamed or fried vegetables. Vegetables in a cheese sauce. Regular canned vegetables. Regular canned tomato sauce and paste. Regular tomato and vegetable juices. Fruits Dried fruits. Canned fruit in light or heavy syrup. Fruit juice. Meat and Other Protein Products Fatty cuts of meat. Ribs, chicken wings, bacon, sausage, bologna, salami, chitterlings, fatback, hot dogs, bratwurst, and packaged luncheon meats. Salted   nuts and seeds. Canned beans with salt. Dairy Whole or 2% milk, cream, half-and-half, and cream cheese. Whole-fat or sweetened yogurt. Full-fat cheeses or blue cheese. Nondairy creamers and whipped toppings. Processed cheese, cheese spreads, or cheese curds. Condiments Onion and garlic salt, seasoned salt, table salt, and sea salt. Canned and packaged gravies. Worcestershire sauce. Tartar  sauce. Barbecue sauce. Teriyaki sauce. Soy sauce, including reduced sodium. Steak sauce. Fish sauce. Oyster sauce. Cocktail sauce. Horseradish. Ketchup and mustard. Meat flavorings and tenderizers. Bouillon cubes. Hot sauce. Tabasco sauce. Marinades. Taco seasonings. Relishes. Fats and Oils Butter, stick margarine, lard, shortening, ghee, and bacon fat. Coconut, palm kernel, or palm oils. Regular salad dressings. Other Pickles and olives. Salted popcorn and pretzels. The items listed above may not be a complete list of foods and beverages to avoid. Contact your dietitian for more information. WHERE CAN I FIND MORE INFORMATION? National [employer], [employer], [employer]: www.nhlbi.nih.gov/health/health-topics/topics/dash/ Document Released: 07/05/2011 Document Revised: 11/30/2013 Document Reviewed: 05/20/2013 Exit[employer] Patient Information 2015 [employer]. This information is not intended to replace advice given to you by your health care provider. Make sure you discuss any questions you have with your health care provider. Preventive Care for Adults A healthy lifestyle and preventive care can promote health and wellness. Preventive health guidelines for men include the following key practices:  A routine yearly physical is a good way to check with your health care provider about your health and preventative screening. It is a chance to share any concerns and updates on your health and to receive a thorough exam.  Visit your dentist for a routine exam and preventative care every 6 months. Brush your teeth twice a day and floss once a day. Good oral hygiene prevents tooth decay and gum disease.  The frequency of eye exams is based on your age, health, family medical history, use of contact lenses, and other factors. Follow your health care provider's recommendations for frequency of eye exams.  Eat a healthy diet. Foods such as vegetables, fruits, whole grains, low-fat dairy products, and lean  protein foods contain the nutrients you need without too many calories. Decrease your intake of foods high in solid fats, added sugars, and salt. Eat the right amount of calories for you.Get information about a proper diet from your health care provider, if necessary.  Regular physical exercise is one of the most important things you can do for your health. Most adults should get at least 150 minutes of moderate-intensity exercise (any activity that increases your heart rate and causes you to sweat) each week. In addition, most adults need muscle-strengthening exercises on 2 or more days a week.  Maintain a healthy weight. The body mass index (BMI) is a screening tool to identify possible weight problems. It provides an estimate of body fat based on height and weight. Your health care provider can find your BMI and can help you achieve or maintain a healthy weight.For adults 20 years and older:  A BMI below 18.5 is considered underweight.  A BMI of 18.5 to 24.9 is normal.  A BMI of 25 to 29.9 is considered overweight.  A BMI of 30 and above is considered obese.  Maintain normal blood lipids and cholesterol levels by exercising and minimizing your intake of saturated fat. Eat a balanced diet with plenty of fruit and vegetables. Blood tests for lipids and cholesterol should begin at age 20 and be repeated every 5 years. If your lipid or cholesterol levels are high, you are over 50,   or you are at high risk for heart disease, you may need your cholesterol levels checked more frequently.Ongoing high lipid and cholesterol levels should be treated with medicines if diet and exercise are not working.  If you smoke, find out from your health care provider how to quit. If you do not use tobacco, do not start.  Lung cancer screening is recommended for adults aged 55-80 years who are at high risk for developing lung cancer because of a history of smoking. A yearly low-dose CT scan of the lungs is  recommended for people who have at least a 30-pack-year history of smoking and are a current smoker or have quit within the past 15 years. A pack year of smoking is smoking an average of 1 pack of cigarettes a day for 1 year (for example: 1 pack a day for 30 years or 2 packs a day for 15 years). Yearly screening should continue until the smoker has stopped smoking for at least 15 years. Yearly screening should be stopped for people who develop a health problem that would prevent them from having lung cancer treatment.  If you choose to drink alcohol, do not have more than 2 drinks per day. One drink is considered to be 12 ounces (355 mL) of beer, 5 ounces (148 mL) of wine, or 1.5 ounces (44 mL) of liquor.  Avoid use of street drugs. Do not share needles with anyone. Ask for help if you need support or instructions about stopping the use of drugs.  High blood pressure causes heart disease and increases the risk of stroke. Your blood pressure should be checked at least every 1-2 years. Ongoing high blood pressure should be treated with medicines, if weight loss and exercise are not effective.  If you are 45-79 years old, ask your health care provider if you should take aspirin to prevent heart disease.  Diabetes screening involves taking a blood sample to check your fasting blood sugar level. This should be done once every 3 years, after age 45, if you are within normal weight and without risk factors for diabetes. Testing should be considered at a younger age or be carried out more frequently if you are overweight and have at least 1 risk factor for diabetes.  Colorectal cancer can be detected and often prevented. Most routine colorectal cancer screening begins at the age of 50 and continues through age 75. However, your health care provider may recommend screening at an earlier age if you have risk factors for colon cancer. On a yearly basis, your health care provider may provide home test kits to check  for hidden blood in the stool. Use of a small camera at the end of a tube to directly examine the colon (sigmoidoscopy or colonoscopy) can detect the earliest forms of colorectal cancer. Talk to your health care provider about this at age 50, when routine screening begins. Direct exam of the colon should be repeated every 5-10 years through age 75, unless early forms of precancerous polyps or small growths are found.  People who are at an increased risk for hepatitis B should be screened for this virus. You are considered at high risk for hepatitis B if:  You were born in a country where hepatitis B occurs often. Talk with your health care provider about which countries are considered high risk.  Your parents were born in a high-risk country and you have not received a shot to protect against hepatitis B (hepatitis B vaccine).  You have HIV   or AIDS.  You use needles to inject street drugs.  You live with, or have sex with, someone who has hepatitis B.  You are a man who has sex with other men (MSM).  You get hemodialysis treatment.  You take certain medicines for conditions such as cancer, organ transplantation, and autoimmune conditions.  Hepatitis C blood testing is recommended for all people born from 1945 through 1965 and any individual with known risks for hepatitis C.  Practice safe sex. Use condoms and avoid high-risk sexual practices to reduce the spread of sexually transmitted infections (STIs). STIs include gonorrhea, chlamydia, syphilis, trichomonas, herpes, HPV, and human immunodeficiency virus (HIV). Herpes, HIV, and HPV are viral illnesses that have no cure. They can result in disability, cancer, and death.  If you are at risk of being infected with HIV, it is recommended that you take a prescription medicine daily to prevent HIV infection. This is called preexposure prophylaxis (PrEP). You are considered at risk if:  You are a man who has sex with other men (MSM) and have  other risk factors.  You are a heterosexual man, are sexually active, and are at increased risk for HIV infection.  You take drugs by injection.  You are sexually active with a partner who has HIV.  Talk with your health care provider about whether you are at high risk of being infected with HIV. If you choose to begin PrEP, you should first be tested for HIV. You should then be tested every 3 months for as long as you are taking PrEP.  A one-time screening for abdominal aortic aneurysm (AAA) and surgical repair of large AAAs by ultrasound are recommended for men ages 65 to 75 years who are current or former smokers.  Healthy men should no longer receive prostate-specific antigen (PSA) blood tests as part of routine cancer screening. Talk with your health care provider about prostate cancer screening.  Testicular cancer screening is not recommended for adult males who have no symptoms. Screening includes self-exam, a health care provider exam, and other screening tests. Consult with your health care provider about any symptoms you have or any concerns you have about testicular cancer.  Use sunscreen. Apply sunscreen liberally and repeatedly throughout the day. You should seek shade when your shadow is shorter than you. Protect yourself by wearing long sleeves, pants, a wide-brimmed hat, and sunglasses year round, whenever you are outdoors.  Once a month, do a whole-body skin exam, using a mirror to look at the skin on your back. Tell your health care provider about new moles, moles that have irregular borders, moles that are larger than a pencil eraser, or moles that have changed in shape or color.  Stay current with required vaccines (immunizations).  Influenza vaccine. All adults should be immunized every year.  Tetanus, diphtheria, and acellular pertussis (Td, Tdap) vaccine. An adult who has not previously received Tdap or who does not know his vaccine status should receive 1 dose of Tdap.  This initial dose should be followed by tetanus and diphtheria toxoids (Td) booster doses every 10 years. Adults with an unknown or incomplete history of completing a 3-dose immunization series with Td-containing vaccines should begin or complete a primary immunization series including a Tdap dose. Adults should receive a Td booster every 10 years.  Varicella vaccine. An adult without evidence of immunity to varicella should receive 2 doses or a second dose if he has previously received 1 dose.  Human papillomavirus (HPV) vaccine. Males aged 13-21   years who have not received the vaccine previously should receive the 3-dose series. Males aged 22-26 years may be immunized. Immunization is recommended through the age of 26 years for any male who has sex with males and did not get any or all doses earlier. Immunization is recommended for any person with an immunocompromised condition through the age of 26 years if he did not get any or all doses earlier. During the 3-dose series, the second dose should be obtained 4-8 weeks after the first dose. The third dose should be obtained 24 weeks after the first dose and 16 weeks after the second dose.  Zoster vaccine. One dose is recommended for adults aged 60 years or older unless certain conditions are present.  Measles, mumps, and rubella (MMR) vaccine. Adults born before 1957 generally are considered immune to measles and mumps. Adults born in 1957 or later should have 1 or more doses of MMR vaccine unless there is a contraindication to the vaccine or there is laboratory evidence of immunity to each of the three diseases. A routine second dose of MMR vaccine should be obtained at least 28 days after the first dose for students attending postsecondary schools, health care workers, or international travelers. People who received inactivated measles vaccine or an unknown type of measles vaccine during 1963-1967 should receive 2 doses of MMR vaccine. People who received  inactivated mumps vaccine or an unknown type of mumps vaccine before 1979 and are at high risk for mumps infection should consider immunization with 2 doses of MMR vaccine. Unvaccinated health care workers born before 1957 who lack laboratory evidence of measles, mumps, or rubella immunity or laboratory confirmation of disease should consider measles and mumps immunization with 2 doses of MMR vaccine or rubella immunization with 1 dose of MMR vaccine.  Pneumococcal 13-valent conjugate (PCV13) vaccine. When indicated, a person who is uncertain of his immunization history and has no record of immunization should receive the PCV13 vaccine. An adult aged 19 years or older who has certain medical conditions and has not been previously immunized should receive 1 dose of PCV13 vaccine. This PCV13 should be followed with a dose of pneumococcal polysaccharide (PPSV23) vaccine. The PPSV23 vaccine dose should be obtained at least 8 weeks after the dose of PCV13 vaccine. An adult aged 19 years or older who has certain medical conditions and previously received 1 or more doses of PPSV23 vaccine should receive 1 dose of PCV13. The PCV13 vaccine dose should be obtained 1 or more years after the last PPSV23 vaccine dose.  Pneumococcal polysaccharide (PPSV23) vaccine. When PCV13 is also indicated, PCV13 should be obtained first. All adults aged 65 years and older should be immunized. An adult younger than age 65 years who has certain medical conditions should be immunized. Any person who resides in a nursing home or long-term care facility should be immunized. An adult smoker should be immunized. People with an immunocompromised condition and certain other conditions should receive both PCV13 and PPSV23 vaccines. People with human immunodeficiency virus (HIV) infection should be immunized as soon as possible after diagnosis. Immunization during chemotherapy or radiation therapy should be avoided. Routine use of PPSV23 vaccine is  not recommended for American Indians, [place] Natives, or people younger than 65 years unless there are medical conditions that require PPSV23 vaccine. When indicated, people who have unknown immunization and have no record of immunization should receive PPSV23 vaccine. One-time revaccination 5 years after the first dose of PPSV23 is recommended for people aged 19-64   years who have chronic kidney failure, nephrotic syndrome, asplenia, or immunocompromised conditions. People who received 1-2 doses of PPSV23 before age 65 years should receive another dose of PPSV23 vaccine at age 65 years or later if at least 5 years have passed since the previous dose. Doses of PPSV23 are not needed for people immunized with PPSV23 at or after age 65 years.  Meningococcal vaccine. Adults with asplenia or persistent complement component deficiencies should receive 2 doses of quadrivalent meningococcal conjugate (MenACWY-D) vaccine. The doses should be obtained at least 2 months apart. Microbiologists working with certain meningococcal bacteria, military recruits, people at risk during an outbreak, and people who travel to or live in countries with a high rate of meningitis should be immunized. A first-year college student up through age 21 years who is living in a residence hall should receive a dose if he did not receive a dose on or after his 16th birthday. Adults who have certain high-risk conditions should receive one or more doses of vaccine.  Hepatitis A vaccine. Adults who wish to be protected from this disease, have certain high-risk conditions, work with hepatitis A-infected animals, work in hepatitis A research labs, or travel to or work in countries with a high rate of hepatitis A should be immunized. Adults who were previously unvaccinated and who anticipate close contact with an international adoptee during the first 60 days after arrival in the [place] from a country with a high rate of hepatitis A should be  immunized.  Hepatitis B vaccine. Adults should be immunized if they wish to be protected from this disease, have certain high-risk conditions, may be exposed to blood or other infectious body fluids, are household contacts or sex partners of hepatitis B positive people, are clients or workers in certain care facilities, or travel to or work in countries with a high rate of hepatitis B.  Haemophilus influenzae type b (Hib) vaccine. A previously unvaccinated person with asplenia or sickle cell disease or having a scheduled splenectomy should receive 1 dose of Hib vaccine. Regardless of previous immunization, a recipient of a hematopoietic stem cell transplant should receive a 3-dose series 6-12 months after his successful transplant. Hib vaccine is not recommended for adults with HIV infection. Preventive Service / Frequency Ages 19 to 39  Blood pressure check.** / Every 1 to 2 years.  Lipid and cholesterol check.** / Every 5 years beginning at age 20.  Hepatitis C blood test.** / For any individual with known risks for hepatitis C.  Skin self-exam. / Monthly.  Influenza vaccine. / Every year.  Tetanus, diphtheria, and acellular pertussis (Tdap, Td) vaccine.** / Consult your health care provider. 1 dose of Td every 10 years.  Varicella vaccine.** / Consult your health care provider.  HPV vaccine. / 3 doses over 6 months, if 26 or younger.  Measles, mumps, rubella (MMR) vaccine.** / You need at least 1 dose of MMR if you were born in 1957 or later. You may also need a second dose.  Pneumococcal 13-valent conjugate (PCV13) vaccine.** / Consult your health care provider.  Pneumococcal polysaccharide (PPSV23) vaccine.** / 1 to 2 doses if you smoke cigarettes or if you have certain conditions.  Meningococcal vaccine.** / 1 dose if you are age 19 to 21 years and a first-year college student living in a residence hall, or have one of several medical conditions. You may also need additional  booster doses.  Hepatitis A vaccine.** / Consult your health care provider.  Hepatitis B vaccine.** /   Consult your health care provider.  Haemophilus influenzae type b (Hib) vaccine.** / Consult your health care provider. Ages 40 to 64  Blood pressure check.** / Every 1 to 2 years.  Lipid and cholesterol check.** / Every 5 years beginning at age 20.  Lung cancer screening. / Every year if you are aged 55-80 years and have a 30-pack-year history of smoking and currently smoke or have quit within the past 15 years. Yearly screening is stopped once you have quit smoking for at least 15 years or develop a health problem that would prevent you from having lung cancer treatment.  Fecal occult blood test (FOBT) of stool. / Every year beginning at age 50 and continuing until age 75. You may not have to do this test if you get a colonoscopy every 10 years.  Flexible sigmoidoscopy** or colonoscopy.** / Every 5 years for a flexible sigmoidoscopy or every 10 years for a colonoscopy beginning at age 50 and continuing until age 75.  Hepatitis C blood test.** / For all people born from 1945 through 1965 and any individual with known risks for hepatitis C.  Skin self-exam. / Monthly.  Influenza vaccine. / Every year.  Tetanus, diphtheria, and acellular pertussis (Tdap/Td) vaccine.** / Consult your health care provider. 1 dose of Td every 10 years.  Varicella vaccine.** / Consult your health care provider.  Zoster vaccine.** / 1 dose for adults aged 60 years or older.  Measles, mumps, rubella (MMR) vaccine.** / You need at least 1 dose of MMR if you were born in 1957 or later. You may also need a second dose.  Pneumococcal 13-valent conjugate (PCV13) vaccine.** / Consult your health care provider.  Pneumococcal polysaccharide (PPSV23) vaccine.** / 1 to 2 doses if you smoke cigarettes or if you have certain conditions.  Meningococcal vaccine.** / Consult your health care provider.  Hepatitis A  vaccine.** / Consult your health care provider.  Hepatitis B vaccine.** / Consult your health care provider.  Haemophilus influenzae type b (Hib) vaccine.** / Consult your health care provider. Ages 65 and over  Blood pressure check.** / Every 1 to 2 years.  Lipid and cholesterol check.**/ Every 5 years beginning at age 20.  Lung cancer screening. / Every year if you are aged 55-80 years and have a 30-pack-year history of smoking and currently smoke or have quit within the past 15 years. Yearly screening is stopped once you have quit smoking for at least 15 years or develop a health problem that would prevent you from having lung cancer treatment.  Fecal occult blood test (FOBT) of stool. / Every year beginning at age 50 and continuing until age 75. You may not have to do this test if you get a colonoscopy every 10 years.  Flexible sigmoidoscopy** or colonoscopy.** / Every 5 years for a flexible sigmoidoscopy or every 10 years for a colonoscopy beginning at age 50 and continuing until age 75.  Hepatitis C blood test.** / For all people born from 1945 through 1965 and any individual with known risks for hepatitis C.  Abdominal aortic aneurysm (AAA) screening.** / A one-time screening for ages 65 to 75 years who are current or former smokers.  Skin self-exam. / Monthly.  Influenza vaccine. / Every year.  Tetanus, diphtheria, and acellular pertussis (Tdap/Td) vaccine.** / 1 dose of Td every 10 years.  Varicella vaccine.** / Consult your health care provider.  Zoster vaccine.** / 1 dose for adults aged 60 years or older.  Pneumococcal 13-valent conjugate (PCV13)   vaccine.** / Consult your health care provider.  Pneumococcal polysaccharide (PPSV23) vaccine.** / 1 dose for all adults aged 65 years and older.  Meningococcal vaccine.** / Consult your health care provider.  Hepatitis A vaccine.** / Consult your health care provider.  Hepatitis B vaccine.** / Consult your health care  provider.  Haemophilus influenzae type b (Hib) vaccine.** / Consult your health care provider. **Family history and personal history of risk and conditions may change your health care provider's recommendations. Document Released: 09/11/2001 Document Revised: 07/21/2013 Document Reviewed: 12/11/2010 ExitCare Patient Information 2015 [employer]. This information is not intended to replace advice given to you by your health care provider. Make sure you discuss any questions you have with your health care provider.  

## 2014-12-21 NOTE — Progress Notes (Signed)
Subjective:   [name] is a 74 y.o. male who presents for an Initial Medicare Annual Wellness Visit. [name] is married and lives with his wife and has one daughter. He has been retired for 15 years from [employer] with over 28 years of service in [place]. He reports he is doing well today without complaints. He does have an appt with the [facility] in [place] to have bilateral hearing aides next month.  Review of Systems   Cardiac Risk Factors include: advanced age (>62men, >23 women);dyslipidemia;hypertension;male gender;smoking/ tobacco exposure    Objective:    Today's Vitals   12/21/14 0904  BP: 120/63  Pulse: 56  Resp: 20  Height: 5\' 11"  (1.803 m)  Weight: 210 lb (95.255 kg)    Current Medications (verified) Outpatient Encounter Prescriptions as of 12/21/2014  Medication Sig  . aspirin 81 MG tablet Take 81 mg by mouth daily.    Marland Kitchen atorvastatin (LIPITOR) 40 MG tablet TAKE 1 TABLET DAILY  . clopidogrel (PLAVIX) 75 MG tablet TAKE 1 TABLET DAILY  . Glucosamine HCl 1500 MG TABS Take 1 tablet by mouth 1 day or 1 dose.   Marland Kitchen lisinopril (PRINIVIL,ZESTRIL) 40 MG tablet TAKE 1 TABLET DAILY  . Multiple Vitamins-Minerals (CENTRUM SILVER PO) Take 1 capsule by mouth daily.    . nitroGLYCERIN (NITROSTAT) 0.4 MG SL tablet DISSOLVE 1 TABLET UNDER TONGUE AS NEEDED FOR CHEST PAIN   No facility-administered encounter medications on file as of 12/21/2014.    Allergies (verified) Review of patient's allergies indicates no known allergies.   History: Past Medical History  Diagnosis Date  . 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 Other and unspecified hyperlipidemia   . Unspecified essential hypertension   . Obesity, unspecified   . Obstructive sleep apnea (adult) (pediatric)   . Hx of colonic polyps    Past Surgical History  Procedure Laterality Date  . Coronary stent placement  08/2006    LAD  . Colonoscopy  9/17/20012  .  Polypectomy  09/17/20012  . Tonsillectomy     Family History  Problem Relation Age of Onset  . Colon cancer Neg Hx   . Heart disease Father     and Multiple family members on father Side   . Irritable bowel syndrome Maternal Grandmother   . Cancer Mother   . Hypertension Mother   . Hypertension Sister    Social History   Occupational History  . Nurse, learning disability     Retired   . 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 Smoking status: Former Smoker -- 1.00 packs/day    Types: Cigarettes    Quit date: 03/06/1971  . Smokeless tobacco: Former Systems developer    Types: [place] date: 07/30/1989  . Alcohol Use: No     Comment: 1 drink daily   . Drug Use: Not on file  . Sexual Activity: No   Tobacco Counseling NA   Activities of Daily Living In your present state of health, do you have any difficulty performing the following activities: 12/21/2014  Hearing? Y  Vision? Y  Difficulty concentrating or making decisions? N  Walking or climbing stairs? N  Dressing or bathing? N  Doing errands, shopping? N  Preparing Food and eating ? N  Using the Toilet? N  In the past six months, have you accidently leaked urine? N  Do you  have problems with loss of bowel control? N  Managing your Medications? N  Managing your Finances? N  Housekeeping or managing your Housekeeping? N    Immunizations and Health Maintenance Immunization History  Administered Date(s) Administered  . Influenza Split 04/29/2013  . Influenza-Unspecified 05/26/2014  . Pneumococcal Conjugate-13 06/14/2014  . Pneumococcal Polysaccharide-23 06/09/2010  . Tdap 01/12/2011  . Zoster 10/29/2014   There are no preventive care reminders to display for this patient.  Patient Care Team: Jared Honour, MD as PCP - General (Family Medicine) Jared Dresser, MD as Consulting Physician (Cardiology) Jared Shipper, MD as Consulting Physician (Gastroenterology)  Indicate any recent Medical  Services you may have received from other than [facility] providers in the past year (date may be approximate).    Assessment:   This is a routine wellness examination for [name].   Hearing/Vision screen Had appointment for his vision at the [facility] in [place] in April 2016  he does wear glasses to drive but is having a hard time getting adjusted to them. He has an appt next month at the [facility] to have hearing aides placed.  Dietary issues and exercise activities discussed: Current Exercise Habits:: Home exercise routine, Type of exercise: treadmill;strength training/weights, Time (Minutes): > 60, Frequency (Times/Week): 3, Weekly Exercise (Minutes/Week): 0, Intensity: Moderate  Goals    None     Depression Screen PHQ 2/9 Scores 12/21/2014 07/21/2014 11/17/2013  PHQ - 2 Score 0 0 0    Fall Risk Fall Risk  12/21/2014 07/21/2014 11/17/2013  Falls in the past year? No No No    Cognitive Function: MMSE - Mini Mental State Exam 12/21/2014  Orientation to time 5  Orientation to Place 5  Registration 3  Attention/ Calculation 5  Recall 3  Language- name 2 objects 2  Language- repeat 1  Language- follow 3 step command 3  Language- read & follow direction 1  Write a sentence 1  Copy design 1  Total score 30    Screening Tests Health Maintenance  Topic Date Due  . INFLUENZA VACCINE  02/28/2015  . COLONOSCOPY  04/15/2016  . TETANUS/TDAP  01/11/2021  . ZOSTAVAX  Completed  . PNA vac Low Risk Adult  Completed        Plan:  Continue current medications as ordered Continue with current exercise program which includes going to the local [employer] and using the treadmill and lifting weights 3 days a week Continue therapeutic lifestyle changes which include good diet and exercise Discussed Advanced Directives and information given to pt  Return FOBT in the next few weeks. Pt instructed on use of the FOBT and verbalizes understanding Keep follow up appointment with Dr. Sabra Heck 06/14/15   During  the course of the visit [name] was educated and counseled about the following appropriate screening and preventive services:   Vaccines to include Pneumoccal, Influenza, Tdap, Zostavax, : all vaccines are up to date  Electrocardiogram last noted 09/09/14 followed by Dr. Marigene Ehlers Cardiologist  Colorectal cancer screening last colonoscopy 04/16/2011 with 3 polyps removed  Pt is to return in 03/2016 for repeat colonoscopy  FOBT given to patient  Cardiovascular disease screening last lipids at goal 09/09/14 followed by Dr Marigene Ehlers  Diabetes screening last glucose 106 on 09/09/14  Glaucoma screening: last eye exam at [facility] pt will ask for a copy when he goes in June for his hearing aid placements  Nutrition counseling DASH diet recommended   Prostate cancer screening last PSA normal 2.5 07/21/2014 Smoking cessation  counseling NA  Patient Instructions (the written plan) were given to the patient.   Joneen Boers, RN   12/21/2014       I have reviewed and agree with the above AWV documentation.  Claretta Fraise, M.D.

## 2014-12-22 ENCOUNTER — Other Ambulatory Visit: Payer: Medicare Other

## 2014-12-22 DIAGNOSIS — Z1212 Encounter for screening for malignant neoplasm of rectum: Secondary | ICD-10-CM | POA: Diagnosis not present

## 2014-12-23 ENCOUNTER — Other Ambulatory Visit: Payer: Self-pay | Admitting: *Deleted

## 2014-12-23 MED ORDER — LISINOPRIL 40 MG PO TABS: 40.0000 mg | ORAL_TABLET | Freq: Every day | ORAL | Status: DC

## 2014-12-25 LAB — FECAL OCCULT BLOOD, IMMUNOCHEMICAL: FECAL OCCULT BLD: POSITIVE — AB

## 2014-12-29 ENCOUNTER — Telehealth: Payer: Self-pay | Admitting: Family Medicine

## 2014-12-29 NOTE — Telephone Encounter (Signed)
Patient states his last colonoscopy was 4 years ago. Do you want to repeat the FOBT?

## 2014-12-30 NOTE — Telephone Encounter (Signed)
Okay to repeat FOBT

## 2014-12-30 NOTE — Telephone Encounter (Signed)
Patient aware that he should repeat FOBT and one has been put up front for patient.

## 2015-01-05 ENCOUNTER — Other Ambulatory Visit: Payer: Medicare Other

## 2015-01-05 DIAGNOSIS — Z1212 Encounter for screening for malignant neoplasm of rectum: Secondary | ICD-10-CM | POA: Diagnosis not present

## 2015-01-06 LAB — FECAL OCCULT BLOOD, IMMUNOCHEMICAL: FECAL OCCULT BLD: POSITIVE — AB

## 2015-01-07 ENCOUNTER — Encounter: Payer: Self-pay | Admitting: *Deleted

## 2015-01-07 NOTE — Telephone Encounter (Signed)
Discuss recent lab

## 2015-01-07 NOTE — Telephone Encounter (Signed)
-----   Message from Wardell Honour, MD sent at 01/07/2015  7:48 AM EDT ----- Hemoccults is again positive. This might be blamed on internal hemorrhoids but I will give patient option of following up with gastroenterology to see if colonoscopy is warranted

## 2015-01-10 ENCOUNTER — Telehealth: Payer: Self-pay | Admitting: Internal Medicine

## 2015-01-10 NOTE — Telephone Encounter (Signed)
Called pt back with positive hemoccult cards, Pt saw Dr. Henrene Pastor at [facility] back 03/2011 he will give them a call about being seen.

## 2015-01-10 NOTE — Telephone Encounter (Signed)
Pt had 2 positive hemoccult tests with his PCP. Pt states his PCP told him to check with Dr. Henrene Pastor and see if he thinks he needs to have his colon done now instead of waiting until 2017. Pt does have history of hemorrhoids. Please advise.

## 2015-01-10 NOTE — Telephone Encounter (Signed)
Labs in epic. Pt scheduled to see Dr. Henrene Pastor tomorrow at 1:30pm.

## 2015-01-10 NOTE — Telephone Encounter (Signed)
Error

## 2015-01-10 NOTE — Telephone Encounter (Signed)
Make routine office appointment to discuss. Get Hemoccults results and recent laboratories for review as well

## 2015-01-11 ENCOUNTER — Encounter: Payer: Self-pay | Admitting: Internal Medicine

## 2015-01-11 ENCOUNTER — Ambulatory Visit (INDEPENDENT_AMBULATORY_CARE_PROVIDER_SITE_OTHER): Payer: Medicare Other | Admitting: Internal Medicine

## 2015-01-11 VITALS — BP 100/60 | HR 60 | Ht 71.0 in | Wt 205.2 lb

## 2015-01-11 DIAGNOSIS — R7989 Other specified abnormal findings of blood chemistry: Secondary | ICD-10-CM | POA: Diagnosis not present

## 2015-01-11 DIAGNOSIS — I2510 Atherosclerotic heart disease of native coronary artery without angina pectoris: Secondary | ICD-10-CM

## 2015-01-11 DIAGNOSIS — R195 Other fecal abnormalities: Secondary | ICD-10-CM | POA: Diagnosis not present

## 2015-01-11 DIAGNOSIS — Z860100 Personal history of colon polyps, unspecified: Secondary | ICD-10-CM

## 2015-01-11 DIAGNOSIS — Z955 Presence of coronary angioplasty implant and graft: Secondary | ICD-10-CM

## 2015-01-11 DIAGNOSIS — R945 Abnormal results of liver function studies: Secondary | ICD-10-CM

## 2015-01-11 MED ORDER — NA SULFATE-K SULFATE-MG SULF 17.5-3.13-1.6 GM/177ML PO SOLN: 1.0000 | Freq: Once | ORAL | Status: DC

## 2015-01-11 NOTE — Progress Notes (Signed)
HISTORY OF PRESENT ILLNESS:  [name] is a 74 y.o. male with hypertension, hyperlipidemia, obesity, and coronary artery disease with prior coronary artery stent placement for which she has been on Plavix and aspirin. The patient is sent today by his primary provider, Dr. Sabra Heck, regarding Hemoccult-positive stool on 2 separate occasions as part of a "wellness check". The patient was last evaluated in this office September 2012 regarding surveillance colonoscopy. He was on aspirin and Plavix at that time. Plavix was held 5 days prior to the examination with aspirin continued (endorsed by his cardiologist). Subsequent complete colonoscopy was performed 04/16/2011. The patient was found to have 3 tubular adenomas which were removed. In addition moderate sigmoid diverticulosis. Follow-up in 5 years recommended. Patient states that he has intermittent troubles with hemorrhoids. However, no significant regular gross bleeding or melena. He has had some weight loss which she attributes to diet and exercise. He is not take PPI. Review of outside laboratories from February 2016 reveals normal basic metabolic panel. Mild elevation of hepatic transaminases with normal bilirubin. Unremarkable CBC with hemoglobin 14.3.  REVIEW OF SYSTEMS:  All non-GI ROS negative except for  Past Medical History  Diagnosis Date  . 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 Other and unspecified hyperlipidemia   . Unspecified essential hypertension   . Obesity, unspecified   . Obstructive sleep apnea (adult) (pediatric)   . Hx of colonic polyps   . Diverticulosis   . Hepatomegaly     Past Surgical History  Procedure Laterality Date  . Coronary stent placement  08/2006    LAD  . Colonoscopy  9/17/20012  . Polypectomy  09/17/20012  . Tonsillectomy      Social History [name]  reports that he quit smoking about 43 years ago. His smoking use included Cigarettes. He smoked 1.00  pack per day. He quit smokeless tobacco use about 25 years ago. His smokeless tobacco use included Chew. He reports that he does not drink alcohol. His drug history is not on file.  family history includes Cancer in his mother; Heart disease in his father; Hypertension in his mother and sister; Irritable bowel syndrome in his maternal grandmother. There is no history of Colon cancer.  No Known Allergies     PHYSICAL EXAMINATION: Vital signs: BP 100/60 mmHg  Pulse 60  Ht 5\' 11"  (1.803 m)  Wt 205 lb 3.2 oz (93.078 kg)  BMI 28.63 kg/m2  Constitutional: generally well-appearing, no acute distress Psychiatric: alert and oriented x3, cooperative Eyes: extraocular movements intact, anicteric, conjunctiva pink Mouth: oral pharynx moist, no lesions Neck: supple no lymphadenopathy Cardiovascular: heart regular rate and rhythm, no murmur Lungs: clear to auscultation bilaterally Abdomen: soft, obese, nontender, nondistended, no obvious ascites, no peritoneal signs, normal bowel sounds, no organomegaly Rectal: Deferred until colonoscopy Extremities: no lower extremity edema bilaterally Skin: no lesions on visible extremities Neuro: No focal deficits. No asterixis.   ASSESSMENT:  #1. Hemoccult-positive stool. Asymptomatic with normal hemoglobin. #2. History of adenomatous colon polyps. Index colonoscopy elsewhere 20 years ago. Most recent examination here September 2012 with adenomas 3 and diverticulosis #3. Multiple medical problems including a history of stable coronary artery disease with remote stent placement for which she is on aspirin and Plavix #4. Mild elevation of hepatic transaminases. Likely fatty liver   PLAN:  #1. Colonoscopy to evaluate Hemoccult-positive stool.The nature of the procedure, as well as the risks, benefits, and alternatives were carefully and thoroughly reviewed with the  patient. Ample time for discussion and questions allowed. The patient understood, was  satisfied, and agreed to proceed. #2. Upper endoscopy to evaluate Hemoccult-positive stool.The nature of the procedure, as well as the risks, benefits, and alternatives were carefully and thoroughly reviewed with the patient. Ample time for discussion and questions allowed. The patient understood, was satisfied, and agreed to proceed. #3. High risk patient with comorbidities and the need to manage antiplatelets therapy. Recommend holding Plavix 5 days prior to the procedure and continuing aspirin throughout as was done previously with the endorsement of his cardiologist #4. May need PPI if upper GI mucosal lesions. May be reasonable to recommend PPI without GI mucosal lesions given chronic need for PPI and aspirin #5. Continue exercise and diet  A copy of this consultation note has been sent to Dr. Sabra Heck

## 2015-01-11 NOTE — Patient Instructions (Signed)
You have been scheduled for an endoscopy and colonoscopy. Please follow the written instructions given to you at your visit today. Please pick up your prep supplies at the pharmacy within the next 1-3 days. If you use inhalers (even only as needed), please bring them with you on the day of your procedure.  

## 2015-01-18 ENCOUNTER — Ambulatory Visit: Payer: Medicare Other | Admitting: Family Medicine

## 2015-03-04 ENCOUNTER — Other Ambulatory Visit: Payer: Self-pay | Admitting: *Deleted

## 2015-03-04 MED ORDER — CLOPIDOGREL BISULFATE 75 MG PO TABS: 75.0000 mg | ORAL_TABLET | Freq: Every day | ORAL | Status: DC

## 2015-03-04 MED ORDER — ATORVASTATIN CALCIUM 40 MG PO TABS: 40.0000 mg | ORAL_TABLET | Freq: Every day | ORAL | Status: DC

## 2015-03-04 MED ORDER — LISINOPRIL 40 MG PO TABS: 40.0000 mg | ORAL_TABLET | Freq: Every day | ORAL | Status: DC

## 2015-03-24 ENCOUNTER — Ambulatory Visit (AMBULATORY_SURGERY_CENTER): Payer: Medicare Other | Admitting: Internal Medicine

## 2015-03-24 ENCOUNTER — Encounter: Payer: Self-pay | Admitting: Internal Medicine

## 2015-03-24 VITALS — BP 109/50 | HR 56 | Temp 97.7°F | Resp 14 | Ht 71.0 in | Wt 205.0 lb

## 2015-03-24 DIAGNOSIS — D123 Benign neoplasm of transverse colon: Secondary | ICD-10-CM | POA: Diagnosis not present

## 2015-03-24 DIAGNOSIS — K635 Polyp of colon: Secondary | ICD-10-CM | POA: Diagnosis not present

## 2015-03-24 DIAGNOSIS — D125 Benign neoplasm of sigmoid colon: Secondary | ICD-10-CM | POA: Diagnosis not present

## 2015-03-24 DIAGNOSIS — R195 Other fecal abnormalities: Secondary | ICD-10-CM | POA: Diagnosis present

## 2015-03-24 DIAGNOSIS — Z86010 Personal history of colonic polyps: Secondary | ICD-10-CM

## 2015-03-24 DIAGNOSIS — I10 Essential (primary) hypertension: Secondary | ICD-10-CM | POA: Diagnosis not present

## 2015-03-24 MED ORDER — SODIUM CHLORIDE 0.9 % IV SOLN: 500.0000 mL | INTRAVENOUS | Status: DC

## 2015-03-24 NOTE — Patient Instructions (Signed)

## 2015-03-24 NOTE — Progress Notes (Signed)
Called to room to assist during endoscopic procedure.  Patient ID and intended procedure confirmed with present staff. Received instructions for my participation in the procedure from the performing physician.  

## 2015-03-24 NOTE — Op Note (Signed)
[place]  [address]. [place], [address]   COLONOSCOPY PROCEDURE REPORT  PATIENT: [name], [name]  MR#: [id number] BIRTHDATE: 27-Jun-1941 , 74  yrs. old GENDER: male ENDOSCOPIST: Eustace Quail, MD REFERRED OE:[id number] Sabra Heck, M.D. PROCEDURE DATE:  03/24/2015 PROCEDURE:   Colonoscopy, diagnostic and Colonoscopy with snare polypectomy X3 First Screening Colonoscopy - Avg.  risk and is 50 yrs.  old or older - No.  Prior Negative Screening - Now for repeat screening. N/A  History of Adenoma - Now for follow-up colonoscopy & has been > or = to 3 yrs.  Yes hx of adenoma.  Has been 3 or more years since last colonoscopy.  Polyps removed today? Yes ASA CLASS:   Class III INDICATIONS:heme-positive stool.Marland Kitchen History of adenomatous polyps. Multiple prior colonoscopies. Last exam 2012 with 3 adenomas MEDICATIONS: Monitored anesthesia care and Propofol 250 mg IV  DESCRIPTION OF PROCEDURE:   After the risks benefits and alternatives of the procedure were thoroughly explained, informed consent was obtained.  The digital rectal exam revealed no abnormalities of the rectum.   The LB [id number]  endoscope was introduced through the anus and advanced to the cecum, which was identified by both the appendix and ileocecal valve. No adverse events experienced.   The quality of the prep was good.  (Suprep was used)  The instrument was then slowly withdrawn as the colon was fully examined. Estimated blood loss is zero unless otherwise noted in this procedure report.  COLON FINDINGS: Three polyps were found in the sigmoid colon (3 mm, 3 mm) and transverse colon(60mm).  A polypectomy was performed with a cold snare.  The resection was complete, the polyp tissue was completely retrieved and sent to histology.   There was moderate diverticulosis noted in the sigmoid colon.   The examination was otherwise normal.  Retroflexed views revealed internal hemorrhoids. The time to cecum =  0.5 Withdrawal time = 14.0   The scope was withdrawn and the procedure completed. COMPLICATIONS: There were no immediate complications.  ENDOSCOPIC IMPRESSION: 1.   Three polyps were found in the sigmoid colon and transverse colon; polypectomy was performed with a cold snare 2.   Moderate diverticulosis was noted in the sigmoid colon 3.   The examination was otherwise normal  RECOMMENDATIONS: 1.  Follow up colonoscopy in 5 years, medically fit 2.  Upper endoscopy today (please see report) 3. Resume Plavix today  eSigned:  Eustace Quail, MD 03/24/2015 8:38 AM   cc: Alain Honey, MD and The Patient

## 2015-03-24 NOTE — Progress Notes (Signed)
To recovery, report to Mir[place], RN, VSS. 

## 2015-03-24 NOTE — Op Note (Signed)
[place]  [address]. [place], [address]   ENDOSCOPY PROCEDURE REPORT  PATIENT: Jared, Ward  MR#: [id number] BIRTHDATE: January 16, 1941 , 59  yrs. old GENDER: male ENDOSCOPIST: Eustace Quail, MD REFERRED BY:  Alain Honey, M.D. PROCEDURE DATE:  03/24/2015 PROCEDURE:  EGD, diagnostic ASA CLASS:     Class III INDICATIONS:  occult blood positive. MEDICATIONS: Monitored anesthesia care and Propofol 70 mg IV TOPICAL ANESTHETIC: none  DESCRIPTION OF PROCEDURE: After the risks benefits and alternatives of the procedure were thoroughly explained, informed consent was obtained.  The LB [id number] endoscope was introduced through the mouth and advanced to the second portion of the duodenum , Without limitations.  The instrument was slowly withdrawn as the mucosa was fully examined.    EXAM: The esophagus and gastroesophageal junction were completely normal in appearance.  The stomach was entered and closely examined.The antrum, angularis, and lesser curvature were well visualized, including a retroflexed view of the cardia and fundus. The stomach wall was normally distensable.  The scope passed easily through the pylorus into the duodenum.  Retroflexed views revealed no abnormalities.     The scope was then withdrawn from the patient and the procedure completed.  COMPLICATIONS: There were no immediate complications.  ENDOSCOPIC IMPRESSION: 1. Normal EGD  RECOMMENDATIONS: 1. Resume Plavix today 2. Return to the care of Dr. Sabra Heck  REPEAT EXAM:  eSigned:  Eustace Quail, MD 03/24/2015 8:42 AM    CC:The Patient and Alain Honey, MD

## 2015-03-25 ENCOUNTER — Telehealth: Payer: Self-pay | Admitting: *Deleted

## 2015-03-25 NOTE — Telephone Encounter (Signed)
  Follow up Call-  Call back number 03/24/2015  Post procedure Call Back phone  # [phone]  Permission to leave phone message Yes     Patient questions:  Phone just kept ringing; no vm noted.

## 2015-03-29 ENCOUNTER — Encounter: Payer: Self-pay | Admitting: Internal Medicine

## 2015-05-24 DIAGNOSIS — G4733 Obstructive sleep apnea (adult) (pediatric): Secondary | ICD-10-CM | POA: Diagnosis not present

## 2015-06-14 ENCOUNTER — Ambulatory Visit: Payer: Medicare Other | Admitting: Family Medicine

## 2015-06-22 ENCOUNTER — Ambulatory Visit: Payer: Medicare Other | Admitting: Family Medicine

## 2015-06-28 ENCOUNTER — Encounter: Payer: Self-pay | Admitting: Family Medicine

## 2015-06-28 ENCOUNTER — Ambulatory Visit (INDEPENDENT_AMBULATORY_CARE_PROVIDER_SITE_OTHER): Payer: Medicare Other | Admitting: Family Medicine

## 2015-06-28 VITALS — BP 103/51 | HR 58 | Temp 97.5°F | Ht 71.0 in | Wt 207.8 lb

## 2015-06-28 DIAGNOSIS — I10 Essential (primary) hypertension: Secondary | ICD-10-CM

## 2015-06-28 DIAGNOSIS — E785 Hyperlipidemia, unspecified: Secondary | ICD-10-CM

## 2015-06-28 NOTE — Progress Notes (Signed)
   Subjective:    Patient ID: [name], male    DOB: 12-30-1940, 74 y.o.   MRN: [id number]  HPI 74 year old gentleman here to follow-up blood pressure and lipids. He had a stent put in coronary arteries about 10 years ago still takes Plavix. He has nitroglycerin but has not used at in recent memory. He has no specific complaints today.  Patient Active Problem List   Diagnosis Date Noted  . ETD (eustachian tube dysfunction) 07/07/2013  . Fatty liver disease, nonalcoholic [id number]  . Internal hemorrhoids 03/18/2013  . Abnormal transaminases 11/25/2012  . Metabolic syndrome [id number]  . Acute hemorrhoid 11/11/2012  . Murmur 10/24/2011  . EDEMA 03/07/2009  . HEMATOMA 01/05/2009  . UNSPECIFIED CHRONIC ISCHEMIC HEART DISEASE 12/22/2008  . Hyperlipidemia 12/21/2008  . OBESITY 12/21/2008  . SLEEP APNEA, OBSTRUCTIVE 12/21/2008  . Essential hypertension 12/21/2008  . Coronary atherosclerosis 12/21/2008   Outpatient Encounter Prescriptions as of 06/28/2015  Medication Sig  . aspirin 81 MG tablet Take 81 mg by mouth daily.    Marland Kitchen atorvastatin (LIPITOR) 40 MG tablet Take 1 tablet (40 mg total) by mouth daily.  . clopidogrel (PLAVIX) 75 MG tablet Take 1 tablet (75 mg total) by mouth daily.  Marland Kitchen lisinopril (PRINIVIL,ZESTRIL) 40 MG tablet Take 1 tablet (40 mg total) by mouth daily.  . Multiple Vitamins-Minerals (CENTRUM SILVER PO) Take 1 capsule by mouth daily.    . nitroGLYCERIN (NITROSTAT) 0.4 MG SL tablet DISSOLVE 1 TABLET UNDER TONGUE AS NEEDED FOR CHEST PAIN  . [DISCONTINUED] Glucosamine HCl 1500 MG TABS Take 1 tablet by mouth 1 day or 1 dose.    No facility-administered encounter medications on file as of 06/28/2015.      Review of Systems  Constitutional: Negative.   HENT: Negative.   Eyes: Negative.   Respiratory: Negative.  Negative for shortness of breath.   Cardiovascular: Negative.  Negative for chest pain and leg swelling.  Gastrointestinal: Negative.   Genitourinary:  Negative.   Musculoskeletal: Negative.   Skin: Negative.   Neurological: Negative.   Psychiatric/Behavioral: Negative.   All other systems reviewed and are negative.      Objective:   Physical Exam  Constitutional: He is oriented to person, place, and time. He appears well-developed and well-nourished.  Cardiovascular: Normal rate, regular rhythm and normal heart sounds.   Pulmonary/Chest: Effort normal and breath sounds normal.  Musculoskeletal: Normal range of motion.  Neurological: He is alert and oriented to person, place, and time.  Psychiatric: He has a normal mood and affect.          Assessment & Plan:  1. Essential hypertension Blood pressure is well controlled on lisinopril 40 mg blood pressure is still as low as today and last visit would consider reducing lisinopril from 40-20 mg  2. Hyperlipidemia Lipids were at goal last February and will repeat at one-year anniversary  Wardell Honour MD

## 2015-09-13 ENCOUNTER — Other Ambulatory Visit: Payer: Medicare Other

## 2015-09-13 ENCOUNTER — Other Ambulatory Visit (INDEPENDENT_AMBULATORY_CARE_PROVIDER_SITE_OTHER): Payer: Medicare Other

## 2015-09-13 DIAGNOSIS — E785 Hyperlipidemia, unspecified: Secondary | ICD-10-CM | POA: Diagnosis not present

## 2015-09-14 LAB — LIPID PANEL
CHOL/HDL RATIO: 2.5 ratio (ref 0.0–5.0)
Cholesterol, Total: 125 mg/dL (ref 100–199)
HDL: 50 mg/dL (ref >39)
LDL CALC: 60 mg/dL (ref 0–99)
TRIGLYCERIDES: 77 mg/dL (ref 0–149)
VLDL Cholesterol Cal: 15 mg/dL (ref 5–40)

## 2015-09-14 LAB — CMP14+EGFR
ALT: 25 IU/L (ref 0–44)
AST: 25 IU/L (ref 0–40)
Albumin/Globulin Ratio: 2 (ref 1.1–2.5)
Albumin: 3.9 g/dL (ref 3.5–4.8)
Alkaline Phosphatase: 85 IU/L (ref 39–117)
BUN/Creatinine Ratio: 14 (ref 10–22)
BUN: 13 mg/dL (ref 8–27)
Bilirubin Total: 0.6 mg/dL (ref 0.0–1.2)
CALCIUM: 9.4 mg/dL (ref 8.6–10.2)
CO2: 21 mmol/L (ref 18–29)
CREATININE: 0.96 mg/dL (ref 0.76–1.27)
Chloride: 101 mmol/L (ref 96–106)
GFR calc Af Amer: 90 mL/min/{1.73_m2} (ref >59)
GFR, EST NON AFRICAN AMERICAN: 78 mL/min/{1.73_m2} (ref >59)
GLOBULIN, TOTAL: 2 g/dL (ref 1.5–4.5)
Glucose: 90 mg/dL (ref 65–99)
Potassium: 4 mmol/L (ref 3.5–5.2)
Sodium: 140 mmol/L (ref 134–144)
Total Protein: 5.9 g/dL — ABNORMAL LOW (ref 6.0–8.5)

## 2015-09-15 ENCOUNTER — Other Ambulatory Visit: Payer: Medicare Other

## 2015-09-15 DIAGNOSIS — Z1212 Encounter for screening for malignant neoplasm of rectum: Secondary | ICD-10-CM | POA: Diagnosis not present

## 2015-09-15 DIAGNOSIS — Z0000 Encounter for general adult medical examination without abnormal findings: Secondary | ICD-10-CM

## 2015-09-16 LAB — FECAL OCCULT BLOOD, IMMUNOCHEMICAL: FECAL OCCULT BLD: NEGATIVE

## 2015-11-21 DIAGNOSIS — G4733 Obstructive sleep apnea (adult) (pediatric): Secondary | ICD-10-CM | POA: Diagnosis not present

## 2015-11-29 DIAGNOSIS — G4733 Obstructive sleep apnea (adult) (pediatric): Secondary | ICD-10-CM | POA: Diagnosis not present

## 2016-01-05 ENCOUNTER — Encounter: Payer: Self-pay | Admitting: Family Medicine

## 2016-01-05 ENCOUNTER — Ambulatory Visit (INDEPENDENT_AMBULATORY_CARE_PROVIDER_SITE_OTHER): Payer: Medicare Other | Admitting: Family Medicine

## 2016-01-05 VITALS — BP 125/68 | HR 67 | Temp 97.0°F | Ht 71.0 in | Wt 215.0 lb

## 2016-01-05 DIAGNOSIS — E785 Hyperlipidemia, unspecified: Secondary | ICD-10-CM

## 2016-01-05 DIAGNOSIS — I10 Essential (primary) hypertension: Secondary | ICD-10-CM | POA: Diagnosis not present

## 2016-01-05 DIAGNOSIS — I2510 Atherosclerotic heart disease of native coronary artery without angina pectoris: Secondary | ICD-10-CM

## 2016-01-05 NOTE — Progress Notes (Signed)
   Subjective:    Patient ID: [name], male    DOB: August 09, 1940, 75 y.o.   MRN: [id number]  HPI 1 here to follow-up blood pressure and lipids. He tells me he feels better now than he did 20 years ago. He has not had any chest pain nor used any nitroglycerin since he had stents placed probably 10-12 years ago. At his last visit I thought we decreased his lisinopril from 40-20 mg but he is not aware that. He is taking atorvastatin every other day now. Dosage there is 40 mg. He exercises 3 days a week for 1-1/2-2 hours at the [facility].  Patient Active Problem List   Diagnosis Date Noted  . ETD (eustachian tube dysfunction) 07/07/2013  . Fatty liver disease, nonalcoholic [id number]  . Internal hemorrhoids 03/18/2013  . Abnormal transaminases 11/25/2012  . Metabolic syndrome [id number]  . Acute hemorrhoid 11/11/2012  . Murmur 10/24/2011  . EDEMA 03/07/2009  . HEMATOMA 01/05/2009  . UNSPECIFIED CHRONIC ISCHEMIC HEART DISEASE 12/22/2008  . Hyperlipidemia 12/21/2008  . OBESITY 12/21/2008  . SLEEP APNEA, OBSTRUCTIVE 12/21/2008  . Essential hypertension 12/21/2008  . Coronary atherosclerosis 12/21/2008   Outpatient Encounter Prescriptions as of 01/05/2016  Medication Sig  . aspirin 81 MG tablet Take 81 mg by mouth daily.    Marland Kitchen atorvastatin (LIPITOR) 40 MG tablet Take 1 tablet (40 mg total) by mouth daily.  . clopidogrel (PLAVIX) 75 MG tablet Take 1 tablet (75 mg total) by mouth daily.  Marland Kitchen lisinopril (PRINIVIL,ZESTRIL) 40 MG tablet Take 1 tablet (40 mg total) by mouth daily.  . Multiple Vitamins-Minerals (CENTRUM SILVER PO) Take 1 capsule by mouth daily.    . nitroGLYCERIN (NITROSTAT) 0.4 MG SL tablet DISSOLVE 1 TABLET UNDER TONGUE AS NEEDED FOR CHEST PAIN   No facility-administered encounter medications on file as of 01/05/2016.      Review of Systems  Constitutional: Negative.   HENT: Negative.   Eyes: Negative.   Respiratory: Negative.  Negative for shortness of breath.   Cardiovascular:  Negative.  Negative for chest pain and leg swelling.  Gastrointestinal: Negative.   Genitourinary: Negative.   Musculoskeletal: Negative.   Skin: Negative.   Neurological: Negative.   Psychiatric/Behavioral: Negative.   All other systems reviewed and are negative.      Objective:   Physical Exam  Constitutional: He is oriented to person, place, and time. He appears well-developed and well-nourished.  Cardiovascular: Normal rate, regular rhythm, normal heart sounds and intact distal pulses.   Pulmonary/Chest: Effort normal and breath sounds normal.  Neurological: He is alert and oriented to person, place, and time.  Psychiatric: He has a normal mood and affect. His behavior is normal. Thought content normal.   BP 125/68 mmHg  Pulse 67  Temp(Src) 97 F (36.1 C) (Oral)  Ht 5\' 11"  (1.803 m)  Wt 215 lb (97.523 kg)  BMI 30.00 kg/m2        Assessment & Plan:  1. Essential hypertension Sugars well controlled on lisinopril. Recommend again to decreased from 40-20 mg and follow  2. Atherosclerosis of native coronary artery of native heart without angina pectoris Asymptomatic;followed annually by cardiology  3. Hyperlipidemia Lipids were at goal 3 months ago with LDL at 60 and HDL of 50  Wardell Honour MD

## 2016-03-05 ENCOUNTER — Other Ambulatory Visit: Payer: Self-pay | Admitting: Cardiology

## 2016-03-20 DIAGNOSIS — G4733 Obstructive sleep apnea (adult) (pediatric): Secondary | ICD-10-CM | POA: Diagnosis not present

## 2016-04-13 ENCOUNTER — Other Ambulatory Visit: Payer: Self-pay | Admitting: Cardiology

## 2016-04-16 ENCOUNTER — Other Ambulatory Visit: Payer: Self-pay | Admitting: Cardiology

## 2016-04-16 MED ORDER — CLOPIDOGREL BISULFATE 75 MG PO TABS: 75.0000 mg | ORAL_TABLET | Freq: Every day | ORAL | 0 refills | Status: DC

## 2016-05-02 ENCOUNTER — Other Ambulatory Visit: Payer: Self-pay | Admitting: *Deleted

## 2016-05-02 MED ORDER — CLOPIDOGREL BISULFATE 75 MG PO TABS: 75.0000 mg | ORAL_TABLET | Freq: Every day | ORAL | 11 refills | Status: DC

## 2016-05-15 ENCOUNTER — Other Ambulatory Visit: Payer: Self-pay | Admitting: Cardiology

## 2016-05-25 ENCOUNTER — Ambulatory Visit (INDEPENDENT_AMBULATORY_CARE_PROVIDER_SITE_OTHER): Payer: Medicare Other | Admitting: *Deleted

## 2016-05-25 VITALS — BP 160/73 | HR 79 | Ht 71.0 in | Wt 223.0 lb

## 2016-05-25 DIAGNOSIS — Z0000 Encounter for general adult medical examination without abnormal findings: Secondary | ICD-10-CM

## 2016-05-25 NOTE — Patient Instructions (Signed)
  [name] ,  Thank you for taking time to come for your Medicare Wellness Visit. I appreciate your ongoing commitment to your health goals. Please review the following plan we discussed and let me know if I can assist you in the future.   These are the goals we discussed: Goals    . Exercise 3x per week (30 min per time)          Continue to exercise 3 times per week       This is a list of the screening recommended for you and due dates:  Health Maintenance  Topic Date Due  . Colon Cancer Screening  03/23/2020  . Tetanus Vaccine  01/11/2021  . Flu Shot  Completed  . Shingles Vaccine  Completed  . Pneumonia vaccines  Completed

## 2016-05-28 NOTE — Progress Notes (Signed)
Subjective:   [name] is a 75 y.o. male who presents for a Subsequent Medicare Annual Wellness Visit. [name] lives at home with his wife. He has one son and 2 stepchildren. He stays active around his home and enjoys fishing. 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   Review of Systems   Patient reports that his overall health is about the same as last year.  He does complain of some weakness and heaviness in his legs at times. They are also a little achy. This has been going on for about 6 months. He denies any lower extremity swelling and no shortness of breath. He is on a statin and questions if this could be the cause of discomfort.   Other systems negative.    Objective:    Today's Vitals   05/25/16 1600  BP: (!) 160/73  Pulse: 79  Weight: 223 lb (101.2 kg)  Height: 5\' 11"  (1.803 m)   Body mass index is 31.1 kg/m.  Current Medications (verified) Outpatient Encounter Prescriptions as of 05/25/2016  Medication Sig  . aspirin 81 MG tablet Take 81 mg by mouth daily.    Marland Kitchen atorvastatin (LIPITOR) 40 MG tablet Take 1 tablet (40 mg total) by mouth daily.  . clopidogrel (PLAVIX) 75 MG tablet Take 1 tablet (75 mg total) by mouth daily.  Marland Kitchen lisinopril (PRINIVIL,ZESTRIL) 40 MG tablet Take 1 tablet (40 mg total) by mouth daily. **Please keep 07/18/16 appointment for further refills**  . Multiple Vitamins-Minerals (CENTRUM SILVER PO) Take 1 capsule by mouth daily.    . nitroGLYCERIN (NITROSTAT) 0.4 MG SL tablet DISSOLVE 1 TABLET UNDER TONGUE AS NEEDED FOR CHEST PAIN   No facility-administered encounter medications on file as of 05/25/2016.     Allergies (verified) Review of patient's allergies indicates no known allergies.   History: Past Medical History:  Diagnosis Date  . 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 Diverticulosis   . Hepatomegaly   . Hx of colonic polyps   . Obesity, unspecified   . Obstructive  sleep apnea (adult) (pediatric)   . Other and unspecified hyperlipidemia   . Unspecified essential hypertension    Past Surgical History:  Procedure Laterality Date  . COLONOSCOPY  9/17/20012  . CORONARY STENT PLACEMENT  08/2006   LAD  . POLYPECTOMY  09/17/20012  . TONSILLECTOMY     Family History  Problem Relation Age of Onset  . Heart disease Father     and Multiple family members on father Side   . Cancer Mother   . Hypertension Mother   . Hypertension Sister   . Irritable bowel syndrome Maternal Grandmother   . Colon cancer Neg Hx    Social History   Occupational History  . Nurse, learning disability     Retired   . 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 Smoking status: Former Smoker    Packs/day: 1.00    Types: Cigarettes    Quit date: 03/06/1971  . Smokeless tobacco: Former Systems developer    Types: Chew    Quit date: 07/30/1989  . Alcohol use No     Comment: 1 drink daily   . Drug use: No  . Sexual activity: No   Tobacco Counseling No tobacco use  Activities of Daily Living In your present state of health, do you have any difficulty performing the following activities: 05/25/2016  Hearing? N  Vision? N  Difficulty concentrating or making decisions? N  Walking or climbing stairs? N  Dressing or bathing? N  Doing errands, shopping? N  Preparing Food and eating ? N  Using the Toilet? N  In the past six months, have you accidently leaked urine? N  Do you have problems with loss of bowel control? N  Managing your Medications? N  Managing your Finances? N  Housekeeping or managing your Housekeeping? N  Some recent data might be hidden    Immunizations and Health Maintenance Immunization History  Administered Date(s) Administered  . Influenza Split 04/29/2013  . Influenza,inj,Quad PF,36+ Mos 05/02/2015  . Influenza-Unspecified 05/26/2014, 05/15/2016  . Pneumococcal Conjugate-13 06/14/2014  . Pneumococcal Polysaccharide-23 06/09/2010  .  Tdap 01/12/2011  . Zoster 10/29/2014   There are no preventive care reminders to display for this patient.  Patient Care Team: Wardell Honour, MD as PCP - General (Family Medicine) Larey Dresser, MD as Consulting Physician (Cardiology) Irene Shipper, MD as Consulting Physician (Gastroenterology) Optometry is with the [place].     Assessment:   This is a routine wellness examination for [name].  Hearing/Vision screen No hearing or vision deficits noted. Patient has no complaints.   Dietary issues and exercise activities discussed: Current Exercise Habits: Structured exercise class, Type of exercise: Other - see comments;walking (silver sneakers, 30 min on treadmill 3 times a week), Time (Minutes): 60, Frequency (Times/Week): 7, Weekly Exercise (Minutes/Week): 420, Intensity: Moderate, Exercise limited by: None identified  He eats a good breakfast and a good lunch or dinner.   Goals    . Exercise 3x per week (30 min per time)          Continue to exercise 3 times per week      Depression Screen PHQ 2/9 Scores 05/25/2016 01/05/2016 06/28/2015 12/21/2014  PHQ - 2 Score 0 0 0 0    Fall Risk Fall Risk  05/25/2016 01/05/2016 06/28/2015 12/21/2014 07/21/2014  Falls in the past year? No No No No No    Cognitive Function: MMSE - Mini Mental State Exam 05/25/2016 12/21/2014  Orientation to time 5 5  Orientation to Place 5 5  Registration 3 3  Attention/ Calculation 5 5  Recall 3 3  Language- name 2 objects 2 2  Language- repeat 1 1  Language- follow 3 step command 3 3  Language- read & follow direction 1 1  Write a sentence 1 1  Copy design 1 1  Total score 30 30    Normal exam.    Screening Tests Health Maintenance  Topic Date Due  . COLONOSCOPY  03/23/2020  . TETANUS/TDAP  01/11/2021  . INFLUENZA VACCINE  Completed  . ZOSTAVAX  Completed  . PNA vac Low Risk Adult  Completed        Plan:  Keep physical appointment with Dr Sabra Heck on 07/10/16. Follow up with Dr Sabra Heck  sooner if problems with legs worsen or if any new symptoms develop.  During the course of the visit [name] was educated and counseled about the following appropriate screening and preventive services:   Vaccines to include Pneumoccal-up to date, Influenza-up to date,Tdap-up to date, Zostavax-up to date  Colorectal cancer screening-done 03/24/15  Cardiovascular disease screening-patient of Dr Claris Gladden  Diabetes screening-with routine labs  Gl[facility]  Nutrition counseling  Prostate cancer screening-Due  Exercise-continue to stay active and exercise 3 times per week  Patient Instructions (the written plan) were given to the patient.  Chong Sicilian, RN   05/28/2016   I have reviewed and agree with the above AWV documentation.   Wardell Honour MD

## 2016-07-04 ENCOUNTER — Other Ambulatory Visit: Payer: Self-pay | Admitting: Cardiology

## 2016-07-06 ENCOUNTER — Ambulatory Visit: Payer: Medicare Other | Admitting: Family Medicine

## 2016-07-09 NOTE — Progress Notes (Signed)
   Subjective:    Patient ID: Varian G Kise, male    DOB: 02/16/1941, 75 y.o.   MRN: [id number]  HPI 75-year-old gentleman here for an annual exam. His only complaints is myalgias and arthralgias that he thinks might be related to use of statins. He takes atorvastatin every other day. He has not seen any difference in symptoms since going to the every other day regimen. He has had no chest pain. He carries nitroglycerin in his pocket but has not taken any in 10 years. There are no other complaints  Patient Active Problem List   Diagnosis Date Noted  . ETD (eustachian tube dysfunction) 07/07/2013  . Fatty liver disease, nonalcoholic 04/22/2013  . Internal hemorrhoids 03/18/2013  . Abnormal transaminases 11/25/2012  . Metabolic syndrome 11/11/2012  . Acute hemorrhoid 11/11/2012  . Murmur 10/24/2011  . EDEMA 03/07/2009  . HEMATOMA 01/05/2009  . UNSPECIFIED CHRONIC ISCHEMIC HEART DISEASE 12/22/2008  . Hyperlipidemia 12/21/2008  . OBESITY 12/21/2008  . SLEEP APNEA, OBSTRUCTIVE 12/21/2008  . Essential hypertension 12/21/2008  . Coronary atherosclerosis 12/21/2008   Outpatient Encounter Prescriptions as of 07/10/2016  Medication Sig  . aspirin 81 MG tablet Take 81 mg by mouth daily.    . atorvastatin (LIPITOR) 40 MG tablet TAKE 1 TABLET DAILY (Patient taking differently: TAKE 1 TABLET EVERY OTHER DAY)  . clopidogrel (PLAVIX) 75 MG tablet Take 1 tablet (75 mg total) by mouth daily.  . lisinopril (PRINIVIL,ZESTRIL) 40 MG tablet Take 1 tablet (40 mg total) by mouth daily. **Please keep 07/18/16 appointment for further refills**  . Multiple Vitamins-Minerals (CENTRUM SILVER PO) Take 1 capsule by mouth daily.    . nitroGLYCERIN (NITROSTAT) 0.4 MG SL tablet DISSOLVE 1 TABLET UNDER TONGUE AS NEEDED FOR CHEST PAIN   No facility-administered encounter medications on file as of 07/10/2016.       Review of Systems  Constitutional: Negative.   HENT: Negative.   Respiratory: Negative.     Cardiovascular: Negative.   Musculoskeletal: Positive for arthralgias and myalgias.  Psychiatric/Behavioral: Negative.        Objective:   Physical Exam  Constitutional: He is oriented to person, place, and time. He appears well-developed and well-nourished.  Eyes: Pupils are equal, round, and reactive to light.  Cardiovascular: Normal rate, regular rhythm and intact distal pulses.   Murmur heard. Pulmonary/Chest: Effort normal and breath sounds normal.  Abdominal: Soft. There is no tenderness.  Musculoskeletal: Normal range of motion.  Neurological: He is alert and oriented to person, place, and time.  Psychiatric: He has a normal mood and affect. His behavior is normal.   BP 128/62   Pulse 68   Temp 97.4 F (36.3 C) (Oral)   Ht 5' 11" (1.803 m)   Wt 222 lb 9.6 oz (101 kg)   BMI 31.05 kg/m        Assessment & Plan:  1. Atherosclerosis of native coronary artery of native heart without angina pectoris No chest pain. Problem is stable.  2. Mixed hyperlipidemia We'll check cholesterol today and then hold statin to see if symptoms are improved. Given his history of heart disease I would still like to keep LDL less than 100 - CMP14+EGFR - Lipid panel - Sedimentation rate  3. Annual physical exam There are no focal findings on exam. Will check PSA since it has been 2 years. There is no family history of prostate cancer. - PSA, total and free  Stephen M Miller MD  

## 2016-07-10 ENCOUNTER — Ambulatory Visit (INDEPENDENT_AMBULATORY_CARE_PROVIDER_SITE_OTHER): Payer: Medicare Other | Admitting: Family Medicine

## 2016-07-10 ENCOUNTER — Encounter: Payer: Self-pay | Admitting: Family Medicine

## 2016-07-10 VITALS — BP 128/62 | HR 68 | Temp 97.4°F | Ht 71.0 in | Wt 222.6 lb

## 2016-07-10 DIAGNOSIS — E782 Mixed hyperlipidemia: Secondary | ICD-10-CM | POA: Diagnosis not present

## 2016-07-10 DIAGNOSIS — I2510 Atherosclerotic heart disease of native coronary artery without angina pectoris: Secondary | ICD-10-CM

## 2016-07-10 DIAGNOSIS — Z0000 Encounter for general adult medical examination without abnormal findings: Secondary | ICD-10-CM

## 2016-07-11 LAB — SEDIMENTATION RATE: Sed Rate: 2 mm/hr (ref 0–30)

## 2016-07-11 LAB — PSA, TOTAL AND FREE
PSA FREE PCT: 24.4 %
PSA, Free: 0.44 ng/mL
Prostate Specific Ag, Serum: 1.8 ng/mL (ref 0.0–4.0)

## 2016-07-11 LAB — CMP14+EGFR
ALK PHOS: 98 IU/L (ref 39–117)
ALT: 45 IU/L — ABNORMAL HIGH (ref 0–44)
AST: 59 IU/L — AB (ref 0–40)
Albumin/Globulin Ratio: 1.7 (ref 1.2–2.2)
Albumin: 3.8 g/dL (ref 3.5–4.8)
BUN/Creatinine Ratio: 13 (ref 10–24)
BUN: 12 mg/dL (ref 8–27)
Bilirubin Total: 0.8 mg/dL (ref 0.0–1.2)
CO2: 21 mmol/L (ref 18–29)
CREATININE: 0.93 mg/dL (ref 0.76–1.27)
Calcium: 8.9 mg/dL (ref 8.6–10.2)
Chloride: 102 mmol/L (ref 96–106)
GFR calc Af Amer: 93 mL/min/{1.73_m2} (ref >59)
GFR calc non Af Amer: 81 mL/min/{1.73_m2} (ref >59)
GLUCOSE: 113 mg/dL — AB (ref 65–99)
Globulin, Total: 2.3 g/dL (ref 1.5–4.5)
Potassium: 3.9 mmol/L (ref 3.5–5.2)
Sodium: 141 mmol/L (ref 134–144)
Total Protein: 6.1 g/dL (ref 6.0–8.5)

## 2016-07-11 LAB — LIPID PANEL
CHOLESTEROL TOTAL: 155 mg/dL (ref 100–199)
Chol/HDL Ratio: 3 ratio units (ref 0.0–5.0)
HDL: 52 mg/dL (ref >39)
LDL CALC: 74 mg/dL (ref 0–99)
TRIGLYCERIDES: 147 mg/dL (ref 0–149)
VLDL CHOLESTEROL CAL: 29 mg/dL (ref 5–40)

## 2016-07-13 ENCOUNTER — Ambulatory Visit: Payer: Medicare Other | Admitting: Cardiology

## 2016-07-18 ENCOUNTER — Ambulatory Visit (INDEPENDENT_AMBULATORY_CARE_PROVIDER_SITE_OTHER): Payer: Medicare Other | Admitting: Cardiology

## 2016-07-18 ENCOUNTER — Encounter: Payer: Self-pay | Admitting: Cardiology

## 2016-07-18 VITALS — BP 137/67 | HR 82 | Ht 71.0 in | Wt 224.0 lb

## 2016-07-18 DIAGNOSIS — E782 Mixed hyperlipidemia: Secondary | ICD-10-CM

## 2016-07-18 DIAGNOSIS — I2510 Atherosclerotic heart disease of native coronary artery without angina pectoris: Secondary | ICD-10-CM

## 2016-07-18 DIAGNOSIS — R011 Cardiac murmur, unspecified: Secondary | ICD-10-CM

## 2016-07-18 MED ORDER — ROSUVASTATIN CALCIUM 10 MG PO TABS: 10.0000 mg | ORAL_TABLET | Freq: Every day | ORAL | 3 refills | Status: DC

## 2016-07-18 NOTE — Patient Instructions (Signed)
Medication Instructions:  Your physician has recommended you make the following change in your medication:  1.  STOP the Atorvastatin 2.  START Crestor 10 mg taking 1 tablet daily 1 week after you stop the Atorvastatin    Labwork: None ordered  Testing/Procedures: None ordered  Follow-Up: Your physician wants you to follow-up in: [place] DR. HOCHREIN   You will receive a reminder letter in the mail two months in advance. If you don't receive a letter, please call our office to schedule the follow-up appointment.   Any Other Special Instructions Will Be Listed Below (If Applicable).     If you need a refill on your cardiac medications before your next appointment, please call your pharmacy.

## 2016-07-20 NOTE — Progress Notes (Signed)
Patient ID: [name], male   DOB: 1941/07/17, 75 y.o.   MRN: [id number] PCP: Dr. Alain Honey  75 yo with history of CAD and HTN presents for cardiology followup.  He had a DES to the LAD in 2/08. Repeat cath in 11/11 showed patent stent.  Echo in 2/16 showed EF 55-60%, no significant valvular abnormalities.    No chest pain.  He works out 3 days a week at [employer].  No exertional dyspnea.  BP is controlled.  He developed leg pain and weakness.  PCP had him stop atorvastatin, symptoms go better off atorvastatin and he wants to stay off it.   Weight is up 3 lbs since last appointment.   Labs (6/12): K 4.7, creatinine 0.7, LDL 74 Labs (12/14); K 4.8, creatinine 0.88, LDL-P 1377, LDL 74, AST 55, ALT 41 Labs (4/15): K 5, creatinine 0.95 Labs (12/15): LDL 87, HDL 48, AST 70, ALT 57 Labs (12/17): LDL 74, HDL 52, K 3.9, creatinine 0.93  ECG: NSR, inferior Qs  PMH: 1. HTN 2. Hyperlipidemia: Myalgias with atorvastatin.  3. OSA: CPAP 4. CAD: Cypher DES in LAD in 2/08.  LHC in 11/11 with patent LAD stent, EF 55-60%. Echo (4/13) with EF 55-60%, no aortic stenosis.  - Echo (2/16): EF 55-60%.  5. Colonic polyps 6.  Mild transaminase elevation, ?NAFLD.  Viral hepatitis workup in 9/14 was negative, ANA negative in 9/14.   SH: Married, lives in [place].  2 children.  Retired.  Nonsmoker.    FH: CAD  ROS: All systems reviewed and negative except as per HPI.   Current Outpatient Prescriptions  Medication Sig Dispense Refill  . aspirin 81 MG tablet Take 81 mg by mouth daily.      . clopidogrel (PLAVIX) 75 MG tablet Take 1 tablet (75 mg total) by mouth daily. 30 tablet 11  . lisinopril (PRINIVIL,ZESTRIL) 40 MG tablet Take 1 tablet (40 mg total) by mouth daily. **Please keep 07/18/16 appointment for further refills** 90 tablet 0  . Multiple Vitamins-Minerals (CENTRUM SILVER PO) Take 1 capsule by mouth daily.      . nitroGLYCERIN (NITROSTAT) 0.4 MG SL tablet DISSOLVE 1 TABLET UNDER TONGUE AS  NEEDED FOR CHEST PAIN 25 tablet PRN  . rosuvastatin (CRESTOR) 10 MG tablet Take 1 tablet (10 mg total) by mouth daily. 90 tablet 3   No current facility-administered medications for this visit.     BP 137/67   Pulse 82   Ht 5\' 11"  (1.803 m)   Wt 224 lb (101.6 kg)   BMI 31.24 kg/m  General: NAD Neck: No JVD, no thyromegaly or thyroid nodule.  Lungs: Clear to auscultation bilaterally with normal respiratory effort. CV: Nondisplaced PMI.  Heart regular S1/S2, no S3/S4, 1/6 early SEM RUSB.  1+ ankle edema bilaterally.  No carotid bruit.  Normal pedal pulses.  Abdomen: Soft, nontender, no hepatosplenomegaly, no distention.  Neurologic: Alert and oriented x 3.  Psych: Normal affect. Extremities: No clubbing or cyanosis.   Assessment/Plan: 1. CAD: Stable with no ischemic symptoms.  Continue ASA 81, Plavix (1st generation DES), lisinopril. I would like to get him back on a statin. 2. Murmur: Echo in 2/16 with no significant valvular disease.    3. Hyperlipidemia: Possible side effects from atorvastatin, now off. Would wait 10-14 more days then try him on Crestor 10 mg daily with lipids/LFTs in 2 months.  Given my transition to CHF clinic, he can followup in 1 year with Dr. Percival Spanish in [place]  Loralie Champagne 07/20/2016

## 2016-08-27 DIAGNOSIS — G4733 Obstructive sleep apnea (adult) (pediatric): Secondary | ICD-10-CM | POA: Diagnosis not present

## 2016-09-07 ENCOUNTER — Other Ambulatory Visit: Payer: Self-pay | Admitting: *Deleted

## 2016-09-07 MED ORDER — LISINOPRIL 40 MG PO TABS: 40.0000 mg | ORAL_TABLET | Freq: Every day | ORAL | 2 refills | Status: DC

## 2016-12-20 DIAGNOSIS — M79671 Pain in right foot: Secondary | ICD-10-CM | POA: Diagnosis not present

## 2016-12-20 DIAGNOSIS — M7731 Calcaneal spur, right foot: Secondary | ICD-10-CM | POA: Diagnosis not present

## 2016-12-20 DIAGNOSIS — R2241 Localized swelling, mass and lump, right lower limb: Secondary | ICD-10-CM | POA: Diagnosis not present

## 2016-12-27 DIAGNOSIS — M899 Disorder of bone, unspecified: Secondary | ICD-10-CM | POA: Diagnosis not present

## 2016-12-27 DIAGNOSIS — R2241 Localized swelling, mass and lump, right lower limb: Secondary | ICD-10-CM | POA: Diagnosis not present

## 2017-01-03 DIAGNOSIS — M7731 Calcaneal spur, right foot: Secondary | ICD-10-CM | POA: Diagnosis not present

## 2017-01-03 DIAGNOSIS — M79671 Pain in right foot: Secondary | ICD-10-CM | POA: Diagnosis not present

## 2017-01-03 DIAGNOSIS — M67471 Ganglion, right ankle and foot: Secondary | ICD-10-CM | POA: Diagnosis not present

## 2017-01-08 ENCOUNTER — Encounter: Payer: Self-pay | Admitting: Physician Assistant

## 2017-01-08 ENCOUNTER — Ambulatory Visit (INDEPENDENT_AMBULATORY_CARE_PROVIDER_SITE_OTHER): Payer: Medicare Other | Admitting: Physician Assistant

## 2017-01-08 VITALS — BP 126/63 | HR 61 | Temp 97.0°F | Ht 71.0 in | Wt 234.0 lb

## 2017-01-08 DIAGNOSIS — M67472 Ganglion, left ankle and foot: Secondary | ICD-10-CM | POA: Diagnosis not present

## 2017-01-08 DIAGNOSIS — I259 Chronic ischemic heart disease, unspecified: Secondary | ICD-10-CM | POA: Diagnosis not present

## 2017-01-08 DIAGNOSIS — I10 Essential (primary) hypertension: Secondary | ICD-10-CM | POA: Diagnosis not present

## 2017-01-08 DIAGNOSIS — E782 Mixed hyperlipidemia: Secondary | ICD-10-CM

## 2017-01-08 NOTE — Patient Instructions (Signed)

## 2017-01-09 ENCOUNTER — Other Ambulatory Visit: Payer: Medicare Other

## 2017-01-09 DIAGNOSIS — Z1212 Encounter for screening for malignant neoplasm of rectum: Secondary | ICD-10-CM

## 2017-01-09 LAB — CMP14+EGFR
ALT: 41 IU/L (ref 0–44)
AST: 65 IU/L — AB (ref 0–40)
Albumin/Globulin Ratio: 1.4 (ref 1.2–2.2)
Albumin: 3.7 g/dL (ref 3.5–4.8)
Alkaline Phosphatase: 105 IU/L (ref 39–117)
BUN/Creatinine Ratio: 8 — ABNORMAL LOW (ref 10–24)
BUN: 8 mg/dL (ref 8–27)
Bilirubin Total: 0.6 mg/dL (ref 0.0–1.2)
CALCIUM: 9 mg/dL (ref 8.6–10.2)
CO2: 23 mmol/L (ref 20–29)
Chloride: 101 mmol/L (ref 96–106)
Creatinine, Ser: 0.98 mg/dL (ref 0.76–1.27)
GFR, EST AFRICAN AMERICAN: 87 mL/min/{1.73_m2} (ref >59)
GFR, EST NON AFRICAN AMERICAN: 75 mL/min/{1.73_m2} (ref >59)
GLOBULIN, TOTAL: 2.7 g/dL (ref 1.5–4.5)
Glucose: 137 mg/dL — ABNORMAL HIGH (ref 65–99)
Potassium: 4 mmol/L (ref 3.5–5.2)
SODIUM: 139 mmol/L (ref 134–144)
TOTAL PROTEIN: 6.4 g/dL (ref 6.0–8.5)

## 2017-01-09 LAB — LIPID PANEL
CHOL/HDL RATIO: 3.4 ratio (ref 0.0–5.0)
Cholesterol, Total: 134 mg/dL (ref 100–199)
HDL: 40 mg/dL (ref >39)
LDL Calculated: 74 mg/dL (ref 0–99)
TRIGLYCERIDES: 100 mg/dL (ref 0–149)
VLDL Cholesterol Cal: 20 mg/dL (ref 5–40)

## 2017-01-09 NOTE — Progress Notes (Signed)
BP 126/63   Pulse 61   Temp 97 F (36.1 C) (Oral)   Ht '5\' 11"'$  (1.803 m)   Wt 234 lb (106.1 kg)   BMI 32.64 kg/m    Subjective:    Patient ID: [name], male    DOB: [birth year], 76 y.o.   MRN: [id number]  HPI: [name] is a 76 y.o. male presenting on 01/08/2017 for Follow up chronic medical problems  This patient comes in for periodic recheck on medications and conditions including hypertension, heart disease, hyperlipidemia, ganglion cyst. This is been a patient of Dr. Ammie Ferrier in the past. He is transferring over to me today. There are no new complaints at this time. The cyst is going to be excised by Dr. Irving Shows later this summer. He has had it for many years but is just been in the past couple years where it has begun to have problems with certain shoes that he wears. It does not hurt if is not touched..   All medications are reviewed today. There are no reports of any problems with the medications. All of the medical conditions are reviewed and updated.  Lab work is reviewed and will be ordered as medically necessary. There are no new problems reported with today's visit.   Relevant past medical, surgical, family and social history reviewed and updated as indicated. Allergies and medications reviewed and updated.  Past Medical History:  Diagnosis Date  . 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 Diverticulosis   . Hepatomegaly   . Hx of colonic polyps   . Obesity, unspecified   . Obstructive sleep apnea (adult) (pediatric)   . Other and unspecified hyperlipidemia   . Unspecified essential hypertension     Past Surgical History:  Procedure Laterality Date  . COLONOSCOPY  9/17/20012  . CORONARY STENT PLACEMENT  08/2006   LAD  . POLYPECTOMY  09/17/20012  . TONSILLECTOMY      Review of Systems  Constitutional: Negative.  Negative for appetite change and fatigue.  HENT: Negative.   Eyes: Negative.  Negative for pain and visual  disturbance.  Respiratory: Negative.  Negative for cough, chest tightness, shortness of breath and wheezing.   Cardiovascular: Negative.  Negative for chest pain, palpitations and leg swelling.  Gastrointestinal: Negative.  Negative for abdominal pain, diarrhea, nausea and vomiting.  Endocrine: Negative.   Genitourinary: Negative.   Musculoskeletal: Positive for gait problem and joint swelling.  Skin: Negative.  Negative for color change and rash.  Neurological: Negative for weakness, numbness and headaches.  Psychiatric/Behavioral: Negative.     Allergies as of 01/08/2017   No Known Allergies     Medication List       Accurate as of 01/08/17 11:59 PM. Always use your most recent med list.          aspirin 81 MG tablet Take 81 mg by mouth daily.   atorvastatin 20 MG tablet Commonly known as:  LIPITOR Take 20 mg by mouth daily.   CENTRUM SILVER PO Take 1 capsule by mouth daily.   clopidogrel 75 MG tablet Commonly known as:  PLAVIX Take 1 tablet (75 mg total) by mouth daily.   cyanocobalamin 1000 MCG tablet Take 1,000 mcg by mouth daily.   glucosamine-chondroitin 500-400 MG tablet Take 1 tablet by mouth 2 (two) times daily.   lisinopril 40 MG tablet Commonly known as:  PRINIVIL,ZESTRIL Take 1 tablet (40 mg total) by mouth daily.   nitroGLYCERIN 0.4  MG SL tablet Commonly known as:  NITROSTAT DISSOLVE 1 TABLET UNDER TONGUE AS NEEDED FOR CHEST PAIN   psyllium 58.6 % packet Commonly known as:  METAMUCIL Take 1 packet by mouth daily.          Objective:    BP 126/63   Pulse 61   Temp 97 F (36.1 C) (Oral)   Ht '5\' 11"'$  (1.803 m)   Wt 234 lb (106.1 kg)   BMI 32.64 kg/m   No Known Allergies  Physical Exam  Constitutional: He appears well-developed and well-nourished.  HENT:  Head: Normocephalic and atraumatic.  Eyes: Conjunctivae and EOM are normal. Pupils are equal, round, and reactive to light.  Neck: Normal range of motion. Neck supple.    Cardiovascular: Normal rate, regular rhythm and normal heart sounds.   Pulmonary/Chest: Effort normal and breath sounds normal.  Abdominal: Soft. Bowel sounds are normal.  Musculoskeletal: Normal range of motion.  Skin: Skin is warm and dry.    Results for orders placed or performed in visit on 01/08/17  CMP14+EGFR  Result Value Ref Range   Glucose 137 (H) 65 - 99 mg/dL   BUN 8 8 - 27 mg/dL   Creatinine, Ser 0.98 0.76 - 1.27 mg/dL   GFR calc non Af Amer 75 >59 mL/min/1.73   GFR calc Af Amer 87 >59 mL/min/1.73   BUN/Creatinine Ratio 8 (L) 10 - 24   Sodium 139 134 - 144 mmol/L   Potassium 4.0 3.5 - 5.2 mmol/L   Chloride 101 96 - 106 mmol/L   CO2 23 20 - 29 mmol/L   Calcium 9.0 8.6 - 10.2 mg/dL   Total Protein 6.4 6.0 - 8.5 g/dL   Albumin 3.7 3.5 - 4.8 g/dL   Globulin, Total 2.7 1.5 - 4.5 g/dL   Albumin/Globulin Ratio 1.4 1.2 - 2.2   Bilirubin Total 0.6 0.0 - 1.2 mg/dL   Alkaline Phosphatase 105 39 - 117 IU/L   AST 65 (H) 0 - 40 IU/L   ALT 41 0 - 44 IU/L  Lipid panel  Result Value Ref Range   Cholesterol, Total 134 100 - 199 mg/dL   Triglycerides 100 0 - 149 mg/dL   HDL 40 >39 mg/dL   VLDL Cholesterol Cal 20 5 - 40 mg/dL   LDL Calculated 74 0 - 99 mg/dL   Chol/HDL Ratio 3.4 0.0 - 5.0 ratio      Assessment & Plan:   1. UNSPECIFIED CHRONIC ISCHEMIC HEART DISEASE - CMP14+EGFR - Lipid panel  2. Mixed hyperlipidemia - atorvastatin (LIPITOR) 20 MG tablet; Take 20 mg by mouth daily. - CMP14+EGFR - Lipid panel  3. Essential hypertension  4. Ganglion cyst of left foot Dr. Irving Shows will be performing surgery later this summer. MRI showed positive ganglion cyst of the left heel.   Current Outpatient Prescriptions:  .  aspirin 81 MG tablet, Take 81 mg by mouth daily.  , Disp: , Rfl:  .  atorvastatin (LIPITOR) 20 MG tablet, Take 20 mg by mouth daily., Disp: , Rfl:  .  clopidogrel (PLAVIX) 75 MG tablet, Take 1 tablet (75 mg total) by mouth daily., Disp: 30 tablet, Rfl: 11 .   cyanocobalamin 1000 MCG tablet, Take 1,000 mcg by mouth daily., Disp: , Rfl:  .  glucosamine-chondroitin 500-400 MG tablet, Take 1 tablet by mouth 2 (two) times daily., Disp: , Rfl:  .  lisinopril (PRINIVIL,ZESTRIL) 40 MG tablet, Take 1 tablet (40 mg total) by mouth daily., Disp: 90 tablet, Rfl: 2 .  Multiple Vitamins-Minerals (CENTRUM SILVER PO), Take 1 capsule by mouth daily.  , Disp: , Rfl:  .  nitroGLYCERIN (NITROSTAT) 0.4 MG SL tablet, DISSOLVE 1 TABLET UNDER TONGUE AS NEEDED FOR CHEST PAIN, Disp: 25 tablet, Rfl: PRN .  psyllium (METAMUCIL) 58.6 % packet, Take 1 packet by mouth daily., Disp: , Rfl:   Continue all other maintenance medications as listed above.  Follow up plan: Return in about 6 months (around 07/10/2017) for recheck.  Educational handout given for hyperlipidemia  [name] PA-C [facility] [address]  [place], [place] [address] [phone]   01/09/2017, 8:52 AM

## 2017-01-10 LAB — FECAL OCCULT BLOOD, IMMUNOCHEMICAL: Fecal Occult Bld: NEGATIVE

## 2017-01-16 ENCOUNTER — Other Ambulatory Visit: Payer: Self-pay | Admitting: Cardiology

## 2017-01-17 NOTE — Telephone Encounter (Signed)
Followed by Dr. Percival Spanish in [place]

## 2017-02-05 DIAGNOSIS — M79671 Pain in right foot: Secondary | ICD-10-CM | POA: Diagnosis not present

## 2017-02-05 DIAGNOSIS — M7731 Calcaneal spur, right foot: Secondary | ICD-10-CM | POA: Diagnosis not present

## 2017-02-22 DIAGNOSIS — Z955 Presence of coronary angioplasty implant and graft: Secondary | ICD-10-CM | POA: Diagnosis not present

## 2017-02-22 DIAGNOSIS — I2510 Atherosclerotic heart disease of native coronary artery without angina pectoris: Secondary | ICD-10-CM | POA: Diagnosis not present

## 2017-02-22 DIAGNOSIS — E785 Hyperlipidemia, unspecified: Secondary | ICD-10-CM | POA: Diagnosis not present

## 2017-02-22 DIAGNOSIS — M7731 Calcaneal spur, right foot: Secondary | ICD-10-CM | POA: Diagnosis not present

## 2017-02-22 DIAGNOSIS — I10 Essential (primary) hypertension: Secondary | ICD-10-CM | POA: Diagnosis not present

## 2017-02-22 DIAGNOSIS — Z79899 Other long term (current) drug therapy: Secondary | ICD-10-CM | POA: Diagnosis not present

## 2017-02-22 DIAGNOSIS — R2241 Localized swelling, mass and lump, right lower limb: Secondary | ICD-10-CM | POA: Diagnosis not present

## 2017-02-22 DIAGNOSIS — G4730 Sleep apnea, unspecified: Secondary | ICD-10-CM | POA: Diagnosis not present

## 2017-02-22 DIAGNOSIS — Z7902 Long term (current) use of antithrombotics/antiplatelets: Secondary | ICD-10-CM | POA: Diagnosis not present

## 2017-02-22 DIAGNOSIS — M89371 Hypertrophy of bone, right ankle and foot: Secondary | ICD-10-CM | POA: Diagnosis not present

## 2017-02-22 DIAGNOSIS — G629 Polyneuropathy, unspecified: Secondary | ICD-10-CM | POA: Diagnosis not present

## 2017-03-05 DIAGNOSIS — M79671 Pain in right foot: Secondary | ICD-10-CM | POA: Diagnosis not present

## 2017-03-05 DIAGNOSIS — M7731 Calcaneal spur, right foot: Secondary | ICD-10-CM | POA: Diagnosis not present

## 2017-03-11 ENCOUNTER — Encounter: Payer: Self-pay | Admitting: Cardiology

## 2017-03-20 DIAGNOSIS — G4733 Obstructive sleep apnea (adult) (pediatric): Secondary | ICD-10-CM | POA: Diagnosis not present

## 2017-03-26 NOTE — Progress Notes (Signed)
Cardiology Office Note   Date:  03/27/2017   ID:  [name], DOB 1940-10-19, MRN [id number]  PCP:  Wardell Honour, MD  Cardiologist:   Minus Breeding, MD  Referring:  Particia Nearing  Chief Complaint  Patient presents with  . Coronary Artery Disease      History of Present Illness: [name] is a 76 y.o. male who presents for follow up of CAD. He had a DES to the LAD in 2/08. Repeat cath in 11/11 showed patent stent.  Echo in 2/16 showed EF 55-60%, no significant valvular abnormalities.   The patient was previously seen by Dr. Aundra Dubin but is moving his care to [facility].  He has been somewhat inactive recently because of a heal spur surgery.  He has been doing upper workouts at the [facility]  The patient denies any new symptoms such as chest discomfort, neck or arm discomfort. There has been no new shortness of breath, PND or orthopnea. There have been no reported palpitations, presyncope or syncope.   He gets none of the symptoms that were his previous angina.      Past Medical History:  Diagnosis Date  . 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 Diverticulosis   . Hepatomegaly   . Hx of colonic polyps   . Obesity, unspecified   . Obstructive sleep apnea    Uses CPAP.   Marland Kitchen Other and unspecified hyperlipidemia   . Unspecified essential hypertension     Past Surgical History:  Procedure Laterality Date  . COLONOSCOPY  9/17/20012  . CORONARY STENT PLACEMENT  08/2006   LAD  . POLYPECTOMY  09/17/20012  . TONSILLECTOMY       Current Outpatient Prescriptions  Medication Sig Dispense Refill  . aspirin 81 MG tablet Take 81 mg by mouth daily.      Marland Kitchen atorvastatin (LIPITOR) 40 MG tablet Take 40 mg by mouth. Take one tablet by mouth two days a week    . cyanocobalamin 1000 MCG tablet Take 1,000 mcg by mouth daily.    Marland Kitchen lisinopril (PRINIVIL,ZESTRIL) 40 MG tablet Take 1 tablet (40 mg total) by mouth daily. 90 tablet 2  . Multiple  Vitamins-Minerals (CENTRUM SILVER PO) Take 1 capsule by mouth daily.      . nitroGLYCERIN (NITROSTAT) 0.4 MG SL tablet DISSOLVE 1 TABLET UNDER TONGUE AS NEEDED FOR CHEST PAIN 25 tablet PRN  . psyllium (METAMUCIL) 58.6 % packet Take 1 packet by mouth daily.     No current facility-administered medications for this visit.     Allergies:   Patient has no known allergies.    ROS:  Please see the history of present illness.   Otherwise, review of systems are positive for none.   All other systems are reviewed and negative.    PHYSICAL EXAM: VS:  BP 122/70   Pulse 94   Ht 5\' 11"  (1.803 m)   Wt 234 lb (106.1 kg)   BMI 32.64 kg/m  , BMI Body mass index is 32.64 kg/m. GENERAL:  Well appearing HEENT:  Pupils equal round and reactive, fundi not visualized, oral mucosa unremarkable NECK:  No jugular venous distention, waveform within normal limits, carotid upstroke brisk and symmetric, no bruits, no thyromegaly LYMPHATICS:  No cervical, inguinal adenopathy LUNGS:  Clear to auscultation bilaterally BACK:  No CVA tenderness CHEST:  Unremarkable HEART:  PMI not displaced or sustained,S1 and S2 within normal limits, no S3, no S4, no clicks, no  rubs, no murmurs ABD:  Flat, positive bowel sounds normal in frequency in pitch, no bruits, no rebound, no guarding, no midline pulsatile mass, no hepatomegaly, no splenomegaly EXT:  2 plus pulses throughout, mild ankle edema, no cyanosis no clubbing SKIN:  No rashes no nodules NEURO:  Cranial nerves II through XII grossly intact, motor grossly intact throughout PSYCH:  Cognitively intact, oriented to person place and time    EKG:  EKG is ordered today. The ekg ordered today demonstrates Sinus rhythm, rate 94, axis within normal limits, intervals within normal limits, no acute ST-T wave changes.   Recent Labs: 01/08/2017: ALT 41; BUN 8; Creatinine, Ser 0.98; Potassium 4.0; Sodium 139    Lipid Panel    Component Value Date/Time   CHOL 134  01/08/2017 0903   CHOL 138 11/06/2012 0945   TRIG 100 01/08/2017 0903   TRIG 108 07/07/2013 0952   TRIG 98 11/06/2012 0945   HDL 40 01/08/2017 0903   HDL 45 07/07/2013 0952   HDL 42 11/06/2012 0945   CHOLHDL 3.4 01/08/2017 0903   CHOLHDL 3 09/09/2014 1451   VLDL 24.6 09/09/2014 1451   LDLCALC 74 01/08/2017 0903   LDLCALC 74 07/07/2013 0952   LDLCALC 76 11/06/2012 0945      Wt Readings from Last 3 Encounters:  03/27/17 234 lb (106.1 kg)  01/08/17 234 lb (106.1 kg)  07/18/16 224 lb (101.6 kg)      Other studies Reviewed: Additional studies/ records that were reviewed today include: Cath report 2008. Marland Kitchen Review of the above records demonstrates:  Please see elsewhere in the note.     ASSESSMENT AND PLAN:    CAD:  The patient has no ongoing symptoms. We discussed secondary risk reduction to include his exercise and diet. I have reviewed his catheterization from 2008 and there were no extenuating circumstances that suggestively needed Plavix for life. I think the risk of bleeding outweighs the benefits and will discontinue the Plavix.  DYSLIPIDEMIA:  His LDL is excellent as above. We talked about diet. No change in therapy is indicated.  OBESITY:  He has gained 10 lbs.  He understands the need for weight loss with diet and exercise.       Current medicines are reviewed at length with the patient today.  The patient does not have concerns regarding medicines.  The following changes have been made:  no change  Labs/ tests ordered today include: None  Orders Placed This Encounter  Procedures  . EKG 12-Lead     Disposition:   FU with me in six months.       Signed, Minus Breeding, MD  03/27/2017 2:43 PM    [place]

## 2017-03-27 ENCOUNTER — Encounter: Payer: Self-pay | Admitting: Cardiology

## 2017-03-27 ENCOUNTER — Ambulatory Visit (INDEPENDENT_AMBULATORY_CARE_PROVIDER_SITE_OTHER): Payer: Medicare Other | Admitting: Cardiology

## 2017-03-27 VITALS — BP 122/70 | HR 94 | Ht 71.0 in | Wt 234.0 lb

## 2017-03-27 DIAGNOSIS — E669 Obesity, unspecified: Secondary | ICD-10-CM | POA: Insufficient documentation

## 2017-03-27 DIAGNOSIS — I10 Essential (primary) hypertension: Secondary | ICD-10-CM

## 2017-03-27 DIAGNOSIS — I2510 Atherosclerotic heart disease of native coronary artery without angina pectoris: Secondary | ICD-10-CM

## 2017-03-27 NOTE — Patient Instructions (Addendum)
Medication Instructions:  You may discontinue your Plavix.  Continue all other medications as listed.  Follow-Up: Follow up in 6 months with Dr. Allena Napoleon will receive a letter in the mail 2 months before you are due.  Please call us when you receive this letter to schedule your follow up appointment.  If you need a refill on your cardiac medications before your next appointment, please call your pharmacy.  Thank you for choosing [facility]!!

## 2017-04-12 ENCOUNTER — Other Ambulatory Visit: Payer: Self-pay | Admitting: Cardiology

## 2017-04-12 ENCOUNTER — Other Ambulatory Visit: Payer: Self-pay

## 2017-04-12 MED ORDER — NITROGLYCERIN 0.4 MG SL SUBL: SUBLINGUAL_TABLET | SUBLINGUAL | 2 refills | Status: DC

## 2017-06-10 ENCOUNTER — Ambulatory Visit: Payer: Medicare Other | Admitting: Internal Medicine

## 2017-06-27 ENCOUNTER — Telehealth: Payer: Self-pay | Admitting: *Deleted

## 2017-06-27 MED ORDER — ATORVASTATIN CALCIUM 40 MG PO TABS: ORAL_TABLET | ORAL | 11 refills | Status: DC

## 2017-06-27 NOTE — Telephone Encounter (Signed)
Two sets of directions on Atorvastatin Please clarify directions and resend new prescriptions to [facility]

## 2017-06-28 NOTE — Telephone Encounter (Signed)
Patient aware.

## 2017-07-01 DIAGNOSIS — H52213 Irregular astigmatism, bilateral: Secondary | ICD-10-CM | POA: Diagnosis not present

## 2017-07-01 DIAGNOSIS — H25093 Other age-related incipient cataract, bilateral: Secondary | ICD-10-CM | POA: Diagnosis not present

## 2017-07-01 DIAGNOSIS — H5203 Hypermetropia, bilateral: Secondary | ICD-10-CM | POA: Diagnosis not present

## 2017-07-01 DIAGNOSIS — S0502XA Injury of conjunctiva and corneal abrasion without foreign body, left eye, initial encounter: Secondary | ICD-10-CM | POA: Diagnosis not present

## 2017-07-02 DIAGNOSIS — G4733 Obstructive sleep apnea (adult) (pediatric): Secondary | ICD-10-CM | POA: Diagnosis not present

## 2017-07-10 ENCOUNTER — Other Ambulatory Visit: Payer: Self-pay | Admitting: *Deleted

## 2017-07-10 ENCOUNTER — Ambulatory Visit: Payer: Medicare Other | Admitting: Physician Assistant

## 2017-07-10 MED ORDER — LISINOPRIL 40 MG PO TABS: 40.0000 mg | ORAL_TABLET | Freq: Every day | ORAL | 0 refills | Status: DC

## 2017-07-18 ENCOUNTER — Encounter: Payer: Self-pay | Admitting: Physician Assistant

## 2017-07-18 ENCOUNTER — Ambulatory Visit: Payer: Medicare Other | Admitting: Physician Assistant

## 2017-07-18 VITALS — BP 112/61 | HR 77 | Temp 96.8°F | Ht 71.0 in | Wt 234.0 lb

## 2017-07-18 DIAGNOSIS — R739 Hyperglycemia, unspecified: Secondary | ICD-10-CM | POA: Diagnosis not present

## 2017-07-18 DIAGNOSIS — K760 Fatty (change of) liver, not elsewhere classified: Secondary | ICD-10-CM

## 2017-07-18 DIAGNOSIS — I10 Essential (primary) hypertension: Secondary | ICD-10-CM

## 2017-07-18 DIAGNOSIS — R29898 Other symptoms and signs involving the musculoskeletal system: Secondary | ICD-10-CM | POA: Diagnosis not present

## 2017-07-18 LAB — BAYER DCA HB A1C WAIVED: HB A1C: 6.6 % (ref <7.0)

## 2017-07-18 LAB — CMP14+EGFR
ALBUMIN: 3.7 g/dL (ref 3.5–4.8)
ALK PHOS: 128 IU/L — AB (ref 39–117)
ALT: 37 IU/L (ref 0–44)
AST: 67 IU/L — ABNORMAL HIGH (ref 0–40)
Albumin/Globulin Ratio: 1.5 (ref 1.2–2.2)
BILIRUBIN TOTAL: 1 mg/dL (ref 0.0–1.2)
BUN / CREAT RATIO: 12 (ref 10–24)
BUN: 12 mg/dL (ref 8–27)
CO2: 25 mmol/L (ref 20–29)
CREATININE: 1.03 mg/dL (ref 0.76–1.27)
Calcium: 9.1 mg/dL (ref 8.6–10.2)
Chloride: 100 mmol/L (ref 96–106)
GFR calc non Af Amer: 71 mL/min/{1.73_m2} (ref >59)
GFR, EST AFRICAN AMERICAN: 82 mL/min/{1.73_m2} (ref >59)
GLOBULIN, TOTAL: 2.5 g/dL (ref 1.5–4.5)
GLUCOSE: 157 mg/dL — AB (ref 65–99)
Potassium: 4.2 mmol/L (ref 3.5–5.2)
SODIUM: 138 mmol/L (ref 134–144)
TOTAL PROTEIN: 6.2 g/dL (ref 6.0–8.5)

## 2017-07-18 MED ORDER — LISINOPRIL 40 MG PO TABS: 40.0000 mg | ORAL_TABLET | Freq: Every day | ORAL | 1 refills | Status: DC

## 2017-07-18 NOTE — Patient Instructions (Signed)
opthalmalogist  Magnesium 400

## 2017-07-24 NOTE — Progress Notes (Signed)
BP 112/61   Pulse 77   Temp (!) 96.8 F (36 C) (Oral)   Ht '5\' 11"'$  (1.803 m)   Wt 234 lb (106.1 kg)   BMI 32.64 kg/m    Subjective:    Patient ID: [name], male    DOB: October 14, 1940, 76 y.o.   MRN: [id number]  HPI: [name] is a 76 y.o. male presenting on 07/18/2017 for Follow-up (6 month)  This patient comes in for periodic recheck on medications and conditions including hyperglycemia, lower extremity weakness, hypertension, Karlene Lineman.  He is covered at the [facility] for most of his medical conditions.  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  It is noted that he does have a lot of weakness in his lower legs after he stands up.  Once he gets going it feels okay.  He states that his hips are beginning to hurt a lot.  After he stood up in the office today could tell his hips were bothering him a lot.  This caused him to feel a little bit weak when he started walking but he recovered quickly.  All medications are reviewed today. There are no reports of any problems with the medications. All of the medical conditions are reviewed and updated.  Lab work is reviewed and will be ordered as medically necessary. There are no new problems reported with today's visit.   Relevant past medical, surgical, family and social history reviewed and updated as indicated. Allergies and medications reviewed and updated.  Past Medical History:  Diagnosis Date  . 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 Diverticulosis   . Hepatomegaly   . Hx of colonic polyps   . Obesity, unspecified   . Obstructive sleep apnea    Uses CPAP.   Marland Kitchen Other and unspecified hyperlipidemia   . Unspecified essential hypertension     Past Surgical History:  Procedure Laterality Date  . COLONOSCOPY  9/17/20012  . CORONARY STENT PLACEMENT  08/2006   LAD  . POLYPECTOMY  09/17/20012  . TONSILLECTOMY      Review of Systems  Constitutional: Negative  for appetite change and fatigue.  HENT: Negative.   Eyes: Negative.  Negative for pain and visual disturbance.  Respiratory: Negative.  Negative for cough, chest tightness, shortness of breath and wheezing.   Cardiovascular: Negative.  Negative for chest pain, palpitations and leg swelling.  Gastrointestinal: Negative.  Negative for abdominal pain, diarrhea, nausea and vomiting.  Endocrine: Negative.   Genitourinary: Negative.   Musculoskeletal: Positive for arthralgias, back pain, joint swelling and myalgias.  Skin: Negative.  Negative for color change and rash.  Neurological: Positive for weakness. Negative for numbness and headaches.  Psychiatric/Behavioral: Negative.     Allergies as of 07/18/2017   No Known Allergies     Medication List        Accurate as of 07/18/17 11:59 PM. Always use your most recent med list.          aspirin 81 MG tablet Take 81 mg by mouth daily.   atorvastatin 40 MG tablet Commonly known as:  LIPITOR Take one tablet by mouth two days a week   CENTRUM SILVER PO Take 1 capsule by mouth daily.   cyanocobalamin 1000 MCG tablet Take 1,000 mcg by mouth daily.   lisinopril 40 MG tablet Commonly known as:  PRINIVIL,ZESTRIL Take 1 tablet (40 mg total) by mouth daily.   nitroGLYCERIN 0.4  MG SL tablet Commonly known as:  NITROSTAT DISSOLVE 1 TABLET UNDER TONGUE AS NEEDED FOR CHEST PAIN   psyllium 58.6 % packet Commonly known as:  METAMUCIL Take 1 packet by mouth daily.          Objective:    BP 112/61   Pulse 77   Temp (!) 96.8 F (36 C) (Oral)   Ht '5\' 11"'$  (1.803 m)   Wt 234 lb (106.1 kg)   BMI 32.64 kg/m   No Known Allergies  Physical Exam  Constitutional: He appears well-developed and well-nourished.  HENT:  Head: Normocephalic and atraumatic.  Eyes: Conjunctivae and EOM are normal. Pupils are equal, round, and reactive to light.  Neck: Normal range of motion. Neck supple.  Cardiovascular: Normal rate, regular rhythm and  normal heart sounds.  Pulmonary/Chest: Effort normal and breath sounds normal.  Abdominal: Soft. Bowel sounds are normal.  Musculoskeletal: Normal range of motion.  Skin: Skin is warm and dry.    Results for orders placed or performed in visit on 07/18/17  CMP14+EGFR  Result Value Ref Range   Glucose 157 (H) 65 - 99 mg/dL   BUN 12 8 - 27 mg/dL   Creatinine, Ser 1.03 0.76 - 1.27 mg/dL   GFR calc non Af Amer 71 >59 mL/min/1.73   GFR calc Af Amer 82 >59 mL/min/1.73   BUN/Creatinine Ratio 12 10 - 24   Sodium 138 134 - 144 mmol/L   Potassium 4.2 3.5 - 5.2 mmol/L   Chloride 100 96 - 106 mmol/L   CO2 25 20 - 29 mmol/L   Calcium 9.1 8.6 - 10.2 mg/dL   Total Protein 6.2 6.0 - 8.5 g/dL   Albumin 3.7 3.5 - 4.8 g/dL   Globulin, Total 2.5 1.5 - 4.5 g/dL   Albumin/Globulin Ratio 1.5 1.2 - 2.2   Bilirubin Total 1.0 0.0 - 1.2 mg/dL   Alkaline Phosphatase 128 (H) 39 - 117 IU/L   AST 67 (H) 0 - 40 IU/L   ALT 37 0 - 44 IU/L  Bayer DCA Hb A1c Waived  Result Value Ref Range   Bayer DCA Hb A1c Waived 6.6 <7.0 %      Assessment & Plan:   1. Hyperglycemia - CMP14+EGFR - Bayer DCA Hb A1c Waived  2. Weakness of both lower extremities  3. Essential hypertension  4. Fatty liver disease, nonalcoholic    Current Outpatient Medications:  .  aspirin 81 MG tablet, Take 81 mg by mouth daily.  , Disp: , Rfl:  .  atorvastatin (LIPITOR) 40 MG tablet, Take one tablet by mouth two days a week, Disp: 8 tablet, Rfl: 11 .  cyanocobalamin 1000 MCG tablet, Take 1,000 mcg by mouth daily., Disp: , Rfl:  .  lisinopril (PRINIVIL,ZESTRIL) 40 MG tablet, Take 1 tablet (40 mg total) by mouth daily., Disp: 90 tablet, Rfl: 1 .  Multiple Vitamins-Minerals (CENTRUM SILVER PO), Take 1 capsule by mouth daily.  , Disp: , Rfl:  .  nitroGLYCERIN (NITROSTAT) 0.4 MG SL tablet, DISSOLVE 1 TABLET UNDER TONGUE AS NEEDED FOR CHEST PAIN, Disp: 25 tablet, Rfl: 2 .  psyllium (METAMUCIL) 58.6 % packet, Take 1 packet by mouth  daily., Disp: , Rfl:  Continue all other maintenance medications as listed above.  Follow up plan: Return in about 6 months (around 01/16/2018) for recheck.  Educational handout given for [place] PA-C [facility] [address]  [place], [place] [address] [phone]   07/24/2017, 10:07 AM

## 2017-07-25 ENCOUNTER — Encounter: Payer: Self-pay | Admitting: *Deleted

## 2017-07-26 ENCOUNTER — Telehealth: Payer: Self-pay | Admitting: Physician Assistant

## 2017-07-26 ENCOUNTER — Telehealth: Payer: Self-pay

## 2017-07-26 MED ORDER — METFORMIN HCL 500 MG PO TABS: 500.0000 mg | ORAL_TABLET | Freq: Every day | ORAL | 1 refills | Status: DC

## 2017-07-26 NOTE — Telephone Encounter (Signed)
Aware of lab results  

## 2017-07-26 NOTE — Telephone Encounter (Signed)
Went over labs with patient and patient agreed to start the Metformin 500mg  daily . Rx sent- FYI

## 2017-09-02 NOTE — Progress Notes (Signed)
Cardiology Office Note   Date:  09/04/2017   ID:  [name], DOB 09-03-1940, MRN [id number]  PCP:  [name], PA-C  Cardiologist:   Minus Breeding, MD  Referring:  Particia Nearing   Chief Complaint  Patient presents with  . Coronary Artery Disease     History of Present Illness: [name] is a 77 y.o. male who presents for follow up of CAD. He had a DES to the LAD in 2/08. Repeat cath in 11/11 showed patent stent.  Echo in 2/16 showed EF 55-60%, no significant valvular abnormalities.  At the last visit I stopped his Plavix and continued ASA only.    He returns for follow up.     Since I last saw him he has done well.  The patient denies any new symptoms such as chest discomfort, neck or arm discomfort. There has been no new shortness of breath, PND or orthopnea. There have been no reported palpitations, presyncope or syncope.  He goes to the [facility] 3 x per week and does well with aerobic exercise.    Past Medical History:  Diagnosis Date  . 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 Diverticulosis   . Hepatomegaly   . Hx of colonic polyps   . Obesity, unspecified   . Obstructive sleep apnea    Uses CPAP.   Marland Kitchen Other and unspecified hyperlipidemia   . Unspecified essential hypertension     Past Surgical History:  Procedure Laterality Date  . COLONOSCOPY  9/17/20012  . CORONARY STENT PLACEMENT  08/2006   LAD  . POLYPECTOMY  09/17/20012  . TONSILLECTOMY       Current Outpatient Medications  Medication Sig Dispense Refill  . aspirin 81 MG tablet Take 81 mg by mouth daily.      Marland Kitchen atorvastatin (LIPITOR) 40 MG tablet Take one tablet by mouth two days a week 8 tablet 11  . cyanocobalamin 1000 MCG tablet Take 1,000 mcg by mouth daily.    Marland Kitchen lisinopril (PRINIVIL,ZESTRIL) 40 MG tablet Take 1 tablet (40 mg total) by mouth daily. 90 tablet 1  . metFORMIN (GLUCOPHAGE) 500 MG tablet Take 1 tablet (500 mg total) by mouth daily with supper. 90  tablet 1  . Multiple Vitamins-Minerals (CENTRUM SILVER PO) Take 1 capsule by mouth daily.      . nitroGLYCERIN (NITROSTAT) 0.4 MG SL tablet DISSOLVE 1 TABLET UNDER TONGUE AS NEEDED FOR CHEST PAIN 25 tablet 2  . psyllium (METAMUCIL) 58.6 % packet Take 1 packet by mouth daily.     No current facility-administered medications for this visit.     Allergies:   Patient has no known allergies.    ROS:  Please see the history of present illness.   Otherwise, review of systems are positive for gout.   All other systems are reviewed and negative.    PHYSICAL EXAM: VS:  BP 135/72   Pulse 99   Ht 5\' 11"  (1.803 m)   Wt 235 lb (106.6 kg)   BMI 32.78 kg/m  , BMI Body mass index is 32.78 kg/m.  GENERAL:  Well appearing NECK:  No jugular venous distention, waveform within normal limits, carotid upstroke brisk and symmetric, no bruits, no thyromegaly LUNGS:  Clear to auscultation bilaterally CHEST:  Unremarkable HEART:  PMI not displaced or sustained,S1 and S2 within normal limits, no S3, no S4, no clicks, no rubs, no murmurs, irregular ABD:  Flat, positive bowel sounds normal  in frequency in pitch, no bruits, no rebound, no guarding, no midline pulsatile mass, no hepatomegaly, no splenomegaly EXT:  2 plus pulses throughout, trace edema, no cyanosis no clubbing    EKG:  EKG is  ordered today. The ekg ordered today demonstrates Sinus rhythm, rate 98, axis within normal limits, intervals within normal limits, no acute ST-T wave changes.   Recent Labs: 07/18/2017: ALT 37; BUN 12; Creatinine, Ser 1.03; Potassium 4.2; Sodium 138    Lipid Panel    Component Value Date/Time   CHOL 134 01/08/2017 0903   CHOL 138 11/06/2012 0945   TRIG 100 01/08/2017 0903   TRIG 108 07/07/2013 0952   TRIG 98 11/06/2012 0945   HDL 40 01/08/2017 0903   HDL 45 07/07/2013 0952   HDL 42 11/06/2012 0945   CHOLHDL 3.4 01/08/2017 0903   CHOLHDL 3 09/09/2014 1451   VLDL 24.6 09/09/2014 1451   LDLCALC 74 01/08/2017  0903   LDLCALC 74 07/07/2013 0952   LDLCALC 76 11/06/2012 0945      Wt Readings from Last 3 Encounters:  09/04/17 235 lb (106.6 kg)  07/18/17 234 lb (106.1 kg)  03/27/17 234 lb (106.1 kg)      Other studies Reviewed: Additional studies/ records that were reviewed today include: None Review of the above records demonstrates:    ASSESSMENT AND PLAN:    CAD:  The patient has no ongoing symptoms.   No change in therapy is indicated.   DYSLIPIDEMIA:  LDL was at target.  He will continue with meds as listed.   OBESITY:  Weight is stable but we talked about goals for weight loss.  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   He has no symptoms related to this.  No change in therapy is indicated.     Current medicines are reviewed at length with the patient today.  The patient does not have concerns regarding medicines.  The following changes have been made:    None  Labs/ tests ordered today include: None  Orders Placed This Encounter  Procedures  . EKG 12-Lead     Disposition:   FU with me in 12 months.       Signed, Minus Breeding, MD  09/04/2017 1:51 PM    [place]

## 2017-09-04 ENCOUNTER — Ambulatory Visit: Payer: Medicare HMO | Admitting: Cardiology

## 2017-09-04 ENCOUNTER — Ambulatory Visit (INDEPENDENT_AMBULATORY_CARE_PROVIDER_SITE_OTHER): Payer: Medicare HMO | Admitting: *Deleted

## 2017-09-04 ENCOUNTER — Encounter: Payer: Self-pay | Admitting: *Deleted

## 2017-09-04 ENCOUNTER — Encounter: Payer: Self-pay | Admitting: Cardiology

## 2017-09-04 VITALS — BP 135/72 | HR 99 | Ht 71.0 in | Wt 235.0 lb

## 2017-09-04 VITALS — BP 135/72 | Ht 71.0 in | Wt 235.0 lb

## 2017-09-04 DIAGNOSIS — I2510 Atherosclerotic heart disease of native coronary artery without angina pectoris: Secondary | ICD-10-CM | POA: Diagnosis not present

## 2017-09-04 DIAGNOSIS — Z0000 Encounter for general adult medical examination without abnormal findings: Secondary | ICD-10-CM | POA: Diagnosis not present

## 2017-09-04 DIAGNOSIS — E785 Hyperlipidemia, unspecified: Secondary | ICD-10-CM

## 2017-09-04 MED ORDER — NITROGLYCERIN 0.4 MG SL SUBL: SUBLINGUAL_TABLET | SUBLINGUAL | 2 refills | Status: DC

## 2017-09-04 NOTE — Progress Notes (Signed)
Advanced directives given   Subjective:   [name] is a 77 y.o. male who presents for an Initial Medicare Annual Wellness Visit. 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 He has one adult son and two stepchildren. His teenage step-granddaughter has moved in with him and his wife.   Review of Systems  Health is about the same as last year.   Cardiac Risk Factors include: advanced age (>78men, >59 women);hypertension;obesity (BMI >30kg/m2);male gender;dyslipidemia;diabetes mellitus;smoking/ tobacco exposure  Other systems negative   Objective:    Today's Vitals   09/04/17 1403  BP: 135/72  Weight: 235 lb (106.6 kg)  Height: 5\' 11"  (1.803 m)   Body mass index is 32.78 kg/m.  Advanced Directives 09/04/2017 09/04/2017 03/24/2015 12/21/2014  Does Patient Have a Medical Advance Directive? No No No No  Would patient like information on creating a medical advance directive? Yes (MAU/Ambulatory/Procedural Areas - Information given) No - Patient declined No - patient declined information Yes - Educational materials given    Current Medications (verified) Outpatient Encounter Medications as of 09/04/2017  Medication Sig  . aspirin 81 MG tablet Take 81 mg by mouth daily.    Marland Kitchen atorvastatin (LIPITOR) 40 MG tablet Take one tablet by mouth two days a week  . cyanocobalamin 1000 MCG tablet Take 1,000 mcg by mouth daily.  Marland Kitchen lisinopril (PRINIVIL,ZESTRIL) 40 MG tablet Take 1 tablet (40 mg total) by mouth daily.  . metFORMIN (GLUCOPHAGE) 500 MG tablet Take 1 tablet (500 mg total) by mouth daily with supper.  . Multiple Vitamins-Minerals (CENTRUM SILVER PO) Take 1 capsule by mouth daily.    . nitroGLYCERIN (NITROSTAT) 0.4 MG SL tablet DISSOLVE 1 TABLET UNDER TONGUE AS NEEDED FOR CHEST PAIN  . psyllium (METAMUCIL) 58.6 % packet Take 1 packet by mouth daily.   No facility-administered encounter medications on file as of 09/04/2017.     Allergies  (verified) Patient has no known allergies.   History: Past Medical History:  Diagnosis Date  . 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 Diverticulosis   . Hepatomegaly   . Hx of colonic polyps   . Obesity, unspecified   . Obstructive sleep apnea    Uses CPAP.   Marland Kitchen Other and unspecified hyperlipidemia   . Unspecified essential hypertension    Past Surgical History:  Procedure Laterality Date  . COLONOSCOPY  9/17/20012  . CORONARY STENT PLACEMENT  08/2006   LAD  . POLYPECTOMY  09/17/20012  . TONSILLECTOMY     Family History  Problem Relation Age of Onset  . Heart disease Father        and Multiple family members on father Side   . Cancer Mother   . Hypertension Mother   . Hypertension Sister   . Irritable bowel syndrome Maternal Grandmother   . Colon cancer Neg Hx    Social History   Socioeconomic History  . Marital status: Married    Spouse name: Not on file  . Number of children: 1  . Years of education: 12 years  . Highest education level: Some college, no degree  Social Needs  . Financial resource strain: Not hard at all  . Food insecurity - worry: Never true  . Food insecurity - inability: Never true  . Transportation needs - medical: No  . Transportation needs - non-medical: No  Occupational History  . Occupation: Nurse, learning disability    Comment: Retired   .  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 Smoking status: Former Smoker    Packs/day: 1.00    Types: Cigarettes    Last attempt to quit: 03/06/1971    Years since quitting: 46.5  . Smokeless tobacco: Former Systems developer    Types: [place] date: 07/30/1989  Substance and Sexual Activity  . Alcohol use: No    Comment: 1 drink daily   . Drug use: No  . Sexual activity: No  Other Topics Concern  . 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 He is married and lives at home  with his wife. He has one adult son. Their teenage granddaughter has recently moved in with them.    Tobacco Counseling No tobacco use  Clinical Intake:     Pain : No/denies pain     Diabetes: Yes CBG done?: No Did pt. bring in CBG monitor from home?: No  How often do you need to have someone help you when you read instructions, pamphlets, or other written materials from your doctor or pharmacy?: 1 - Never What is the last grade level you completed in school?: High school, 1 year business college     Information entered by :: Jared Sicilian, RN  Activities of Daily Living In your present state of health, do you have any difficulty performing the following activities: 09/04/2017  Hearing? Y  Comment has hearing aids and they work well  Vision? N  Comment Sees the eye doctor at the [place]. Last exam was summer 2018  Difficulty concentrating or making decisions? Y  Comment Has noticed some decline in short term memory  Walking or climbing stairs? N  Dressing or bathing? N  Doing errands, shopping? N  Preparing Food and eating ? N  Using the Toilet? N  In the past six months, have you accidently leaked urine? N  Do you have problems with loss of bowel control? N  Managing your Medications? N  Managing your Finances? N  Housekeeping or managing your Housekeeping? N  Some recent data might be hidden     Immunizations and Health Maintenance Immunization History  Administered Date(s) Administered  . Influenza Split 04/29/2013  . Influenza,inj,Quad PF,6+ Mos 05/02/2015  . Influenza-Unspecified 05/26/2014, 05/15/2016, 05/20/2017  . Pneumococcal Conjugate-13 06/14/2014  . Pneumococcal Polysaccharide-23 06/09/2010  . Tdap 01/12/2011  . Zoster 10/29/2014   Health Maintenance Due  Topic Date Due  . FOOT EXAM  12/13/1950  . OPHTHALMOLOGY EXAM  12/13/1950    Patient Care Team: [name] as PCP - General (Physician Assistant) Jared Dresser, MD as Consulting  Physician (Cardiology) Jared Shipper, MD as Consulting Physician (Gastroenterology)  No hospitalizations, ER visits. Foot surgery by Dr [name]     Assessment:   This is a routine wellness examination for [name].  Hearing/Vision screen No deficits noted during visit.   Dietary issues and exercise activities discussed: Current Exercise Habits: Structured exercise class, Type of exercise: treadmill;strength training/weights, Time (Minutes): 60, Frequency (Times/Week): 3, Weekly Exercise (Minutes/Week): 180, Intensity: Moderate, Exercise limited by: None identified  Goals    . Exercise 3x per week (30 min per time)     Continue to exercise 3 times per week      Depression Screen PHQ 2/9 Scores 09/04/2017 07/18/2017 01/08/2017 07/10/2016  PHQ - 2 Score 0 0 0 0  PHQ- 9  Score - - - 0    Fall Risk Fall Risk  09/04/2017 07/18/2017 01/08/2017 05/25/2016 01/05/2016  Falls in the past year? No No No No No    Is the patient's home free of loose throw rugs in walkways, pet beds, electrical cords, etc?   yes      Grab bars in the bathroom? no      Handrails on the stairs?   yes      Adequate lighting?   yes   Cognitive Function: MMSE - Mini Mental State Exam 09/04/2017 05/25/2016 12/21/2014  Orientation to time 5 5 5   Orientation to Place 5 5 5   Registration 3 3 3   Attention/ Calculation 5 5 5   Recall 3 3 3   Language- name 2 objects 2 2 2   Language- repeat 1 1 1   Language- follow 3 step command 3 3 3   Language- read & follow direction 1 1 1   Write a sentence 1 1 1   Copy design 1 1 1   Total score 30 30 30         Screening Tests Health Maintenance  Topic Date Due  . FOOT EXAM  12/13/1950  . OPHTHALMOLOGY EXAM  12/13/1950  . HEMOGLOBIN A1C  01/16/2018  . COLONOSCOPY  03/23/2020  . TETANUS/TDAP  01/11/2021  . INFLUENZA VACCINE  Completed  . PNA vac Low Risk Adult  Completed     eye exam was done at the [facility]  Plan:  Continue to stay active for at least 150 minutes of moderate  activity a week Keep f/u appt with PCP and specialist Consider Shingrix Review Advance Directives. Bring a signed/notarized copy to our office  I have personally reviewed and noted the following in the patient's chart:   . Medical and social history . Use of alcohol, tobacco or illicit drugs  . Current medications and supplements . Functional ability and status . Nutritional status . Physical activity . Advanced directives . List of other physicians . Hospitalizations, surgeries, and ER visits in previous 12 months . Vitals . Screenings to include cognitive, depression, and falls . Referrals and appointments  In addition, I have reviewed and discussed with patient certain preventive protocols, quality metrics, and best practice recommendations. A written personalized care plan for preventive services as well as general preventive health recommendations were provided to patient.     Jared Sicilian, RN   09/04/2017    I have reviewed and agree with the above AWV documentation.   [name] PA-C [place] [address]  [place], [place] [address] [phone]

## 2017-09-04 NOTE — Patient Instructions (Signed)
  [name] , Thank you for taking time to come for your Medicare Wellness Visit. I appreciate your ongoing commitment to your health goals. Please review the following plan we discussed and let me know if I can assist you in the future.   These are the goals we discussed: Goals    . Exercise 3x per week (30 min per time)     Continue to exercise 3 times per week       This is a list of the screening recommended for you and due dates:  Health Maintenance  Topic Date Due  . Complete foot exam   [birth year]13/1950  . Eye exam for diabetics  [birth year]13/1950  . Hemoglobin A1C  01/16/2018  . Colon Cancer Screening  03/23/2020  . Tetanus Vaccine  01/11/2021  . Flu Shot  Completed  . Pneumonia vaccines  Completed

## 2017-09-04 NOTE — Patient Instructions (Signed)

## 2017-10-02 DIAGNOSIS — G4733 Obstructive sleep apnea (adult) (pediatric): Secondary | ICD-10-CM | POA: Diagnosis not present

## 2017-10-11 ENCOUNTER — Ambulatory Visit: Payer: Medicare Other | Admitting: Physician Assistant

## 2017-10-16 ENCOUNTER — Ambulatory Visit (INDEPENDENT_AMBULATORY_CARE_PROVIDER_SITE_OTHER): Payer: Medicare HMO | Admitting: Physician Assistant

## 2017-10-16 ENCOUNTER — Encounter: Payer: Self-pay | Admitting: Physician Assistant

## 2017-10-16 VITALS — BP 134/68 | HR 73 | Temp 98.0°F

## 2017-10-16 DIAGNOSIS — R7309 Other abnormal glucose: Secondary | ICD-10-CM

## 2017-10-16 DIAGNOSIS — E669 Obesity, unspecified: Secondary | ICD-10-CM | POA: Diagnosis not present

## 2017-10-16 DIAGNOSIS — G629 Polyneuropathy, unspecified: Secondary | ICD-10-CM | POA: Diagnosis not present

## 2017-10-16 DIAGNOSIS — E785 Hyperlipidemia, unspecified: Secondary | ICD-10-CM | POA: Diagnosis not present

## 2017-10-16 DIAGNOSIS — I499 Cardiac arrhythmia, unspecified: Secondary | ICD-10-CM | POA: Diagnosis not present

## 2017-10-16 DIAGNOSIS — R7303 Prediabetes: Secondary | ICD-10-CM | POA: Diagnosis not present

## 2017-10-16 DIAGNOSIS — Z7982 Long term (current) use of aspirin: Secondary | ICD-10-CM | POA: Diagnosis not present

## 2017-10-16 DIAGNOSIS — I10 Essential (primary) hypertension: Secondary | ICD-10-CM | POA: Diagnosis not present

## 2017-10-16 DIAGNOSIS — I2510 Atherosclerotic heart disease of native coronary artery without angina pectoris: Secondary | ICD-10-CM | POA: Diagnosis not present

## 2017-10-16 DIAGNOSIS — K5900 Constipation, unspecified: Secondary | ICD-10-CM | POA: Diagnosis not present

## 2017-10-16 DIAGNOSIS — Z6832 Body mass index (BMI) 32.0-32.9, adult: Secondary | ICD-10-CM | POA: Diagnosis not present

## 2017-10-16 LAB — BAYER DCA HB A1C WAIVED: HB A1C (BAYER DCA - WAIVED): 5.6 % (ref <7.0)

## 2017-10-16 NOTE — Progress Notes (Signed)
BP 134/68   Pulse 73   Temp 98 F (36.7 C) (Oral)    Subjective:    Patient ID: [name], male    DOB: 1940-12-05, 77 y.o.   MRN: [id number]  HPI: [name] is a 78 y.o. male presenting on 10/16/2017 for Diabetes (3 month recheck)  Patient comes in for recheck on his elevated glucose.   He has never been a diabetic before.  He had started metformin 500 mg 1 daily, is tolerating it well.  We will have an A1c drawn today.  He has been doing very good with his diet and exercise.   Past Medical History:  Diagnosis Date  . 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 Diverticulosis   . Hepatomegaly   . Hx of colonic polyps   . Obesity, unspecified   . Obstructive sleep apnea    Uses CPAP.   Marland Kitchen Other and unspecified hyperlipidemia   . Unspecified essential hypertension    Relevant past medical, surgical, family and social history reviewed and updated as indicated. Interim medical history since our last visit reviewed. Allergies and medications reviewed and updated. DATA REVIEWED: CHART IN EPIC  Family History reviewed for pertinent findings.  Review of Systems  Constitutional: Negative.  Negative for appetite change and fatigue.  Eyes: Negative for pain and visual disturbance.  Respiratory: Negative.  Negative for cough, chest tightness, shortness of breath and wheezing.   Cardiovascular: Negative.  Negative for chest pain, palpitations and leg swelling.  Gastrointestinal: Negative.  Negative for abdominal pain, diarrhea, nausea and vomiting.  Genitourinary: Negative.   Skin: Negative.  Negative for color change and rash.  Neurological: Negative.  Negative for weakness, numbness and headaches.  Psychiatric/Behavioral: Negative.     Allergies as of 10/16/2017   No Known Allergies     Medication List        Accurate as of 10/16/17 11:39 AM. Always use your most recent med list.          aspirin 81 MG tablet Take 81 mg by mouth  daily.   atorvastatin 40 MG tablet Commonly known as:  LIPITOR Take one tablet by mouth two days a week   CENTRUM SILVER PO Take 1 capsule by mouth daily.   cyanocobalamin 1000 MCG tablet Take 1,000 mcg by mouth daily.   lisinopril 40 MG tablet Commonly known as:  PRINIVIL,ZESTRIL Take 1 tablet (40 mg total) by mouth daily.   metFORMIN 500 MG tablet Commonly known as:  GLUCOPHAGE Take 1 tablet (500 mg total) by mouth daily with supper.   nitroGLYCERIN 0.4 MG SL tablet Commonly known as:  NITROSTAT DISSOLVE 1 TABLET UNDER TONGUE AS NEEDED FOR CHEST PAIN   psyllium 58.6 % packet Commonly known as:  METAMUCIL Take 1 packet by mouth daily.          Objective:    BP 134/68   Pulse 73   Temp 98 F (36.7 C) (Oral)   No Known Allergies  Wt Readings from Last 3 Encounters:  09/04/17 235 lb (106.6 kg)  09/04/17 235 lb (106.6 kg)  07/18/17 234 lb (106.1 kg)    Physical Exam  Constitutional: He appears well-developed and well-nourished. No distress.  HENT:  Head: Normocephalic and atraumatic.  Eyes: Conjunctivae and EOM are normal. Pupils are equal, round, and reactive to light.  Pulmonary/Chest: Effort normal.  Skin: Skin is warm and dry.  Psychiatric: He has a normal mood and affect.  His behavior is normal.  Nursing note and vitals reviewed.   Results for orders placed or performed in visit on 07/18/17  CMP14+EGFR  Result Value Ref Range   Glucose 157 (H) 65 - 99 mg/dL   BUN 12 8 - 27 mg/dL   Creatinine, Ser 1.03 0.76 - 1.27 mg/dL   GFR calc non Af Amer 71 >59 mL/min/1.73   GFR calc Af Amer 82 >59 mL/min/1.73   BUN/Creatinine Ratio 12 10 - 24   Sodium 138 134 - 144 mmol/L   Potassium 4.2 3.5 - 5.2 mmol/L   Chloride 100 96 - 106 mmol/L   CO2 25 20 - 29 mmol/L   Calcium 9.1 8.6 - 10.2 mg/dL   Total Protein 6.2 6.0 - 8.5 g/dL   Albumin 3.7 3.5 - 4.8 g/dL   Globulin, Total 2.5 1.5 - 4.5 g/dL   Albumin/Globulin Ratio 1.5 1.2 - 2.2   Bilirubin Total 1.0 0.0 -  1.2 mg/dL   Alkaline Phosphatase 128 (H) 39 - 117 IU/L   AST 67 (H) 0 - 40 IU/L   ALT 37 0 - 44 IU/L  Bayer DCA Hb A1c Waived  Result Value Ref Range   Bayer DCA Hb A1c Waived 6.6 <7.0 %      Assessment & Plan:   1. Elevated glucose - Bayer DCA Hb A1c Waived   Continue all other maintenance medications as listed above.  Follow up plan: Keep follow-up appointment.  Educational handout given for [place] PA-C [place] [address]  [place], [place] [address] [phone]   10/16/2017, 11:39 AM

## 2017-12-09 DIAGNOSIS — G4733 Obstructive sleep apnea (adult) (pediatric): Secondary | ICD-10-CM | POA: Diagnosis not present

## 2018-01-21 ENCOUNTER — Ambulatory Visit (INDEPENDENT_AMBULATORY_CARE_PROVIDER_SITE_OTHER): Payer: Medicare HMO | Admitting: Physician Assistant

## 2018-01-21 ENCOUNTER — Encounter: Payer: Self-pay | Admitting: Physician Assistant

## 2018-01-21 VITALS — BP 127/63 | HR 109 | Temp 99.1°F | Ht 71.0 in | Wt 232.4 lb

## 2018-01-21 DIAGNOSIS — E782 Mixed hyperlipidemia: Secondary | ICD-10-CM

## 2018-01-21 DIAGNOSIS — I10 Essential (primary) hypertension: Secondary | ICD-10-CM

## 2018-01-21 DIAGNOSIS — R7309 Other abnormal glucose: Secondary | ICD-10-CM

## 2018-01-21 DIAGNOSIS — G4733 Obstructive sleep apnea (adult) (pediatric): Secondary | ICD-10-CM

## 2018-01-21 NOTE — Progress Notes (Signed)
.   BP 127/63   Pulse (!) 109   Temp 99.1 F (37.3 C) (Oral)   Ht _0  (1.803 m)   Wt 232 lb 6.4 oz (105.4 kg)   BMI 32.41 kg/m     Subjective:    Patient ID: [name], male    DOB: [birth year], 77 y.o.   MRN: [id number]  HPI: [name] is a 77 y.o. 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  The patient has had it since 1999.  He has been on a machine continuously since then his current one has broken he gets her first through we need to write a new order and send previous records to them.  He reports that he is doing well using machine regularly.  He also has had fairly good sugar reports and does need labs before he has no other complaints at this time.  Past Medical History:  Diagnosis Date  . 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 Diverticulosis   . Hepatomegaly   . Hx of colonic polyps   . Obesity, unspecified   . Obstructive sleep apnea    Uses CPAP.   Marland Kitchen Other and unspecified hyperlipidemia   . Unspecified essential hypertension    Relevant past medical, surgical, family and social history reviewed and updated as indicated. Interim medical history since our last visit reviewed. Allergies and medications reviewed and updated. DATA REVIEWED: CHART IN EPIC  Family History reviewed for pertinent findings.  Review of Systems  Constitutional: Positive for fatigue and unexpected weight change. Negative for appetite change.  HENT: Negative.   Eyes: Negative.  Negative for pain and visual disturbance.  Respiratory: Negative.  Negative for cough, chest tightness, shortness of breath and wheezing.   Cardiovascular: Negative.  Negative for chest pain, palpitations and leg swelling.  Gastrointestinal: Negative.  Negative for abdominal pain, diarrhea, nausea and vomiting.  Endocrine: Negative.   Genitourinary: Negative.     Musculoskeletal: Negative.   Skin: Negative.  Negative for color change and rash.  Neurological: Negative.  Negative for weakness, numbness and headaches.  Psychiatric/Behavioral: Negative.     Allergies as of 01/21/2018   No Known Allergies     Medication List        Accurate as of 01/21/18  9:25 AM. Always use your most recent med list.          aspirin 81 MG tablet Take 81 mg by mouth daily.   atorvastatin 40 MG tablet Commonly known as:  LIPITOR Take one tablet by mouth two days a week   CENTRUM SILVER PO Take 1 capsule by mouth daily.   cyanocobalamin 1000 MCG tablet Take 1,000 mcg by mouth daily.   lisinopril 40 MG tablet Commonly known as:  PRINIVIL,ZESTRIL Take 1 tablet (40 mg total) by mouth daily.   metFORMIN 500 MG tablet Commonly known as:  GLUCOPHAGE Take 1 tablet (500 mg total) by mouth daily with supper.   nitroGLYCERIN 0.4 MG SL tablet Commonly known as:  NITROSTAT DISSOLVE 1 TABLET UNDER TONGUE AS NEEDED FOR CHEST PAIN   psyllium 58.6 % packet Commonly known as:  METAMUCIL Take 1 packet by mouth daily.            Durable Medical Equipment  (From admission, onward)        Start     Ordered   01/21/18 0000  For home use only DME continuous positive airway pressure (CPAP)    Comments:  Obstructive Sleep Apnea Old machine is not repairable  Question Answer Comment  Patient has OSA or probable OSA Yes   Is the patient currently using CPAP in the home Yes   If no (to question two) date of sleep study 11/16/97   Date of face to face encounter 01/21/18   Settings 5-10   Signs and symptoms of probable OSA  (select all that apply) Snoring   Signs and symptoms of probable OSA  (select all that apply) Witnessed apneas   CPAP supplies needed Mask, headgear, cushions, filters, heated tubing and water chamber      01/21/18 0921         Objective:    BP 127/63   Pulse (!) 109   Temp 99.1 F (37.3 C) (Oral)   Ht _0  (1.803 m)   Wt  232 lb 6.4 oz (105.4 kg)   BMI 32.41 kg/m    No Known Allergies  Wt Readings from Last 3 Encounters:  01/21/18 232 lb 6.4 oz (105.4 kg)  09/04/17 235 lb (106.6 kg)  09/04/17 235 lb (106.6 kg)    Physical Exam  Constitutional: He appears well-developed and well-nourished. No distress.  HENT:  Head: Normocephalic and atraumatic.  Eyes: Pupils are equal, round, and reactive to light. Conjunctivae and EOM are normal.  Cardiovascular: Normal rate, regular rhythm and normal heart sounds.  Pulmonary/Chest: Effort normal and breath sounds normal. No respiratory distress.  Skin: Skin is warm and dry.  Psychiatric: He has a normal mood and affect. His behavior is normal.  Nursing note and vitals reviewed.   Results for orders placed or performed in visit on 10/16/17  Bayer DCA Hb A1c Waived  Result Value Ref Range   HB A1C (BAYER DCA - WAIVED) 5.6 <7.0 %      Assessment & Plan:   1. Elevated glucose - CMP14+EGFR; Future - Bayer DCA Hb A1c Waived; Future  2. Essential hypertension - CBC with Differential/Platelet; Future - CMP14+EGFR; Future - Lipid panel; Future - Bayer DCA Hb A1c Waived; Future  3. Mixed hyperlipidemia - Lipid panel; Future  4. SLEEP APNEA, OBSTRUCTIVE - For home use only DME continuous positive airway pressure (CPAP)   Continue all other maintenance medications as listed above.  Follow up plan: Return in about 6 months (around 07/23/2018).  Educational handout given for [place] PA-C [place] [address]  [place], [place] [address] [phone]   01/21/2018, 9:25 AM

## 2018-01-27 ENCOUNTER — Telehealth: Payer: Self-pay | Admitting: Physician Assistant

## 2018-01-27 NOTE — Telephone Encounter (Signed)
Spoke with patient and he is wanting to know if we have received copy of his sleep study that was done 10 years ago. Advised patient that I would try and get a copy of the report and notify patient when its received

## 2018-01-27 NOTE — Telephone Encounter (Signed)
Pt wants to speak to [facility] or her nurse, when asked what the pt was needing to speak to them in regards to, he got hateful and said that he didn't need to give me any information, advised him that he did not have to give me details but a general reason, he said that it was personal and he didn't need to tell me anything. Told pt that I would put a message in for them to call him

## 2018-01-29 ENCOUNTER — Other Ambulatory Visit: Payer: Medicare HMO

## 2018-01-29 DIAGNOSIS — R7309 Other abnormal glucose: Secondary | ICD-10-CM | POA: Diagnosis not present

## 2018-01-29 DIAGNOSIS — E782 Mixed hyperlipidemia: Secondary | ICD-10-CM | POA: Diagnosis not present

## 2018-01-29 DIAGNOSIS — I10 Essential (primary) hypertension: Secondary | ICD-10-CM

## 2018-01-29 LAB — BAYER DCA HB A1C WAIVED: HB A1C (BAYER DCA - WAIVED): 6.1 % (ref <7.0)

## 2018-01-30 LAB — CMP14+EGFR
A/G RATIO: 1.1 — AB (ref 1.2–2.2)
ALBUMIN: 3.4 g/dL — AB (ref 3.5–4.8)
ALK PHOS: 141 IU/L — AB (ref 39–117)
ALT: 38 IU/L (ref 0–44)
AST: 95 IU/L — ABNORMAL HIGH (ref 0–40)
BILIRUBIN TOTAL: 1.2 mg/dL (ref 0.0–1.2)
BUN / CREAT RATIO: 10 (ref 10–24)
BUN: 9 mg/dL (ref 8–27)
CHLORIDE: 98 mmol/L (ref 96–106)
CO2: 25 mmol/L (ref 20–29)
CREATININE: 0.93 mg/dL (ref 0.76–1.27)
Calcium: 9.4 mg/dL (ref 8.6–10.2)
GFR calc Af Amer: 92 mL/min/{1.73_m2} (ref >59)
GFR calc non Af Amer: 79 mL/min/{1.73_m2} (ref >59)
GLOBULIN, TOTAL: 3.1 g/dL (ref 1.5–4.5)
Glucose: 129 mg/dL — ABNORMAL HIGH (ref 65–99)
POTASSIUM: 4.5 mmol/L (ref 3.5–5.2)
SODIUM: 137 mmol/L (ref 134–144)
Total Protein: 6.5 g/dL (ref 6.0–8.5)

## 2018-01-30 LAB — CBC WITH DIFFERENTIAL/PLATELET
Basophils Absolute: 0.1 10*3/uL (ref 0.0–0.2)
Basos: 1 %
EOS (ABSOLUTE): 0.1 10*3/uL (ref 0.0–0.4)
EOS: 1 %
HEMATOCRIT: 38.4 % (ref 37.5–51.0)
Hemoglobin: 13.3 g/dL (ref 13.0–17.7)
Immature Grans (Abs): 0 10*3/uL (ref 0.0–0.1)
Immature Granulocytes: 0 %
LYMPHS ABS: 1.6 10*3/uL (ref 0.7–3.1)
Lymphs: 23 %
MCH: 36.8 pg — ABNORMAL HIGH (ref 26.6–33.0)
MCHC: 34.6 g/dL (ref 31.5–35.7)
MCV: 106 fL — AB (ref 79–97)
MONOS ABS: 0.7 10*3/uL (ref 0.1–0.9)
Monocytes: 10 %
Neutrophils Absolute: 4.5 10*3/uL (ref 1.4–7.0)
Neutrophils: 65 %
Platelets: 123 10*3/uL — ABNORMAL LOW (ref 150–450)
RBC: 3.61 x10E6/uL — AB (ref 4.14–5.80)
RDW: 13.1 % (ref 12.3–15.4)
WBC: 6.9 10*3/uL (ref 3.4–10.8)

## 2018-01-30 LAB — LIPID PANEL
CHOL/HDL RATIO: 3.3 ratio (ref 0.0–5.0)
Cholesterol, Total: 131 mg/dL (ref 100–199)
HDL: 40 mg/dL (ref >39)
LDL CALC: 78 mg/dL (ref 0–99)
TRIGLYCERIDES: 66 mg/dL (ref 0–149)
VLDL CHOLESTEROL CAL: 13 mg/dL (ref 5–40)

## 2018-02-03 ENCOUNTER — Telehealth: Payer: Self-pay | Admitting: Physician Assistant

## 2018-02-03 ENCOUNTER — Telehealth: Payer: Self-pay

## 2018-02-03 NOTE — Telephone Encounter (Signed)
Patient is wanting a copy of his sleep study done 10 years ago on a place on [address] in [place]. He states that it was done by a Dr Tamala Julian that has since retired and the place has closed down. I have been unable to find out any information.

## 2018-02-03 NOTE — Telephone Encounter (Signed)
I have asked our referral department for companies that do this and hopefully one that the insurance will cover.  We will be working on it. Most of the time PCP's don't do the studies, just reorder from the specialist's report.

## 2018-02-03 NOTE — Telephone Encounter (Signed)
I am not sure what to do about this! ?

## 2018-02-04 NOTE — Telephone Encounter (Signed)
[name], [place], patient called to inquire about his referral for home sleep study.  Informed him that we are in the process of having this set up for him.  I spoke with Jamelle Haring and she explained that she has sent the form needed to set this up to Particia Nearing for her signature.  When [place] receives this back she will work on scheduling this.  Patient reports he needs this done asap, he is using a loaner machine and is supposed to turn it in on Saturday.  [place] notified.

## 2018-02-04 NOTE — Telephone Encounter (Signed)
I have signed, though I doubt everything will be handled by Saturday

## 2018-02-05 NOTE — Telephone Encounter (Signed)
Pt aware paperwork & calls have been made to Dinah Beers with [facility] for the home sleep study

## 2018-02-12 DIAGNOSIS — G4730 Sleep apnea, unspecified: Secondary | ICD-10-CM | POA: Diagnosis not present

## 2018-02-12 NOTE — Telephone Encounter (Signed)
Aware per prior phone call.

## 2018-02-13 DIAGNOSIS — G4733 Obstructive sleep apnea (adult) (pediatric): Secondary | ICD-10-CM | POA: Diagnosis not present

## 2018-03-04 ENCOUNTER — Other Ambulatory Visit: Payer: Self-pay | Admitting: Physician Assistant

## 2018-03-04 DIAGNOSIS — G4733 Obstructive sleep apnea (adult) (pediatric): Secondary | ICD-10-CM

## 2018-03-04 DIAGNOSIS — G609 Hereditary and idiopathic neuropathy, unspecified: Secondary | ICD-10-CM | POA: Diagnosis not present

## 2018-03-04 DIAGNOSIS — L97511 Non-pressure chronic ulcer of other part of right foot limited to breakdown of skin: Secondary | ICD-10-CM | POA: Diagnosis not present

## 2018-03-13 ENCOUNTER — Other Ambulatory Visit: Payer: Self-pay | Admitting: Physician Assistant

## 2018-03-13 DIAGNOSIS — L97511 Non-pressure chronic ulcer of other part of right foot limited to breakdown of skin: Secondary | ICD-10-CM | POA: Diagnosis not present

## 2018-03-13 DIAGNOSIS — G609 Hereditary and idiopathic neuropathy, unspecified: Secondary | ICD-10-CM | POA: Diagnosis not present

## 2018-03-17 DIAGNOSIS — G4733 Obstructive sleep apnea (adult) (pediatric): Secondary | ICD-10-CM | POA: Diagnosis not present

## 2018-04-01 DIAGNOSIS — G609 Hereditary and idiopathic neuropathy, unspecified: Secondary | ICD-10-CM | POA: Diagnosis not present

## 2018-04-01 DIAGNOSIS — L97511 Non-pressure chronic ulcer of other part of right foot limited to breakdown of skin: Secondary | ICD-10-CM | POA: Diagnosis not present

## 2018-04-17 DIAGNOSIS — G4733 Obstructive sleep apnea (adult) (pediatric): Secondary | ICD-10-CM | POA: Diagnosis not present

## 2018-05-06 ENCOUNTER — Other Ambulatory Visit: Payer: Self-pay | Admitting: Physician Assistant

## 2018-05-13 DIAGNOSIS — L84 Corns and callosities: Secondary | ICD-10-CM | POA: Diagnosis not present

## 2018-05-13 DIAGNOSIS — G609 Hereditary and idiopathic neuropathy, unspecified: Secondary | ICD-10-CM | POA: Diagnosis not present

## 2018-05-14 DIAGNOSIS — G4733 Obstructive sleep apnea (adult) (pediatric): Secondary | ICD-10-CM | POA: Diagnosis not present

## 2018-05-16 ENCOUNTER — Encounter: Payer: Self-pay | Admitting: *Deleted

## 2018-05-17 DIAGNOSIS — G4733 Obstructive sleep apnea (adult) (pediatric): Secondary | ICD-10-CM | POA: Diagnosis not present

## 2018-06-17 DIAGNOSIS — G4733 Obstructive sleep apnea (adult) (pediatric): Secondary | ICD-10-CM | POA: Diagnosis not present

## 2018-07-17 DIAGNOSIS — G4733 Obstructive sleep apnea (adult) (pediatric): Secondary | ICD-10-CM | POA: Diagnosis not present

## 2018-07-28 ENCOUNTER — Ambulatory Visit: Payer: Medicare HMO | Admitting: Physician Assistant

## 2018-07-29 DIAGNOSIS — G609 Hereditary and idiopathic neuropathy, unspecified: Secondary | ICD-10-CM | POA: Diagnosis not present

## 2018-07-29 DIAGNOSIS — L84 Corns and callosities: Secondary | ICD-10-CM | POA: Diagnosis not present

## 2018-08-01 ENCOUNTER — Other Ambulatory Visit: Payer: Self-pay | Admitting: Physician Assistant

## 2018-08-04 ENCOUNTER — Ambulatory Visit (INDEPENDENT_AMBULATORY_CARE_PROVIDER_SITE_OTHER): Payer: Medicare HMO | Admitting: Physician Assistant

## 2018-08-04 ENCOUNTER — Encounter: Payer: Self-pay | Admitting: Physician Assistant

## 2018-08-04 VITALS — BP 114/56 | HR 69 | Temp 97.6°F | Ht 71.0 in | Wt 231.4 lb

## 2018-08-04 DIAGNOSIS — G4733 Obstructive sleep apnea (adult) (pediatric): Secondary | ICD-10-CM

## 2018-08-04 DIAGNOSIS — I10 Essential (primary) hypertension: Secondary | ICD-10-CM | POA: Diagnosis not present

## 2018-08-04 DIAGNOSIS — E782 Mixed hyperlipidemia: Secondary | ICD-10-CM

## 2018-08-04 DIAGNOSIS — K760 Fatty (change of) liver, not elsewhere classified: Secondary | ICD-10-CM

## 2018-08-04 DIAGNOSIS — D696 Thrombocytopenia, unspecified: Secondary | ICD-10-CM

## 2018-08-04 DIAGNOSIS — R7309 Other abnormal glucose: Secondary | ICD-10-CM

## 2018-08-04 LAB — BAYER DCA HB A1C WAIVED: HB A1C (BAYER DCA - WAIVED): 5.9 % (ref <7.0)

## 2018-08-04 MED ORDER — LISINOPRIL 40 MG PO TABS: 40.0000 mg | ORAL_TABLET | Freq: Every day | ORAL | 5 refills | Status: DC

## 2018-08-04 MED ORDER — METFORMIN HCL 500 MG PO TABS: 500.0000 mg | ORAL_TABLET | Freq: Every day | ORAL | 3 refills | Status: DC

## 2018-08-04 MED ORDER — METFORMIN HCL 500 MG PO TABS: 500.0000 mg | ORAL_TABLET | Freq: Every day | ORAL | 6 refills | Status: DC

## 2018-08-04 MED ORDER — LISINOPRIL 40 MG PO TABS: 40.0000 mg | ORAL_TABLET | Freq: Every day | ORAL | 3 refills | Status: DC

## 2018-08-05 LAB — CBC WITH DIFFERENTIAL/PLATELET
BASOS ABS: 0 10*3/uL (ref 0.0–0.2)
Basos: 1 %
EOS (ABSOLUTE): 0.1 10*3/uL (ref 0.0–0.4)
EOS: 2 %
HEMATOCRIT: 37.9 % (ref 37.5–51.0)
HEMOGLOBIN: 13.7 g/dL (ref 13.0–17.7)
IMMATURE GRANULOCYTES: 0 %
Immature Grans (Abs): 0 10*3/uL (ref 0.0–0.1)
LYMPHS: 20 %
Lymphocytes Absolute: 1.2 10*3/uL (ref 0.7–3.1)
MCH: 36.5 pg — ABNORMAL HIGH (ref 26.6–33.0)
MCHC: 36.1 g/dL — ABNORMAL HIGH (ref 31.5–35.7)
MCV: 101 fL — AB (ref 79–97)
MONOCYTES: 10 %
Monocytes Absolute: 0.6 10*3/uL (ref 0.1–0.9)
NEUTROS PCT: 67 %
Neutrophils Absolute: 4.1 10*3/uL (ref 1.4–7.0)
Platelets: 82 10*3/uL — CL (ref 150–450)
RBC: 3.75 x10E6/uL — AB (ref 4.14–5.80)
RDW: 11.8 % (ref 11.6–15.4)
WBC: 6.1 10*3/uL (ref 3.4–10.8)

## 2018-08-05 LAB — CMP14+EGFR
ALBUMIN: 3.5 g/dL (ref 3.5–4.8)
ALK PHOS: 145 IU/L — AB (ref 39–117)
ALT: 26 IU/L (ref 0–44)
AST: 60 IU/L — ABNORMAL HIGH (ref 0–40)
Albumin/Globulin Ratio: 1.3 (ref 1.2–2.2)
BILIRUBIN TOTAL: 1.7 mg/dL — AB (ref 0.0–1.2)
BUN/Creatinine Ratio: 10 (ref 10–24)
BUN: 8 mg/dL (ref 8–27)
CHLORIDE: 100 mmol/L (ref 96–106)
CO2: 25 mmol/L (ref 20–29)
Calcium: 9.4 mg/dL (ref 8.6–10.2)
Creatinine, Ser: 0.83 mg/dL (ref 0.76–1.27)
GFR calc Af Amer: 99 mL/min/{1.73_m2} (ref >59)
GFR calc non Af Amer: 85 mL/min/{1.73_m2} (ref >59)
GLUCOSE: 151 mg/dL — AB (ref 65–99)
Globulin, Total: 2.8 g/dL (ref 1.5–4.5)
Potassium: 3.9 mmol/L (ref 3.5–5.2)
Sodium: 138 mmol/L (ref 134–144)
Total Protein: 6.3 g/dL (ref 6.0–8.5)

## 2018-08-05 LAB — LIPID PANEL
CHOL/HDL RATIO: 2.2 ratio (ref 0.0–5.0)
Cholesterol, Total: 138 mg/dL (ref 100–199)
HDL: 63 mg/dL (ref >39)
LDL CALC: 62 mg/dL (ref 0–99)
TRIGLYCERIDES: 63 mg/dL (ref 0–149)
VLDL CHOLESTEROL CAL: 13 mg/dL (ref 5–40)

## 2018-08-07 ENCOUNTER — Other Ambulatory Visit: Payer: Self-pay | Admitting: *Deleted

## 2018-08-07 ENCOUNTER — Telehealth: Payer: Self-pay | Admitting: Physician Assistant

## 2018-08-07 DIAGNOSIS — D696 Thrombocytopenia, unspecified: Secondary | ICD-10-CM | POA: Insufficient documentation

## 2018-08-07 NOTE — Telephone Encounter (Signed)
Patient aware of labs he states he has appointment with hematology at the [facility] on the 16th and will take copy with him.

## 2018-08-07 NOTE — Progress Notes (Signed)
BP (!) 114/56   Pulse 69   Temp 97.6 F (36.4 C) (Oral)   Ht '5\' 11"'$  (1.803 m)   Wt 231 lb 6.4 oz (105 kg)   BMI 32.27 kg/m    Subjective:    Patient ID: [name], male    DOB: [birth year], 78 y.o.   MRN: [id number]  HPI: [name] is a 78 y.o. male presenting on 08/04/2018 for Hypertension (6 month follow up ) and Hyperlipidemia  This patient comes in for 45-monthrecheck on his chronic medical conditions.  They do include hypertension, hyperlipidemia, obstructive sleep apnea, fatty liver disease, thrombocytopenia.  [facility].  He still sees Dr. HPercival Spanishfor his coronary artery disease.  He does need labs today.  We will wait and see what his glucose readings are to see if a glucometer is needed or not.  He states that overall he is feeling fairly well.  The most positive something he feels was is the neuropathy in his feet.  Past Medical History:  Diagnosis Date  . 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 Diverticulosis   . Hepatomegaly   . Hx of colonic polyps   . Obesity, unspecified   . Obstructive sleep apnea    Uses CPAP.   .Marland KitchenOther and unspecified hyperlipidemia   . Unspecified essential hypertension    Relevant past medical, surgical, family and social history reviewed and updated as indicated. Interim medical history since our last visit reviewed. Allergies and medications reviewed and updated. DATA REVIEWED: CHART IN EPIC  Family History reviewed for pertinent findings.  Review of Systems  Constitutional: Negative.  Negative for appetite change and fatigue.  HENT: Negative.   Eyes: Negative.  Negative for pain and visual disturbance.  Respiratory: Negative.  Negative for cough, chest tightness, shortness of breath and wheezing.   Cardiovascular: Negative.  Negative for chest pain, palpitations and leg swelling.  Gastrointestinal: Negative.  Negative for abdominal pain,  diarrhea, nausea and vomiting.  Endocrine: Negative.   Genitourinary: Negative.   Musculoskeletal: Negative.   Skin: Negative.  Negative for color change and rash.  Neurological: Negative.  Negative for weakness, numbness and headaches.  Psychiatric/Behavioral: Negative.     Allergies as of 08/04/2018   No Known Allergies     Medication List       Accurate as of August 04, 2018 11:59 PM. Always use your most recent med list.        aspirin 81 MG tablet Take 81 mg by mouth daily.   atorvastatin 40 MG tablet Commonly known as:  LIPITOR TAKE 1 TABLET 2 TIMES A WEEK   CENTRUM SILVER PO Take 1 capsule by mouth daily.   cyanocobalamin 1000 MCG tablet Take 1,000 mcg by mouth daily.   lisinopril 40 MG tablet Commonly known as:  PRINIVIL,ZESTRIL Take 1 tablet (40 mg total) by mouth daily.   metFORMIN 500 MG tablet Commonly known as:  GLUCOPHAGE Take 1 tablet (500 mg total) by mouth daily with breakfast.   nitroGLYCERIN 0.4 MG SL tablet Commonly known as:  NITROSTAT DISSOLVE 1 TABLET UNDER TONGUE AS NEEDED FOR CHEST PAIN   psyllium 58.6 % packet Commonly known as:  METAMUCIL Take 1 packet by mouth daily.          Objective:    BP (!) 114/56   Pulse 69   Temp 97.6 F (36.4 C) (Oral)  Ht '5\' 11"'$  (1.803 m)   Wt 231 lb 6.4 oz (105 kg)   BMI 32.27 kg/m   No Known Allergies  Wt Readings from Last 3 Encounters:  08/04/18 231 lb 6.4 oz (105 kg)  01/21/18 232 lb 6.4 oz (105.4 kg)  09/04/17 235 lb (106.6 kg)    Physical Exam Constitutional:      Appearance: He is well-developed.  HENT:     Head: Normocephalic and atraumatic.  Eyes:     Conjunctiva/sclera: Conjunctivae normal.     Pupils: Pupils are equal, round, and reactive to light.  Neck:     Musculoskeletal: Normal range of motion and neck supple.  Cardiovascular:     Rate and Rhythm: Normal rate and regular rhythm.     Heart sounds: Normal heart sounds.  Pulmonary:     Effort: Pulmonary effort is  normal.     Breath sounds: Normal breath sounds.  Abdominal:     General: Bowel sounds are normal.     Palpations: Abdomen is soft.  Musculoskeletal: Normal range of motion.  Skin:    General: Skin is warm and dry.     Results for orders placed or performed in visit on 08/04/18  CMP14+EGFR  Result Value Ref Range   Glucose 151 (H) 65 - 99 mg/dL   BUN 8 8 - 27 mg/dL   Creatinine, Ser 0.83 0.76 - 1.27 mg/dL   GFR calc non Af Amer 85 >59 mL/min/1.73   GFR calc Af Amer 99 >59 mL/min/1.73   BUN/Creatinine Ratio 10 10 - 24   Sodium 138 134 - 144 mmol/L   Potassium 3.9 3.5 - 5.2 mmol/L   Chloride 100 96 - 106 mmol/L   CO2 25 20 - 29 mmol/L   Calcium 9.4 8.6 - 10.2 mg/dL   Total Protein 6.3 6.0 - 8.5 g/dL   Albumin 3.5 3.5 - 4.8 g/dL   Globulin, Total 2.8 1.5 - 4.5 g/dL   Albumin/Globulin Ratio 1.3 1.2 - 2.2   Bilirubin Total 1.7 (H) 0.0 - 1.2 mg/dL   Alkaline Phosphatase 145 (H) 39 - 117 IU/L   AST 60 (H) 0 - 40 IU/L   ALT 26 0 - 44 IU/L  CBC with Differential/Platelet  Result Value Ref Range   WBC 6.1 3.4 - 10.8 x10E3/uL   RBC 3.75 (L) 4.14 - 5.80 x10E6/uL   Hemoglobin 13.7 13.0 - 17.7 g/dL   Hematocrit 37.9 37.5 - 51.0 %   MCV 101 (H) 79 - 97 fL   MCH 36.5 (H) 26.6 - 33.0 pg   MCHC 36.1 (H) 31.5 - 35.7 g/dL   RDW 11.8 11.6 - 15.4 %   Platelets 82 (LL) 150 - 450 x10E3/uL   Neutrophils 67 Not Estab. %   Lymphs 20 Not Estab. %   Monocytes 10 Not Estab. %   Eos 2 Not Estab. %   Basos 1 Not Estab. %   Neutrophils Absolute 4.1 1.4 - 7.0 x10E3/uL   Lymphocytes Absolute 1.2 0.7 - 3.1 x10E3/uL   Monocytes Absolute 0.6 0.1 - 0.9 x10E3/uL   EOS (ABSOLUTE) 0.1 0.0 - 0.4 x10E3/uL   Basophils Absolute 0.0 0.0 - 0.2 x10E3/uL   Immature Granulocytes 0 Not Estab. %   Immature Grans (Abs) 0.0 0.0 - 0.1 x10E3/uL   Hematology Comments: Note:   Lipid panel  Result Value Ref Range   Cholesterol, Total 138 100 - 199 mg/dL   Triglycerides 63 0 - 149 mg/dL   HDL 63 >39 mg/dL  VLDL  Cholesterol Cal 13 5 - 40 mg/dL   LDL Calculated 62 0 - 99 mg/dL   Chol/HDL Ratio 2.2 0.0 - 5.0 ratio  Bayer DCA Hb A1c Waived  Result Value Ref Range   HB A1C (BAYER DCA - WAIVED) 5.9 <7.0 %      Assessment & Plan:   1. Essential hypertension - CMP14+EGFR - lisinopril (PRINIVIL,ZESTRIL) 40 MG tablet; Take 1 tablet (40 mg total) by mouth daily.  Dispense: 30 tablet; Refill: 5  2. Mixed hyperlipidemia - CMP14+EGFR - CBC with Differential/Platelet - Lipid panel  3. Elevated glucose - CMP14+EGFR - CBC with Differential/Platelet - Bayer DCA Hb A1c Waived - metFORMIN (GLUCOPHAGE) 500 MG tablet; Take 1 tablet (500 mg total) by mouth daily with breakfast.  Dispense: 30 tablet; Refill: 6  4. SLEEP APNEA, OBSTRUCTIVE Continue with CPAP machine only, symptoms well controlled  5. Fatty liver disease, nonalcoholic - GXQ11+HERD  6. Thrombocytopenia (HCC) - CBC with Differential/Platelet   Continue all other maintenance medications as listed above.  Follow up plan: Return in about 6 months (around 02/02/2019).  Educational handout given for [place] PA-C [place] [address]  [place], [place] [address] [phone]   08/07/2018, 8:50 PM

## 2018-08-17 DIAGNOSIS — G4733 Obstructive sleep apnea (adult) (pediatric): Secondary | ICD-10-CM | POA: Diagnosis not present

## 2018-08-21 DIAGNOSIS — G4733 Obstructive sleep apnea (adult) (pediatric): Secondary | ICD-10-CM | POA: Diagnosis not present

## 2018-09-02 NOTE — Progress Notes (Signed)
Cardiology Office Note   Date:  09/03/2018   ID:  [name], DOB 1941/03/03, MRN [id number]  PCP:  [name], PA-C  Cardiologist:   Minus Breeding, MD  Referring:  Particia Nearing   Chief Complaint  Patient presents with  . Coronary Artery Disease     History of Present Illness: [name] is a 78 y.o. male who presents for follow up of CAD. He had a DES to the LAD in 2/08. Repeat cath in 11/11 showed patent stent.  Echo in 2/16 showed EF 55-60%, no significant valvular abnormalities.   He returns for follow up.   He has been doing well.  He still goes to the [facility].  Today he is in atrial fibrillation and he does not feel this.  He does not notice any palpitations.  He has no presyncope or syncope.  Is not been having any chest pressure.  He denies shortness of breath, PND or orthopnea.  He has had no weight gain or edema.   Past Medical History:  Diagnosis Date  . 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 Diverticulosis   . Hepatomegaly   . Hx of colonic polyps   . Obesity, unspecified   . Obstructive sleep apnea    Uses CPAP.   Marland Kitchen Other and unspecified hyperlipidemia   . Unspecified essential hypertension     Past Surgical History:  Procedure Laterality Date  . COLONOSCOPY  9/17/20012  . CORONARY STENT PLACEMENT  08/2006   LAD  . POLYPECTOMY  09/17/20012  . TONSILLECTOMY       Current Outpatient Medications  Medication Sig Dispense Refill  . atorvastatin (LIPITOR) 40 MG tablet TAKE 1 TABLET 2 TIMES A WEEK 24 tablet 1  . cyanocobalamin 1000 MCG tablet Take 1,000 mcg by mouth daily.    Marland Kitchen lisinopril (PRINIVIL,ZESTRIL) 40 MG tablet Take 1 tablet (40 mg total) by mouth daily. 30 tablet 5  . metFORMIN (GLUCOPHAGE) 500 MG tablet Take 1 tablet (500 mg total) by mouth daily with breakfast. 30 tablet 6  . Multiple Vitamins-Minerals (CENTRUM SILVER PO) Take 1 capsule by mouth daily.      . nitroGLYCERIN (NITROSTAT) 0.4 MG SL tablet  DISSOLVE 1 TABLET UNDER TONGUE AS NEEDED FOR CHEST PAIN 25 tablet 2  . psyllium (METAMUCIL) 58.6 % packet Take 1 packet by mouth daily.    . rivaroxaban (XARELTO) 20 MG TABS tablet Take 1 tablet (20 mg total) by mouth daily with supper. 30 tablet 11   No current facility-administered medications for this visit.     Allergies:   Patient has no known allergies.    ROS:  Please see the history of present illness.   Otherwise, review of systems are positive for none.   All other systems are reviewed and negative.    PHYSICAL EXAM: VS:  BP 100/60   Pulse 98   Ht 5\' 11"  (1.803 m)   Wt 226 lb (102.5 kg)   BMI 31.52 kg/m  , BMI Body mass index is 31.52 kg/m.  GENERAL:  Well appearing NECK:  No jugular venous distention, waveform within normal limits, carotid upstroke brisk and symmetric, no bruits, no thyromegaly LUNGS:  Clear to auscultation bilaterally CHEST:  Unremarkable HEART:  PMI not displaced or sustained,S1 and S2 within normal limits, no S3,  no clicks, no rubs, no murmurs, irregular ABD:  Flat, positive bowel sounds normal in frequency in pitch, no bruits, no rebound, no  guarding, no midline pulsatile mass, no hepatomegaly, no splenomegaly EXT:  2 plus pulses throughout, no edema, no cyanosis no clubbing    EKG:  EKG is  ordered today. The ekg ordered today demonstrates atrial fib, rate 98, PVCs, axis within normal limits, intervals within normal limits, no acute ST-T wave changes.   Recent Labs: 08/04/2018: ALT 26; BUN 8; Creatinine, Ser 0.83; Hemoglobin 13.7; Platelets 82; Potassium 3.9; Sodium 138    Lipid Panel    Component Value Date/Time   CHOL 138 08/04/2018 0829   CHOL 138 11/06/2012 0945   TRIG 63 08/04/2018 0829   TRIG 108 07/07/2013 0952   TRIG 98 11/06/2012 0945   HDL 63 08/04/2018 0829   HDL 45 07/07/2013 0952   HDL 42 11/06/2012 0945   CHOLHDL 2.2 08/04/2018 0829   CHOLHDL 3 09/09/2014 1451   VLDL 24.6 09/09/2014 1451   LDLCALC 62 08/04/2018 0829    LDLCALC 74 07/07/2013 0952   LDLCALC 76 11/06/2012 0945      Wt Readings from Last 3 Encounters:  09/03/18 226 lb (102.5 kg)  08/04/18 231 lb 6.4 oz (105 kg)  01/21/18 232 lb 6.4 oz (105.4 kg)      Other studies Reviewed: Additional studies/ records that were reviewed today include: Labs Review of the above records demonstrates:  See below  ASSESSMENT AND PLAN:   CAD:  The patient has no new sypmtoms.  No further cardiovascular testing is indicated.  We will continue with aggressive risk reduction and meds as listed.  DYSLIPIDEMIA:   LDL was 62 with an HDL of 63.  No change in medications.  ATRIAL FIB: This is new onset.  Previously had ectopy.  He has no contraindications to anticoagulation.  I have looked at his creatinine and his recent CBC.  He will start Xarelto 20 mg daily.  He can get a 24-hour Holter to judge rate control.  Since he is asymptomatic I am planning rate control and anticoagulation.  Is been to stop his aspirin.    Current medicines are reviewed at length with the patient today.  The patient does not have concerns regarding medicines.  The following changes have been made:    As above  Labs/ tests ordered today include:      Orders Placed This Encounter  Procedures  . HOLTER MONITOR - 24 HOUR  . EKG 12-Lead     Disposition:   FU with me in 6 weeks.   Signed, Minus Breeding, MD  09/03/2018 3:50 PM    [facility]

## 2018-09-03 ENCOUNTER — Encounter: Payer: Self-pay | Admitting: Cardiology

## 2018-09-03 ENCOUNTER — Ambulatory Visit: Payer: Medicare HMO | Admitting: Cardiology

## 2018-09-03 VITALS — BP 100/60 | HR 98 | Ht 71.0 in | Wt 226.0 lb

## 2018-09-03 DIAGNOSIS — I10 Essential (primary) hypertension: Secondary | ICD-10-CM | POA: Diagnosis not present

## 2018-09-03 DIAGNOSIS — E785 Hyperlipidemia, unspecified: Secondary | ICD-10-CM

## 2018-09-03 DIAGNOSIS — I2510 Atherosclerotic heart disease of native coronary artery without angina pectoris: Secondary | ICD-10-CM | POA: Diagnosis not present

## 2018-09-03 DIAGNOSIS — I4891 Unspecified atrial fibrillation: Secondary | ICD-10-CM | POA: Diagnosis not present

## 2018-09-03 MED ORDER — RIVAROXABAN 20 MG PO TABS: 20.0000 mg | ORAL_TABLET | Freq: Every day | ORAL | 11 refills | Status: DC

## 2018-09-03 NOTE — Patient Instructions (Signed)
Medication Instructions:  Please discontinue your Asprin and start Xarelto 20 mg daily. Continue all other medications as listed.  If you need a refill on your cardiac medications before your next appointment, please call your pharmacy.   Testing/Procedures: Your physician has recommended that you wear a holter monitor. Holter monitors are medical devices that record the heart's electrical activity. Doctors most often use these monitors to diagnose arrhythmias. Arrhythmias are problems with the speed or rhythm of the heartbeat. The monitor is a small, portable device. You can wear one while you do your normal daily activities. This is usually used to diagnose what is causing palpitations/syncope (passing out).  Follow-Up: Follow up with Dr Percival Spanish in 6 to 8 weeks in [place].  Thank you for choosing [facility]!!

## 2018-09-15 ENCOUNTER — Encounter (INDEPENDENT_AMBULATORY_CARE_PROVIDER_SITE_OTHER): Payer: Medicare HMO

## 2018-09-15 DIAGNOSIS — I4891 Unspecified atrial fibrillation: Secondary | ICD-10-CM | POA: Diagnosis not present

## 2018-09-15 DIAGNOSIS — I2510 Atherosclerotic heart disease of native coronary artery without angina pectoris: Secondary | ICD-10-CM | POA: Diagnosis not present

## 2018-09-17 DIAGNOSIS — G4733 Obstructive sleep apnea (adult) (pediatric): Secondary | ICD-10-CM | POA: Diagnosis not present

## 2018-10-03 ENCOUNTER — Telehealth: Payer: Self-pay | Admitting: Cardiology

## 2018-10-03 NOTE — Telephone Encounter (Signed)
Looks like it was placed at [facility] and I cannot see the actual tracings.  Did not come to my inbox as far as I know.  Please call [facility] and have it sent to me for review.

## 2018-10-03 NOTE — Telephone Encounter (Signed)
Holter results uploaded to Dr. Percival Spanish for review.

## 2018-10-03 NOTE — Telephone Encounter (Signed)
I spoke to the patient who is awaiting his holter monitor results placed early February.  Please advise, thank you.

## 2018-10-03 NOTE — Telephone Encounter (Signed)
Follow Up:      Pt would his Holterr monitor results please.

## 2018-10-07 NOTE — Telephone Encounter (Signed)
Reviewed results of monitor with patient who states understanding.  He is concerned the cost of Xarelto and will discuss with Dr Percival Spanish at his upcoming scheduled appt 10/29/2018.

## 2018-10-07 NOTE — Telephone Encounter (Signed)
Notes recorded by Minus Breeding, MD on 10/05/2018 at 4:05 PM EDT Atrial fib is persistent but the rate is controlled. No change in therapy. Continue with current meds and anticoagulation.

## 2018-10-13 ENCOUNTER — Other Ambulatory Visit: Payer: Self-pay | Admitting: Physician Assistant

## 2018-10-16 DIAGNOSIS — G4733 Obstructive sleep apnea (adult) (pediatric): Secondary | ICD-10-CM | POA: Diagnosis not present

## 2018-10-29 ENCOUNTER — Ambulatory Visit: Payer: Medicare HMO | Admitting: Cardiology

## 2018-11-16 DIAGNOSIS — G4733 Obstructive sleep apnea (adult) (pediatric): Secondary | ICD-10-CM | POA: Diagnosis not present

## 2018-12-16 DIAGNOSIS — G4733 Obstructive sleep apnea (adult) (pediatric): Secondary | ICD-10-CM | POA: Diagnosis not present

## 2018-12-16 NOTE — Progress Notes (Signed)
Virtual Visit via Telephone Note   This visit type was conducted due to national recommendations for restrictions regarding the COVID-19 Pandemic (e.g. social distancing) in an effort to limit this patient's exposure and mitigate transmission in our community.  Due to his co-morbid illnesses, this patient is at least at moderate risk for complications without adequate follow up.  This format is felt to be most appropriate for this patient at this time.  The patient did not have access to video technology/had technical difficulties with video requiring transitioning to audio format only (telephone).  All issues noted in this document were discussed and addressed.  No physical exam could be performed with this format.  Please refer to the patient's chart for his  consent to telehealth for [facility].   Date:  12/17/2018   ID:  [name], DOB 12-Jan-1941, MRN [id number]  Patient Location: Home Provider Location: Home  PCP:  [name], PA-C  Cardiologist:  Dr. Percival Spanish Electrophysiologist:  None   Evaluation Performed:  Follow-Up Visit  Chief Complaint:  Leg swelling  History of Present Illness:    [name] is a 78 y.o. male who presents for follow up of CAD. He had a DES to the LAD in 2/08. Repeat cath in 11/11 showed patent stent. Echo in 2/16 showed EF 55-60%, no significant valvular abnormalities.   He returns for follow up.   He has done well from a cardiovascular standpoint.  He does not describe palpitations, presyncope or syncope.  He said no weight gain.  However, he has had some ankle edema.  He denies any new shortness of breath, PND or orthopnea.  He does drink a lot of water because he was told to flush out his kidneys in the past but he watches his salt.  He is waiting on compression stockings from the [place].  He tolerates anticoagulation.  The patient does not have symptoms concerning for COVID-19 infection (fever, chills, cough, or new shortness of breath).     Past Medical History:  Diagnosis Date  . 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 Diverticulosis   . Hepatomegaly   . Hx of colonic polyps   . Obesity, unspecified   . Obstructive sleep apnea    Uses CPAP.   Marland Kitchen Other and unspecified hyperlipidemia   . Unspecified essential hypertension    Past Surgical History:  Procedure Laterality Date  . COLONOSCOPY  9/17/20012  . CORONARY STENT PLACEMENT  08/2006   LAD  . POLYPECTOMY  09/17/20012  . TONSILLECTOMY       Current Meds  Medication Sig  . atorvastatin (LIPITOR) 40 MG tablet TAKE 1 TABLET 2 TIMES A WEEK  . cyanocobalamin 1000 MCG tablet Take 1,000 mcg by mouth daily.  Marland Kitchen lisinopril (PRINIVIL,ZESTRIL) 40 MG tablet Take 1 tablet (40 mg total) by mouth daily.  . metFORMIN (GLUCOPHAGE) 500 MG tablet Take 1 tablet (500 mg total) by mouth daily with breakfast.  . Multiple Vitamins-Minerals (CENTRUM SILVER PO) Take 1 capsule by mouth daily.    . nitroGLYCERIN (NITROSTAT) 0.4 MG SL tablet DISSOLVE 1 TABLET UNDER TONGUE AS NEEDED FOR CHEST PAIN  . psyllium (METAMUCIL) 58.6 % packet Take 1 packet by mouth daily.  . rivaroxaban (XARELTO) 20 MG TABS tablet Take 1 tablet (20 mg total) by mouth daily with supper.     Allergies:   Patient has no known allergies.   Social History   Tobacco Use  . Smoking  status: Former Smoker    Packs/day: 1.00    Types: Cigarettes    Last attempt to quit: 03/06/1971    Years since quitting: 47.8  . Smokeless tobacco: Former Systems developer    Types: [place] date: 07/30/1989  Substance Use Topics  . Alcohol use: No    Comment: 1 drink daily   . Drug use: No     Family Hx: The patient's family history includes Cancer in his mother; Heart disease in his father; Hypertension in his mother and sister; Irritable bowel syndrome in his maternal grandmother. There is no history of Colon cancer.  ROS:   Please see the history of present illness.    As stated in the HPI and  negative for all other systems.   Prior CV studies:   The following studies were reviewed today:    Labs/Other Tests and Data Reviewed:    EKG:  No ECG reviewed.  Recent Labs: 08/04/2018: ALT 26; BUN 8; Creatinine, Ser 0.83; Hemoglobin 13.7; Platelets 82; Potassium 3.9; Sodium 138   Recent Lipid Panel Lab Results  Component Value Date/Time   CHOL 138 08/04/2018 08:29 AM   CHOL 138 11/06/2012 09:45 AM   TRIG 63 08/04/2018 08:29 AM   TRIG 108 07/07/2013 09:52 AM   TRIG 98 11/06/2012 09:45 AM   HDL 63 08/04/2018 08:29 AM   HDL 45 07/07/2013 09:52 AM   HDL 42 11/06/2012 09:45 AM   CHOLHDL 2.2 08/04/2018 08:29 AM   CHOLHDL 3 09/09/2014 02:51 PM   LDLCALC 62 08/04/2018 08:29 AM   LDLCALC 74 07/07/2013 09:52 AM   LDLCALC 76 11/06/2012 09:45 AM    Wt Readings from Last 3 Encounters:  12/17/18 230 lb (104.3 kg)  09/03/18 226 lb (102.5 kg)  08/04/18 231 lb 6.4 oz (105 kg)     Objective:    Vital Signs:  Ht 5\' 11"  (1.803 m)   Wt 230 lb (104.3 kg)   BMI 32.08 kg/m    VITAL SIGNS:  reviewed  ASSESSMENT & PLAN:    CAD:  The patient has no new sypmtoms.  No further cardiovascular testing is indicated.  We will continue with aggressive risk reduction and meds as listed.  DYSLIPIDEMIA:   Per [name], PA-C  ATRIAL FIB:   He had a Holter with good rate control.   He will continue meds as listed.    EDEMA:  We talked about this and he is been to reduce his fluid intake.  He is awake with a compression stockings.  If he does not have improvement I might give him a low-dose of diuretic for a few days.  It is unlikely that this represents left-sided heart failure given the absence of any other symptoms.  COVID-19 Education: The signs and symptoms of COVID-19 were discussed with the patient and how to seek care for testing (follow up with PCP or arrange E-visit).  The importance of social distancing was discussed today.  Time:   Today, I have spent 16 minutes with the  patient with telehealth technology discussing the above problems.     Medication Adjustments/Labs and Tests Ordered: Current medicines are reviewed at length with the patient today.  Concerns regarding medicines are outlined above.   Tests Ordered: No orders of the defined types were placed in this encounter.   Medication Changes: No orders of the defined types were placed in this encounter.   Disposition:  Follow up with me in the office in 3  months   Signed, Minus Breeding, MD  12/17/2018 9:24 AM    [facility]

## 2018-12-17 ENCOUNTER — Telehealth (INDEPENDENT_AMBULATORY_CARE_PROVIDER_SITE_OTHER): Payer: Medicare HMO | Admitting: Cardiology

## 2018-12-17 ENCOUNTER — Encounter: Payer: Self-pay | Admitting: Cardiology

## 2018-12-17 VITALS — Ht 71.0 in | Wt 230.0 lb

## 2018-12-17 DIAGNOSIS — I4891 Unspecified atrial fibrillation: Secondary | ICD-10-CM

## 2018-12-17 DIAGNOSIS — M7989 Other specified soft tissue disorders: Secondary | ICD-10-CM

## 2018-12-17 DIAGNOSIS — I2510 Atherosclerotic heart disease of native coronary artery without angina pectoris: Secondary | ICD-10-CM | POA: Diagnosis not present

## 2018-12-17 DIAGNOSIS — Z7189 Other specified counseling: Secondary | ICD-10-CM

## 2018-12-17 DIAGNOSIS — E785 Hyperlipidemia, unspecified: Secondary | ICD-10-CM

## 2018-12-17 HISTORY — DX: Other specified counseling: Z71.89

## 2018-12-17 NOTE — Patient Instructions (Signed)
Medication Instructions:  The current medical regimen is effective;  continue present plan and medications.  If you need a refill on your cardiac medications before your next appointment, please call your pharmacy.   Follow-Up: Follow up in 3 months with Dr Percival Spanish in [place] as scheduled.  Thank you for choosing [facility]!!

## 2018-12-24 ENCOUNTER — Ambulatory Visit: Payer: Medicare HMO | Admitting: Cardiology

## 2018-12-29 ENCOUNTER — Ambulatory Visit (INDEPENDENT_AMBULATORY_CARE_PROVIDER_SITE_OTHER): Payer: Medicare HMO | Admitting: Physician Assistant

## 2018-12-29 ENCOUNTER — Other Ambulatory Visit: Payer: Self-pay

## 2018-12-29 DIAGNOSIS — L03119 Cellulitis of unspecified part of limb: Secondary | ICD-10-CM | POA: Diagnosis not present

## 2018-12-29 MED ORDER — CEPHALEXIN 500 MG PO CAPS: 500.0000 mg | ORAL_CAPSULE | Freq: Four times a day (QID) | ORAL | 0 refills | Status: DC

## 2018-12-30 ENCOUNTER — Encounter: Payer: Self-pay | Admitting: Physician Assistant

## 2018-12-30 NOTE — Progress Notes (Signed)
    Telephone visit  Subjective: [id number] PCP: Jared Sleeper, PA-C [name] is a 78 y.o. male calls for telephone consult today. Patient provides verbal consent for consult held via phone.  Patient is identified with 2 separate identifiers.  At this time the entire area is on COVID-19 social distancing and stay home orders are in place.  Patient is of higher risk and therefore we are performing this by a virtual method.  Location of patient: home Location of provider: HOME Others present for call: no  The patient has had difficult swelling in his lower legs.  It has become red.  And is moving from his feet up.  He denies any blisters or breaks in the skin.  He has had an issue with edema in the past.  He also notes that he has had on order compression stockings from the [place].  However they have not arrived yet.   ROS: Per HPI  No Known Allergies Past Medical History:  Diagnosis Date  . 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 Diverticulosis   . Hepatomegaly   . Hx of colonic polyps   . Obesity, unspecified   . Obstructive sleep apnea    Uses CPAP.   Marland Kitchen Other and unspecified hyperlipidemia   . Unspecified essential hypertension     Current Outpatient Medications:  .  atorvastatin (LIPITOR) 40 MG tablet, TAKE 1 TABLET 2 TIMES A WEEK, Disp: 24 tablet, Rfl: 0 .  cephALEXin (KEFLEX) 500 MG capsule, Take 1 capsule (500 mg total) by mouth 4 (four) times daily., Disp: 40 capsule, Rfl: 0 .  cyanocobalamin 1000 MCG tablet, Take 1,000 mcg by mouth daily., Disp: , Rfl:  .  lisinopril (PRINIVIL,ZESTRIL) 40 MG tablet, Take 1 tablet (40 mg total) by mouth daily., Disp: 30 tablet, Rfl: 5 .  metFORMIN (GLUCOPHAGE) 500 MG tablet, Take 1 tablet (500 mg total) by mouth daily with breakfast., Disp: 30 tablet, Rfl: 6 .  Multiple Vitamins-Minerals (CENTRUM SILVER PO), Take 1 capsule by mouth daily.  , Disp: , Rfl:  .  nitroGLYCERIN (NITROSTAT) 0.4 MG  SL tablet, DISSOLVE 1 TABLET UNDER TONGUE AS NEEDED FOR CHEST PAIN, Disp: 25 tablet, Rfl: 2 .  psyllium (METAMUCIL) 58.6 % packet, Take 1 packet by mouth daily., Disp: , Rfl:  .  rivaroxaban (XARELTO) 20 MG TABS tablet, Take 1 tablet (20 mg total) by mouth daily with supper., Disp: 30 tablet, Rfl: 11  Assessment/ Plan: 78 y.o. male   1. Cellulitis of lower extremity, unspecified laterality - cephALEXin (KEFLEX) 500 MG capsule; Take 1 capsule (500 mg total) by mouth 4 (four) times daily.  Dispense: 40 capsule; Refill: 0   Start time: 10:31 AM End time: 10:40 AM  Meds ordered this encounter  Medications  . cephALEXin (KEFLEX) 500 MG capsule    Sig: Take 1 capsule (500 mg total) by mouth 4 (four) times daily.    Dispense:  40 capsule    Refill:  0    Order Specific Question:   Supervising Provider    Answer:   [name] [[id number]]    Jared Nearing PA-C [place] [phone]

## 2018-12-31 ENCOUNTER — Other Ambulatory Visit: Payer: Self-pay | Admitting: Physician Assistant

## 2019-01-09 ENCOUNTER — Other Ambulatory Visit: Payer: Self-pay | Admitting: Physician Assistant

## 2019-01-09 ENCOUNTER — Telehealth: Payer: Self-pay | Admitting: Physician Assistant

## 2019-01-09 DIAGNOSIS — L03119 Cellulitis of unspecified part of limb: Secondary | ICD-10-CM

## 2019-01-09 DIAGNOSIS — G4733 Obstructive sleep apnea (adult) (pediatric): Secondary | ICD-10-CM | POA: Diagnosis not present

## 2019-01-09 MED ORDER — CEPHALEXIN 500 MG PO CAPS: 500.0000 mg | ORAL_CAPSULE | Freq: Four times a day (QID) | ORAL | 0 refills | Status: DC

## 2019-01-09 NOTE — Telephone Encounter (Signed)
Patient states he seen [name] 6/1 for cellulitis and states it has improved but he still has some redness and is requesting a refill of Keflex. Please advise and send back to pools.

## 2019-01-09 NOTE — Telephone Encounter (Signed)
Patient aware.

## 2019-01-09 NOTE — Telephone Encounter (Signed)
Sent to [place]

## 2019-02-03 ENCOUNTER — Ambulatory Visit (INDEPENDENT_AMBULATORY_CARE_PROVIDER_SITE_OTHER): Payer: Medicare HMO | Admitting: Physician Assistant

## 2019-02-03 ENCOUNTER — Encounter: Payer: Self-pay | Admitting: Physician Assistant

## 2019-02-03 ENCOUNTER — Other Ambulatory Visit: Payer: Self-pay

## 2019-02-03 DIAGNOSIS — I4891 Unspecified atrial fibrillation: Secondary | ICD-10-CM

## 2019-02-03 DIAGNOSIS — D696 Thrombocytopenia, unspecified: Secondary | ICD-10-CM | POA: Diagnosis not present

## 2019-02-03 DIAGNOSIS — I10 Essential (primary) hypertension: Secondary | ICD-10-CM | POA: Diagnosis not present

## 2019-02-03 NOTE — Progress Notes (Signed)
     Telephone visit  Subjective: CC: Recheck on chronic conditions PCP: [name], PA-C FVC:[id number] [name] is a 78 y.o. male calls for telephone consult today. Patient provides verbal consent for consult held via phone.  Patient is identified with 2 separate identifiers.  At this time the entire area is on COVID-19 social distancing and stay home orders are in place.  Patient is of higher risk and therefore we are performing this by a virtual method.  Location of patient: Home Location of provider: [facility] Others present for call: No  This is for a revisit on the patient's chronic conditions.  He is followed by the [facility] for most of his chronic conditions.  They recently did an extensive set of blood work.  He will have that sent to [place].  He will be having an eye exam in September or October of this year.  He will be having a colonoscopy performed in August.  He does have a cardiology appointment in August.  He states that overall he has been doing very well with His hypertension and atrial fibrillation.  Also he has had good sugar readings and his A1c has been under very good control.  ROS: Per HPI  No Known Allergies Past Medical History:  Diagnosis Date  . 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 Diverticulosis   . Hepatomegaly   . Hx of colonic polyps   . Obesity, unspecified   . Obstructive sleep apnea    Uses CPAP.   Marland Kitchen Other and unspecified hyperlipidemia   . Unspecified essential hypertension     Current Outpatient Medications:  .  atorvastatin (LIPITOR) 40 MG tablet, TAKE 1 TABLET 2 TIMES A WEEK, Disp: 24 tablet, Rfl: 0 .  cyanocobalamin 1000 MCG tablet, Take 1,000 mcg by mouth daily., Disp: , Rfl:  .  lisinopril (PRINIVIL,ZESTRIL) 40 MG tablet, Take 1 tablet (40 mg total) by mouth daily., Disp: 30 tablet, Rfl: 5 .  metFORMIN (GLUCOPHAGE) 500 MG tablet, Take 1 tablet (500 mg total) by mouth daily with breakfast., Disp: 30 tablet, Rfl: 6  .  Multiple Vitamins-Minerals (CENTRUM SILVER PO), Take 1 capsule by mouth daily.  , Disp: , Rfl:  .  nitroGLYCERIN (NITROSTAT) 0.4 MG SL tablet, DISSOLVE 1 TABLET UNDER TONGUE AS NEEDED FOR CHEST PAIN, Disp: 25 tablet, Rfl: 2 .  psyllium (METAMUCIL) 58.6 % packet, Take 1 packet by mouth daily., Disp: , Rfl:  .  rivaroxaban (XARELTO) 20 MG TABS tablet, Take 1 tablet (20 mg total) by mouth daily with supper., Disp: 30 tablet, Rfl: 11  Assessment/ Plan: 78 y.o. male   1. Decreased platelet count ([place]) [facility]  2. Essential hypertension Continue medications  3. Atrial fibrillation, unspecified type [facility]) Continue cardiology on medications   Continue all other maintenance medications as listed above.  Start time: 7:55 AM End time: 8:07 AM  No orders of the defined types were placed in this encounter.   Particia Nearing PA-C [place] [phone]

## 2019-03-11 DIAGNOSIS — G4733 Obstructive sleep apnea (adult) (pediatric): Secondary | ICD-10-CM | POA: Diagnosis not present

## 2019-03-20 ENCOUNTER — Other Ambulatory Visit: Payer: Self-pay | Admitting: Physician Assistant

## 2019-03-24 NOTE — Progress Notes (Signed)
Cardiology Office Note   Date:  03/25/2019   ID:  [name], DOB 1941/01/12, MRN [id number]  PCP:  [name], PA-C  Cardiologist:   Minus Breeding, MD  Referring:  Particia Nearing   Chief Complaint  Patient presents with  . Atrial Fibrillation     History of Present Illness: [name] is a 78 y.o. male who presents for follow up of CAD. He had a DES to the LAD in 2/08. Repeat cath in 11/11 showed patent stent.  Echo in 2/16 showed EF 55-60%, no significant valvular abnormalities.    He returns for follow up.  Since I last saw him he has had no new cardiac complaints.  He does not feel his heart racing or skipping.  He does have chronic lower extremity swelling.  He does drink a lot of water to flush out his kidneys.  He wears compression stockings.  He has not been as active and has had some progressive leg weakness.  He is not describing any shortness of breath, PND or orthopnea.  He has had no presyncope or syncope.    Past Medical History:  Diagnosis Date  . 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 Diverticulosis   . Hepatomegaly   . Hx of colonic polyps   . Obesity, unspecified   . Obstructive sleep apnea    Uses CPAP.   Marland Kitchen Other and unspecified hyperlipidemia   . Unspecified essential hypertension     Past Surgical History:  Procedure Laterality Date  . COLONOSCOPY  9/17/20012  . CORONARY STENT PLACEMENT  08/2006   LAD  . POLYPECTOMY  09/17/20012  . TONSILLECTOMY       Current Outpatient Medications  Medication Sig Dispense Refill  . atorvastatin (LIPITOR) 40 MG tablet TAKE 1 TABLET 2 TIMES A WEEK 24 tablet 0  . clopidogrel (PLAVIX) 75 MG tablet Take 75 mg by mouth daily.    . cyanocobalamin 1000 MCG tablet Take 1,000 mcg by mouth daily.    Marland Kitchen lisinopril (PRINIVIL,ZESTRIL) 40 MG tablet Take 1 tablet (40 mg total) by mouth daily. 30 tablet 5  . Multiple Vitamins-Minerals (CENTRUM SILVER PO) Take 1 capsule by mouth  daily.      . nitroGLYCERIN (NITROSTAT) 0.4 MG SL tablet DISSOLVE 1 TABLET UNDER TONGUE AS NEEDED FOR CHEST PAIN 25 tablet 2  . psyllium (METAMUCIL) 58.6 % packet Take 1 packet by mouth daily.    . rivaroxaban (XARELTO) 20 MG TABS tablet Take 1 tablet (20 mg total) by mouth daily with supper. 30 tablet 11  . metoprolol succinate (TOPROL-XL) 50 MG 24 hr tablet Take 1 tablet (50 mg total) by mouth daily. Take with or immediately following a meal. 90 tablet 3   No current facility-administered medications for this visit.     Allergies:   Patient has no known allergies.    ROS:  Please see the history of present illness.   Otherwise, review of systems are positive for none.   All other systems are reviewed and negative.    PHYSICAL EXAM: VS:  BP 138/80   Pulse (!) 117   Ht 5\' 11"  (1.803 m)   Wt 232 lb (105.2 kg)   BMI 32.36 kg/m  , BMI Body mass index is 32.36 kg/m.  GENERAL:  Well appearing NECK:  No jugular venous distention, waveform within normal limits, carotid upstroke brisk and symmetric, no bruits, no thyromegaly LUNGS:  Clear to auscultation bilaterally  CHEST:  Unremarkable HEART:  PMI not displaced or sustained,S1 and S2 within normal limits, no S3, no clicks, no rubs, no murmurs, irregular ABD:  Flat, positive bowel sounds normal in frequency in pitch, no bruits, no rebound, no guarding, no midline pulsatile mass, no hepatomegaly, no splenomegaly EXT:  2 plus pulses throughout, moderate bilateral edema, no cyanosis no clubbing   EKG:  EKG is   ordered today. The ekg ordered today demonstrates atrial fib, rate 117, PVCs, axis within normal limits, intervals within normal limits, no acute ST-T wave changes.   Recent Labs: 08/04/2018: ALT 26; BUN 8; Creatinine, Ser 0.83; Hemoglobin 13.7; Platelets 82; Potassium 3.9; Sodium 138    Lipid Panel    Component Value Date/Time   CHOL 138 08/04/2018 0829   CHOL 138 11/06/2012 0945   TRIG 63 08/04/2018 0829   TRIG 108  07/07/2013 0952   TRIG 98 11/06/2012 0945   HDL 63 08/04/2018 0829   HDL 45 07/07/2013 0952   HDL 42 11/06/2012 0945   CHOLHDL 2.2 08/04/2018 0829   CHOLHDL 3 09/09/2014 1451   VLDL 24.6 09/09/2014 1451   LDLCALC 62 08/04/2018 0829   LDLCALC 74 07/07/2013 0952   LDLCALC 76 11/06/2012 0945      Wt Readings from Last 3 Encounters:  03/25/19 232 lb (105.2 kg)  12/17/18 230 lb (104.3 kg)  09/03/18 226 lb (102.5 kg)      Other studies Reviewed: Additional studies/ records that were reviewed today include:  Labs Review of the above records demonstrates:  NA  ASSESSMENT AND PLAN:   CAD:   The patient has no new sypmtoms.  No further cardiovascular testing is indicated.  We will continue with aggressive risk reduction and meds as listed.   DYSLIPIDEMIA:   LDL was 62.  He will continue the meds as listed.  ATRIAL FIB:     The patient did have a reasonable rate on a Holter with a mean of 96.  However, today is in the 120s.  There were some frequent rapid rates.  I think it is most prudent to start Toprol-XL 50 mg daily.  I have asked him to get a Fitbit.  I talked to him again about the reasoning behind starting anticoagulation with Xarelto and stopping the aspirin that he was taking previously.   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  He drinks a lot of water because he was told to in the past and does not believe that he should reduce this.  He had an echo with no evidence of cardiac contribution to this.  I think this is probably venous insufficiency and excess water but he wants to treated conservatively and continue to wear compression stockings.  Current medicines are reviewed at length with the patient today.  The patient does not have concerns regarding medicines.  The following changes have been made:    As above  Labs/ tests ordered today include:    None  Orders Placed This Encounter  Procedures  . EKG 12-Lead     Disposition:   FU with me in 12 weeks.   Signed, Minus Breeding, MD  03/25/2019 2:27 PM    [place]

## 2019-03-25 ENCOUNTER — Ambulatory Visit (INDEPENDENT_AMBULATORY_CARE_PROVIDER_SITE_OTHER): Payer: Medicare HMO | Admitting: Cardiology

## 2019-03-25 ENCOUNTER — Encounter: Payer: Self-pay | Admitting: Cardiology

## 2019-03-25 ENCOUNTER — Other Ambulatory Visit: Payer: Self-pay

## 2019-03-25 VITALS — BP 138/80 | HR 117 | Ht 71.0 in | Wt 232.0 lb

## 2019-03-25 DIAGNOSIS — I4891 Unspecified atrial fibrillation: Secondary | ICD-10-CM

## 2019-03-25 DIAGNOSIS — I2510 Atherosclerotic heart disease of native coronary artery without angina pectoris: Secondary | ICD-10-CM | POA: Diagnosis not present

## 2019-03-25 DIAGNOSIS — I10 Essential (primary) hypertension: Secondary | ICD-10-CM | POA: Diagnosis not present

## 2019-03-25 MED ORDER — METOPROLOL SUCCINATE ER 50 MG PO TB24: 50.0000 mg | ORAL_TABLET | Freq: Every day | ORAL | 3 refills | Status: DC

## 2019-03-25 NOTE — Patient Instructions (Signed)
Medication Instructions:  Please start Metoprolol succinate 50 mg a day. Continue all other medications as listed.  If you need a refill on your cardiac medications before your next appointment, please call your pharmacy.   Follow-Up: Follow up in 3 months with Dr. Percival Spanish.  Thank you for choosing [facility]!!     FitBit

## 2019-04-11 DIAGNOSIS — G4733 Obstructive sleep apnea (adult) (pediatric): Secondary | ICD-10-CM | POA: Diagnosis not present

## 2019-04-24 NOTE — Progress Notes (Signed)
Labs from [place]

## 2019-04-27 ENCOUNTER — Telehealth: Payer: Self-pay | Admitting: Physician Assistant

## 2019-04-27 NOTE — Telephone Encounter (Signed)
Patient states that he has had urinary incontinence for the past week with no other symptoms and he would like a referral to urology.  Advised patient that he would need to be seen in office for referral to Urology.  appt made and patient aware

## 2019-04-27 NOTE — Telephone Encounter (Signed)
Did you make for Wednesday, my remote day?

## 2019-04-27 NOTE — Telephone Encounter (Signed)
It can be remote

## 2019-04-27 NOTE — Telephone Encounter (Signed)
Appt changed to televisit.  Patient aware  

## 2019-04-29 ENCOUNTER — Ambulatory Visit (INDEPENDENT_AMBULATORY_CARE_PROVIDER_SITE_OTHER): Payer: Medicare HMO | Admitting: Physician Assistant

## 2019-04-29 ENCOUNTER — Ambulatory Visit: Payer: Medicare HMO | Admitting: Physician Assistant

## 2019-04-29 ENCOUNTER — Encounter: Payer: Self-pay | Admitting: Physician Assistant

## 2019-04-29 DIAGNOSIS — R32 Unspecified urinary incontinence: Secondary | ICD-10-CM | POA: Diagnosis not present

## 2019-04-29 DIAGNOSIS — R351 Nocturia: Secondary | ICD-10-CM

## 2019-04-29 MED ORDER — TAMSULOSIN HCL 0.4 MG PO CAPS: 0.4000 mg | ORAL_CAPSULE | Freq: Every day | ORAL | 3 refills | Status: DC

## 2019-04-29 NOTE — Progress Notes (Signed)
    Telephone visit  Subjective: CC: Urinary incontinence PCP: Jared Sleeper, PA-C [phone] [name] is a 78 y.o. male calls for telephone consult today. Patient provides verbal consent for consult held via phone.  Patient is identified with 2 separate identifiers.  At this time the entire area is on COVID-19 social distancing and stay home orders are in place.  Patient is of higher risk and therefore we are performing this by a virtual method.  Location of patient: Home Location of provider: HOME Others present for call: No  This patient is this patient is having the patient reports that over the last few weeks that he has had at least 4 times of incontinence through the daytime.  He knows that if he has the urge to go to the bathroom he needs to go immediately or he will have this accident.  He denies any pain over the bladder or in the prostate area.  He denies any pain in the back.  The urine has not changed in any way.  He states that he does get up at night for 5 times a night.  He has not been to urology in some time.  He states he went back in the 1960s.  But is never been since then.  I have explained that working to start a medicine called Flomax to see if they can help with his flow but we will also make an appointment with urology for him to have this evaluated.   ROS: Per HPI  No Known Allergies Past Medical History:  Diagnosis Date  . 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 Diverticulosis   . Hepatomegaly   . Hx of colonic polyps   . Obesity, unspecified   . Obstructive sleep apnea    Uses CPAP.   Marland Kitchen Other and unspecified hyperlipidemia   . Unspecified essential hypertension     Current Outpatient Medications:  .  atorvastatin (LIPITOR) 40 MG tablet, TAKE 1 TABLET 2 TIMES A WEEK, Disp: 24 tablet, Rfl: 0 .  clopidogrel (PLAVIX) 75 MG tablet, Take 75 mg by mouth daily., Disp: , Rfl:  .  cyanocobalamin 1000 MCG tablet, Take 1,000 mcg  by mouth daily., Disp: , Rfl:  .  lisinopril (PRINIVIL,ZESTRIL) 40 MG tablet, Take 1 tablet (40 mg total) by mouth daily., Disp: 30 tablet, Rfl: 5 .  metoprolol succinate (TOPROL-XL) 50 MG 24 hr tablet, Take 1 tablet (50 mg total) by mouth daily. Take with or immediately following a meal., Disp: 90 tablet, Rfl: 3 .  Multiple Vitamins-Minerals (CENTRUM SILVER PO), Take 1 capsule by mouth daily.  , Disp: , Rfl:  .  nitroGLYCERIN (NITROSTAT) 0.4 MG SL tablet, DISSOLVE 1 TABLET UNDER TONGUE AS NEEDED FOR CHEST PAIN, Disp: 25 tablet, Rfl: 2 .  psyllium (METAMUCIL) 58.6 % packet, Take 1 packet by mouth daily., Disp: , Rfl:  .  rivaroxaban (XARELTO) 20 MG TABS tablet, Take 1 tablet (20 mg total) by mouth daily with supper., Disp: 30 tablet, Rfl: 11  Assessment/ Plan: 78 y.o. male   1. Urinary incontinence, unspecified type Urology referral  2. Nocturia more than twice per night Flomax 0.4 mg 1 at bedtime   No follow-ups on file.  Continue all other maintenance medications as listed above.  Start time: 2:36 PM End time: 2:45 PM  No orders of the defined types were placed in this encounter.   Jared Nearing PA-C [name] [phone]

## 2019-05-04 ENCOUNTER — Ambulatory Visit: Payer: Medicare HMO | Admitting: Physician Assistant

## 2019-05-11 ENCOUNTER — Other Ambulatory Visit: Payer: Self-pay

## 2019-05-11 ENCOUNTER — Encounter: Payer: Self-pay | Admitting: Physician Assistant

## 2019-05-11 ENCOUNTER — Ambulatory Visit (INDEPENDENT_AMBULATORY_CARE_PROVIDER_SITE_OTHER): Payer: Medicare HMO | Admitting: Physician Assistant

## 2019-05-11 VITALS — BP 120/72 | HR 94 | Temp 97.3°F | Ht 71.0 in | Wt 212.8 lb

## 2019-05-11 DIAGNOSIS — R69 Illness, unspecified: Secondary | ICD-10-CM | POA: Diagnosis not present

## 2019-05-11 DIAGNOSIS — R739 Hyperglycemia, unspecified: Secondary | ICD-10-CM

## 2019-05-11 DIAGNOSIS — E119 Type 2 diabetes mellitus without complications: Secondary | ICD-10-CM | POA: Diagnosis not present

## 2019-05-11 DIAGNOSIS — Z23 Encounter for immunization: Secondary | ICD-10-CM | POA: Diagnosis not present

## 2019-05-11 DIAGNOSIS — G4733 Obstructive sleep apnea (adult) (pediatric): Secondary | ICD-10-CM | POA: Diagnosis not present

## 2019-05-11 LAB — GLUCOSE HEMOCUE WAIVED: Glu Hemocue Waived: >444 mg/dL (ref 65–99)

## 2019-05-11 LAB — BAYER DCA HB A1C WAIVED: HB A1C (BAYER DCA - WAIVED): >14 % — ABNORMAL HIGH (ref <7.0)

## 2019-05-11 MED ORDER — METFORMIN HCL 500 MG PO TABS: 500.0000 mg | ORAL_TABLET | Freq: Two times a day (BID) | ORAL | 3 refills | Status: DC

## 2019-05-11 MED ORDER — GLIPIZIDE 10 MG PO TABS: 10.0000 mg | ORAL_TABLET | Freq: Every day | ORAL | 5 refills | Status: DC

## 2019-05-11 NOTE — Progress Notes (Signed)
BP 120/72   Pulse 94   Temp (!) 97.3 F (36.3 C) (Temporal)   Ht 5\' 11"  (1.803 m)   Wt 212 lb 12.8 oz (96.5 kg)   SpO2 98%   BMI 29.68 kg/m    Subjective:    Patient ID: [name], male    DOB: 17-Apr-1941, 78 y.o.   MRN: [id number]  HPI: [name] is a 78 y.o. male presenting on 05/11/2019 for Dizziness and Stuffy head  She has had a new gnosis of diabetes.  Earlier in the year his A1c was pretty well controlled.  He has had fairly well-controlled A1c's over the past 2 years.  However in recent weeks he has had increased weakness, dizziness, polyuria.  He states he has had a little bit of weight loss.  Past Medical History:  Diagnosis Date  . 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 Diverticulosis   . Hepatomegaly   . Hx of colonic polyps   . Obesity, unspecified   . Obstructive sleep apnea    Uses CPAP.   Marland Kitchen Other and unspecified hyperlipidemia   . Unspecified essential hypertension    Relevant past medical, surgical, family and social history reviewed and updated as indicated. Interim medical history since our last visit reviewed. Allergies and medications reviewed and updated. DATA REVIEWED: CHART IN EPIC  Family History reviewed for pertinent findings.  Review of Systems  Constitutional: Positive for fatigue. Negative for appetite change.  HENT: Negative.   Eyes: Negative.  Negative for pain and visual disturbance.  Respiratory: Negative.  Negative for cough, chest tightness, shortness of breath and wheezing.   Cardiovascular: Negative.  Negative for chest pain, palpitations and leg swelling.  Gastrointestinal: Negative.  Negative for abdominal pain, diarrhea, nausea and vomiting.  Endocrine: Positive for polydipsia and polyuria. Negative for polyphagia.  Genitourinary: Negative.   Musculoskeletal: Negative.   Skin: Negative.  Negative for color change and rash.  Neurological: Positive for weakness. Negative for numbness  and headaches.  Psychiatric/Behavioral: Negative.     Allergies as of 05/11/2019   No Known Allergies     Medication List       Accurate as of May 11, 2019 11:59 PM. If you have any questions, ask your nurse or doctor.        atorvastatin 40 MG tablet Commonly known as: LIPITOR TAKE 1 TABLET 2 TIMES A WEEK   CENTRUM SILVER PO Take 1 capsule by mouth daily.   clopidogrel 75 MG tablet Commonly known as: PLAVIX Take 75 mg by mouth daily.   cyanocobalamin 1000 MCG tablet Take 1,000 mcg by mouth daily.   glipiZIDE 10 MG tablet Commonly known as: GLUCOTROL Take 1 tablet (10 mg total) by mouth daily before breakfast. Started by: [name], PA-C   lisinopril 40 MG tablet Commonly known as: ZESTRIL Take 1 tablet (40 mg total) by mouth daily.   metFORMIN 500 MG tablet Commonly known as: GLUCOPHAGE Take 1 tablet (500 mg total) by mouth 2 (two) times daily with a meal. Started by: [name], PA-C   metoprolol succinate 50 MG 24 hr tablet Commonly known as: TOPROL-XL Take 1 tablet (50 mg total) by mouth daily. Take with or immediately following a meal.   nitroGLYCERIN 0.4 MG SL tablet Commonly known as: NITROSTAT DISSOLVE 1 TABLET UNDER TONGUE AS NEEDED FOR CHEST PAIN   psyllium 58.6 % packet Commonly known as: METAMUCIL Take 1 packet by mouth daily.  rivaroxaban 20 MG Tabs tablet Commonly known as: XARELTO Take 1 tablet (20 mg total) by mouth daily with supper.   tamsulosin 0.4 MG Caps capsule Commonly known as: FLOMAX Take 1 capsule (0.4 mg total) by mouth at bedtime.          Objective:    BP 120/72   Pulse 94   Temp (!) 97.3 F (36.3 C) (Temporal)   Ht 5\' 11"  (1.803 m)   Wt 212 lb 12.8 oz (96.5 kg)   SpO2 98%   BMI 29.68 kg/m   No Known Allergies  Wt Readings from Last 3 Encounters:  05/11/19 212 lb 12.8 oz (96.5 kg)  03/25/19 232 lb (105.2 kg)  12/17/18 230 lb (104.3 kg)    Physical Exam Vitals signs and nursing note reviewed.   Constitutional:      General: He is not in acute distress.    Appearance: He is well-developed.  HENT:     Head: Normocephalic and atraumatic.  Eyes:     Conjunctiva/sclera: Conjunctivae normal.     Pupils: Pupils are equal, round, and reactive to light.  Cardiovascular:     Rate and Rhythm: Normal rate and regular rhythm.     Heart sounds: Normal heart sounds.  Pulmonary:     Effort: Pulmonary effort is normal. No respiratory distress.     Breath sounds: Normal breath sounds.  Skin:    General: Skin is warm and dry.  Psychiatric:        Behavior: Behavior normal.     Results for orders placed or performed in visit on 05/11/19  Bayer DCA Hb A1c Waived  Result Value Ref Range   HB A1C (BAYER DCA - WAIVED) >14.0 (H) <7.0 %  Glucose Hemocue Waived  Result Value Ref Range   Glu Hemocue Waived >444 (HH) 65 - 99 mg/dL      Assessment & Plan:   1. Elevated blood sugar - Bayer DCA Hb A1c Waived - Glucose Hemocue Waived  2. Diabetes mellitus without complication ([place]) - Bayer DCA Hb A1c Waived - metFORMIN (GLUCOPHAGE) 500 MG tablet; Take 1 tablet (500 mg total) by mouth 2 (two) times daily with a meal.  Dispense: 180 tablet; Refill: 3 - glipiZIDE (GLUCOTROL) 10 MG tablet; Take 1 tablet (10 mg total) by mouth daily before breakfast.  Dispense: 30 tablet; Refill: 5  3. Need for immunization against influenza - Flu Vaccine QUAD High Dose(Fluad)   Continue all other maintenance medications as listed above.  Follow up plan: Return in about 3 months (around 08/11/2019) for recheck medications.  Educational handout given for carb counting  [name] PA-C [place] [address]  [place],  [address] [phone]   05/14/2019, 8:54 PM

## 2019-05-11 NOTE — Patient Instructions (Signed)
Carbohydrate Counting for Diabetes Mellitus, Adult  Carbohydrate counting is a method of keeping track of how many carbohydrates you eat. Eating carbohydrates naturally increases the amount of sugar (glucose) in the blood. Counting how many carbohydrates you eat helps keep your blood glucose within normal limits, which helps you manage your diabetes (diabetes mellitus). It is important to know how many carbohydrates you can safely have in each meal. This is different for every person. A diet and nutrition specialist (registered dietitian) can help you make a meal plan and calculate how many carbohydrates you should have at each meal and snack. Carbohydrates are found in the following foods:  Grains, such as breads and cereals.  Dried beans and soy products.  Starchy vegetables, such as potatoes, peas, and corn.  Fruit and fruit juices.  Milk and yogurt.  Sweets and snack foods, such as cake, cookies, candy, chips, and soft drinks. How do I count carbohydrates? There are two ways to count carbohydrates in food. You can use either of the methods or a combination of both. Reading "Nutrition Facts" on packaged food The "Nutrition Facts" list is included on the labels of almost all packaged foods and beverages in the [place] It includes:  The serving size.  Information about nutrients in each serving, including the grams (g) of carbohydrate per serving. To use the "Nutrition Facts":  Decide how many servings you will have.  Multiply the number of servings by the number of carbohydrates per serving.  The resulting number is the total amount of carbohydrates that you will be having. Learning standard serving sizes of other foods When you eat carbohydrate foods that are not packaged or do not include "Nutrition Facts" on the label, you need to measure the servings in order to count the amount of carbohydrates:  Measure the foods that you will eat with a food scale or measuring cup, if needed.   Decide how many standard-size servings you will eat.  Multiply the number of servings by 15. Most carbohydrate-rich foods have about 15 g of carbohydrates per serving. ? For example, if you eat 8 oz (170 g) of strawberries, you will have eaten 2 servings and 30 g of carbohydrates (2 servings x 15 g = 30 g).  For foods that have more than one food mixed, such as soups and casseroles, you must count the carbohydrates in each food that is included. The following list contains standard serving sizes of common carbohydrate-rich foods. Each of these servings has about 15 g of carbohydrates:   hamburger bun or  English muffin.   oz (15 mL) syrup.   oz (14 g) jelly.  1 slice of bread.  1 six-inch tortilla.  3 oz (85 g) cooked rice or pasta.  4 oz (113 g) cooked dried beans.  4 oz (113 g) starchy vegetable, such as peas, corn, or potatoes.  4 oz (113 g) hot cereal.  4 oz (113 g) mashed potatoes or  of a large baked potato.  4 oz (113 g) canned or frozen fruit.  4 oz (120 mL) fruit juice.  4-6 crackers.  6 chicken nuggets.  6 oz (170 g) unsweetened dry cereal.  6 oz (170 g) plain fat-free yogurt or yogurt sweetened with artificial sweeteners.  8 oz (240 mL) milk.  8 oz (170 g) fresh fruit or one small piece of fruit.  24 oz (680 g) popped popcorn. Example of carbohydrate counting Sample meal  3 oz (85 g) chicken breast.  6 oz (170 g)   brown rice.  4 oz (113 g) corn.  8 oz (240 mL) milk.  8 oz (170 g) strawberries with sugar-free whipped topping. Carbohydrate calculation 1. Identify the foods that contain carbohydrates: ? Rice. ? Corn. ? Milk. ? Strawberries. 2. Calculate how many servings you have of each food: ? 2 servings rice. ? 1 serving corn. ? 1 serving milk. ? 1 serving strawberries. 3. Multiply each number of servings by 15 g: ? 2 servings rice x 15 g = 30 g. ? 1 serving corn x 15 g = 15 g. ? 1 serving milk x 15 g = 15 g. ? 1 serving  strawberries x 15 g = 15 g. 4. Add together all of the amounts to find the total grams of carbohydrates eaten: ? 30 g + 15 g + 15 g + 15 g = 75 g of carbohydrates total. Summary  Carbohydrate counting is a method of keeping track of how many carbohydrates you eat.  Eating carbohydrates naturally increases the amount of sugar (glucose) in the blood.  Counting how many carbohydrates you eat helps keep your blood glucose within normal limits, which helps you manage your diabetes.  A diet and nutrition specialist (registered dietitian) can help you make a meal plan and calculate how many carbohydrates you should have at each meal and snack. This information is not intended to replace advice given to you by your health care provider. Make sure you discuss any questions you have with your health care provider. Document Released: 07/16/2005 Document Revised: 02/07/2017 Document Reviewed: 12/28/2015 Elsevier Patient Education  2020 Elsevier Inc.  

## 2019-05-18 ENCOUNTER — Other Ambulatory Visit: Payer: Self-pay

## 2019-05-18 DIAGNOSIS — Z20822 Contact with and (suspected) exposure to covid-19: Secondary | ICD-10-CM

## 2019-05-20 LAB — NOVEL CORONAVIRUS, NAA: SARS-CoV-2, NAA: NOT DETECTED

## 2019-06-02 ENCOUNTER — Telehealth: Payer: Self-pay | Admitting: Physician Assistant

## 2019-06-02 MED ORDER — ONETOUCH DELICA LANCETS 33G MISC: 1 refills | Status: DC

## 2019-06-02 MED ORDER — ONETOUCH VERIO VI STRP: 100.0000 | ORAL_STRIP | Freq: Three times a day (TID) | 1 refills | Status: DC

## 2019-06-02 NOTE — Telephone Encounter (Signed)
VMB not set up 

## 2019-06-02 NOTE — Telephone Encounter (Signed)
Patient aware sent in  

## 2019-06-02 NOTE — Telephone Encounter (Signed)
Patient needs needles and strips called into [facility]. Patient machine is one touch verio reflect

## 2019-06-05 DIAGNOSIS — R69 Illness, unspecified: Secondary | ICD-10-CM | POA: Diagnosis not present

## 2019-06-08 NOTE — Progress Notes (Signed)
Cardiology Office Note   Date:  06/10/2019   ID:  [name], DOB 24-May-1941, MRN [id number]  PCP:  [name], PA-C  Cardiologist:   Minus Breeding, MD  Referring:  Particia Nearing   Chief Complaint  Patient presents with  . Atrial Fibrillation     History of Present Illness: [name] is a 78 y.o. male who presents for follow up of CAD. He had a DES to the LAD in 2/08. Repeat cath in 11/11 showed patent stent.  Echo in 2/16 showed EF 55-60%, no significant valvular abnormalities.    He returns for follow up.  He has had no acute cardiac complaints.  Has had dizziness and been unsteady for about 4 to 5 months.  He is walking with a cane.  He has been found to have new onset diabetes with an A1c above 14.  Is not been elevated in the past.  He was having nocturia and other symptoms with a at least a 20 pound weight loss over few months so he says he did not change his diet.  He is having a little less dizziness but still it is persistent as his sugar has been controlled.  He is being controlled with oral medications.  He denies any change in his bowel habits.  He has had no abdominal complaints.  He denies any cardiovascular complaints. The patient denies any new symptoms such as chest discomfort, neck or arm discomfort. There has been no new shortness of breath, PND or orthopnea. There have been no reported palpitations, presyncope or syncope.  It does not seem like he is having any of the lower extremity swelling that he was having   Past Medical History:  Diagnosis Date  . 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 Diabetes ([place])   . Diverticulosis   . Hepatomegaly   . Hx of colonic polyps   . Obesity, unspecified   . Obstructive sleep apnea    Uses CPAP.   Marland Kitchen Other and unspecified hyperlipidemia   . Unspecified essential hypertension     Past Surgical History:  Procedure Laterality Date  . COLONOSCOPY  9/17/20012  . CORONARY  STENT PLACEMENT  08/2006   LAD  . POLYPECTOMY  09/17/20012  . TONSILLECTOMY       Current Outpatient Medications  Medication Sig Dispense Refill  . atorvastatin (LIPITOR) 40 MG tablet TAKE 1 TABLET 2 TIMES A WEEK 24 tablet 0  . clopidogrel (PLAVIX) 75 MG tablet Take 75 mg by mouth daily.    . cyanocobalamin 1000 MCG tablet Take 1,000 mcg by mouth daily.    Marland Kitchen glipiZIDE (GLUCOTROL) 10 MG tablet Take 1 tablet (10 mg total) by mouth daily before breakfast. 30 tablet 5  . lisinopril (PRINIVIL,ZESTRIL) 40 MG tablet Take 1 tablet (40 mg total) by mouth daily. 30 tablet 5  . metFORMIN (GLUCOPHAGE) 500 MG tablet Take 1 tablet (500 mg total) by mouth 2 (two) times daily with a meal. 180 tablet 3  . metoprolol succinate (TOPROL-XL) 50 MG 24 hr tablet Take 1 tablet (50 mg total) by mouth daily. Take with or immediately following a meal. 90 tablet 3  . Multiple Vitamins-Minerals (CENTRUM SILVER PO) Take 1 capsule by mouth daily.      . nitroGLYCERIN (NITROSTAT) 0.4 MG SL tablet DISSOLVE 1 TABLET UNDER TONGUE AS NEEDED FOR CHEST PAIN 25 tablet prn  . psyllium (METAMUCIL) 58.6 % packet Take 1 packet by mouth  daily.    . rivaroxaban (XARELTO) 20 MG TABS tablet Take 1 tablet (20 mg total) by mouth daily with supper. 30 tablet 11  . tamsulosin (FLOMAX) 0.4 MG CAPS capsule Take 1 capsule (0.4 mg total) by mouth at bedtime. 30 capsule 3  . Blood Glucose Monitoring Suppl (ONETOUCH VERIO REFLECT) w/Device KIT See admin instructions.    [name] Delica Lancets 19F MISC TEST 3 TIME DAILY 100 each 1  . ONETOUCH VERIO test strip 100 each by Other route 3 (three) times daily. for testing R73.9 100 each 1   No current facility-administered medications for this visit.     Allergies:   Patient has no known allergies.    ROS:  Please see the history of present illness.   Otherwise, review of systems are positive for none.   All other systems are reviewed and negative.    PHYSICAL EXAM: VS:  BP 118/60   Pulse  76   Ht _0  (1.803 m)   Wt 216 lb (98 kg)   BMI 30.13 kg/m  , BMI Body mass index is 30.13 kg/m.  GENERAL:  Well appearing NECK:  No jugular venous distention, waveform within normal limits, carotid upstroke brisk and symmetric, no bruits, no thyromegaly LUNGS:  Clear to auscultation bilaterally CHEST:  Unremarkable HEART:  PMI not displaced or sustained,S1 and S2 within normal limits, no S3, no S4, no clicks, no rubs, no murmurs ABD:  Flat, positive bowel sounds normal in frequency in pitch, no bruits, no rebound, no guarding, no midline pulsatile mass, no hepatomegaly, no splenomegaly EXT:  2 plus pulses throughout, no edema, no cyanosis no clubbing   EKG:  EKG is not  ordered today. The ekg ordered 8/20/20demonstrates atrial fib, rate 117, PVCs, axis within normal limits, intervals within normal limits, no acute ST-T wave changes.   Recent Labs: 08/04/2018: ALT 26; BUN 8; Creatinine, Ser 0.83; Hemoglobin 13.7; Platelets 82; Potassium 3.9; Sodium 138    Lipid Panel    Component Value Date/Time   CHOL 138 08/04/2018 0829   CHOL 138 11/06/2012 0945   TRIG 63 08/04/2018 0829   TRIG 108 07/07/2013 0952   TRIG 98 11/06/2012 0945   HDL 63 08/04/2018 0829   HDL 45 07/07/2013 0952   HDL 42 11/06/2012 0945   CHOLHDL 2.2 08/04/2018 0829   CHOLHDL 3 09/09/2014 1451   VLDL 24.6 09/09/2014 1451   LDLCALC 62 08/04/2018 0829   LDLCALC 74 07/07/2013 0952   LDLCALC 76 11/06/2012 0945      Wt Readings from Last 3 Encounters:  06/10/19 216 lb (98 kg)  05/11/19 212 lb 12.8 oz (96.5 kg)  03/25/19 232 lb (105.2 kg)      Other studies Reviewed: Additional studies/ records that were reviewed today include:  Labs Review of the above records demonstrates:  NA  ASSESSMENT AND PLAN:   CAD:    The patient has no new sypmtoms.  No further cardiovascular testing is indicated.  We will continue with aggressive risk reduction and meds as listed.  DYSLIPIDEMIA:   LDL was 62.  Continue  current therapy.   ATRIAL FIB:       His rate was reasonably well controlled on Holter previously.  Today's heart rate 73.  He is not having any new symptoms.  No change in therapy.  DM:   I spoke with [name], PA-C to discuss imaging the abdomen to make sure there is not a pancreatic process given the diabetes sudden  onset and weight loss  DIZZINESS:  I have suggested a neurology appt.     Current medicines are reviewed at length with the patient today.  The patient does not have concerns regarding medicines.  The following changes have been made:    None  Labs/ tests ordered today include:   None  No orders of the defined types were placed in this encounter.    Disposition:   FU with me in 6   Signed, Minus Breeding, MD  06/10/2019 2:10 PM    [facility]

## 2019-06-10 ENCOUNTER — Encounter: Payer: Self-pay | Admitting: Cardiology

## 2019-06-10 ENCOUNTER — Other Ambulatory Visit: Payer: Self-pay

## 2019-06-10 ENCOUNTER — Ambulatory Visit: Payer: Medicare HMO | Admitting: Cardiology

## 2019-06-10 ENCOUNTER — Telehealth: Payer: Self-pay | Admitting: Physician Assistant

## 2019-06-10 VITALS — BP 118/60 | HR 76 | Ht 71.0 in | Wt 216.0 lb

## 2019-06-10 DIAGNOSIS — I2510 Atherosclerotic heart disease of native coronary artery without angina pectoris: Secondary | ICD-10-CM

## 2019-06-10 DIAGNOSIS — I4821 Permanent atrial fibrillation: Secondary | ICD-10-CM | POA: Diagnosis not present

## 2019-06-10 DIAGNOSIS — E785 Hyperlipidemia, unspecified: Secondary | ICD-10-CM

## 2019-06-10 DIAGNOSIS — R609 Edema, unspecified: Secondary | ICD-10-CM | POA: Diagnosis not present

## 2019-06-10 DIAGNOSIS — R42 Dizziness and giddiness: Secondary | ICD-10-CM

## 2019-06-10 DIAGNOSIS — R634 Abnormal weight loss: Secondary | ICD-10-CM

## 2019-06-10 DIAGNOSIS — R296 Repeated falls: Secondary | ICD-10-CM

## 2019-06-10 DIAGNOSIS — E1165 Type 2 diabetes mellitus with hyperglycemia: Secondary | ICD-10-CM

## 2019-06-10 MED ORDER — NITROGLYCERIN 0.4 MG SL SUBL: SUBLINGUAL_TABLET | SUBLINGUAL | 99 refills | Status: DC

## 2019-06-10 NOTE — Patient Instructions (Signed)
Medication Instructions:  The current medical regimen is effective;  continue present plan and medications.  *If you need a refill on your cardiac medications before your next appointment, please call your pharmacy*  Follow-Up: At [facility], you and your health needs are our priority.  As part of our continuing mission to provide you with exceptional heart care, we have created designated Provider Care Teams.  These Care Teams include your primary Cardiologist (physician) and Advanced Practice Providers (APPs -  Physician Assistants and Nurse Practitioners) who all work together to provide you with the care you need, when you need it.  Your next appointment:   12 months  The format for your next appointment:   In Person  Provider:   In [place] with Dr Hochrein  Thank you for choosing [facility]!!     

## 2019-06-10 NOTE — Telephone Encounter (Signed)
FYI  Patient aware That we want to order a scan. He reports that on December 14 there is a scan scheduled through the [place]. He is not having his colonoscopy and EGD until March 2021. If this scan will look at the abdomen we do not need to order it. He is to let us know.  Also working to get an neurology appointment put in, I discussed this with him. Referral has been placed.

## 2019-06-11 ENCOUNTER — Telehealth: Payer: Self-pay | Admitting: Physician Assistant

## 2019-06-11 NOTE — Telephone Encounter (Signed)
Attempted to contact patient - NVM 

## 2019-06-12 ENCOUNTER — Telehealth: Payer: Self-pay | Admitting: Physician Assistant

## 2019-06-12 ENCOUNTER — Other Ambulatory Visit: Payer: Self-pay | Admitting: Physician Assistant

## 2019-06-12 NOTE — Telephone Encounter (Signed)
Patient wants to let you know the test he is supposed to have is an abdominal ultrasound and they are mainly looking at his liver.

## 2019-06-15 ENCOUNTER — Other Ambulatory Visit (INDEPENDENT_AMBULATORY_CARE_PROVIDER_SITE_OTHER): Payer: Medicare HMO | Admitting: Physician Assistant

## 2019-06-15 DIAGNOSIS — R634 Abnormal weight loss: Secondary | ICD-10-CM

## 2019-06-15 DIAGNOSIS — R7309 Other abnormal glucose: Secondary | ICD-10-CM

## 2019-06-15 NOTE — Telephone Encounter (Signed)
Ordered and BMET ordered

## 2019-06-15 NOTE — Progress Notes (Signed)
ct 

## 2019-06-15 NOTE — Telephone Encounter (Signed)
This is probably not specific enough to look at the whole abdomen and organs. I would like to proceed with an abd/pel CT with and without contrast. BMET will be needed.

## 2019-06-15 NOTE — Telephone Encounter (Signed)
Pt is aware and is ok with going ahead and getting the abd/pel CT

## 2019-06-17 ENCOUNTER — Encounter: Payer: Self-pay | Admitting: Neurology

## 2019-06-17 ENCOUNTER — Other Ambulatory Visit: Payer: Self-pay

## 2019-06-17 ENCOUNTER — Ambulatory Visit: Payer: Medicare HMO | Admitting: Neurology

## 2019-06-17 VITALS — BP 105/60 | HR 58 | Temp 98.9°F | Ht 71.0 in | Wt 216.0 lb

## 2019-06-17 DIAGNOSIS — E559 Vitamin D deficiency, unspecified: Secondary | ICD-10-CM

## 2019-06-17 DIAGNOSIS — G629 Polyneuropathy, unspecified: Secondary | ICD-10-CM

## 2019-06-17 DIAGNOSIS — G3281 Cerebellar ataxia in diseases classified elsewhere: Secondary | ICD-10-CM | POA: Diagnosis not present

## 2019-06-17 DIAGNOSIS — R413 Other amnesia: Secondary | ICD-10-CM | POA: Insufficient documentation

## 2019-06-17 DIAGNOSIS — G4733 Obstructive sleep apnea (adult) (pediatric): Secondary | ICD-10-CM | POA: Diagnosis not present

## 2019-06-17 DIAGNOSIS — R200 Anesthesia of skin: Secondary | ICD-10-CM

## 2019-06-17 DIAGNOSIS — R269 Unspecified abnormalities of gait and mobility: Secondary | ICD-10-CM | POA: Diagnosis not present

## 2019-06-17 NOTE — Progress Notes (Signed)
[facility]  PATIENT: [name] DOB: [birth year]  REFERRING DOCTOR OR PCP: Donaciano Eva, PA-C SOURCE: Patient, notes from PCP  _________________________________   HISTORICAL  CHIEF COMPLAINT:  Chief Complaint  Patient presents with  . New Patient (Initial Visit)    Rm 13 alone referred by Particia Nearing, PA-C for weight loss/dizzines/frequent falls  . Dizziness    pt sts he has had increased dizziness and unsteady on his feet  . Gait Problem    Ambulating with cane reports several falls last one being 2 weeks ago no injury     HISTORY OF PRESENT ILLNESS:  I had the pleasure seeing your patient, [name], at [facility] for neurologic consultation regarding his worsening gait over the last month or so.  He is a 78 year old man with gait disturbance and frequent falls.   He started having mildly reduced gait about a year ago but feels he has progressed much more in the  past 3 months, especially the last few weeks. He fell once in the yard and twice in his house.    One fall was associated with hyperglycemia.   He has recently been diagnosed with DM (4-5 weeks ago).    One fall was associated with high glucose.     Over the past 2 years, he has had progressive numbness in his feet up to the ankles and fingers.    He also feels his legs are slightly weak.   Over the last month, he notes balance is worse when his eyes are closed.    Also over the last month, when eyes are closed he feels more unsteady and dizzy ('swimmy headed' not clear vertigo).     He has lost about 25 pounds over the last month.  He has also noticed that hsi short term memory is worse the past couple months.   He feels long term memory is fine.     REVIEW OF SYSTEMS: Constitutional: No fevers, chills, sweats, or change in appetite.  He has some weight loss. Eyes: No visual changes, double vision, eye pain Ear, nose and throat: No hearing loss, ear pain, nasal  congestion, sore throat Cardiovascular: No chest pain, palpitations Respiratory: No shortness of breath at rest or with exertion.   No wheezes GastrointestinaI: No nausea, vomiting, diarrhea, abdominal pain, fecal incontinence Genitourinary: No dysuria, urinary retention or frequency.  No nocturia. Musculoskeletal: No neck pain, back pain Integumentary: No rash, pruritus, skin lesions Neurological: as above Psychiatric: No depression at this time.  No anxiety Endocrine: No palpitations, diaphoresis, change in appetite, change in weigh or increased thirst Hematologic/Lymphatic: No anemia, purpura, petechiae. Allergic/Immunologic: No itchy/runny eyes, nasal congestion, recent allergic reactions, rashes  ALLERGIES: No Known Allergies  HOME MEDICATIONS:  Current Outpatient Medications:  .  atorvastatin (LIPITOR) 40 MG tablet, TAKE 1 TABLET 2 TIMES A WEEK, Disp: 24 tablet, Rfl: 1 .  Blood Glucose Monitoring Suppl (ONETOUCH VERIO REFLECT) w/Device KIT, See admin instructions., Disp: , Rfl:  .  clopidogrel (PLAVIX) 75 MG tablet, Take 75 mg by mouth daily., Disp: , Rfl:  .  cyanocobalamin 1000 MCG tablet, Take 1,000 mcg by mouth daily., Disp: , Rfl:  .  glipiZIDE (GLUCOTROL) 10 MG tablet, Take 1 tablet (10 mg total) by mouth daily before breakfast., Disp: 30 tablet, Rfl: 5 .  lisinopril (PRINIVIL,ZESTRIL) 40 MG tablet, Take 1 tablet (40 mg total) by mouth daily., Disp: 30 tablet, Rfl: 5 .  metFORMIN (GLUCOPHAGE) 500 MG tablet,  Take 1 tablet (500 mg total) by mouth 2 (two) times daily with a meal., Disp: 180 tablet, Rfl: 3 .  metoprolol succinate (TOPROL-XL) 50 MG 24 hr tablet, Take 1 tablet (50 mg total) by mouth daily. Take with or immediately following a meal., Disp: 90 tablet, Rfl: 3 .  Multiple Vitamins-Minerals (CENTRUM SILVER PO), Take 1 capsule by mouth daily.  , Disp: , Rfl:  .  nitroGLYCERIN (NITROSTAT) 0.4 MG SL tablet, DISSOLVE 1 TABLET UNDER TONGUE AS NEEDED FOR CHEST PAIN, Disp:  25 tablet, Rfl: prn .  OneTouch Delica Lancets 90X MISC, TEST 3 TIME DAILY, Disp: 100 each, Rfl: 1 .  ONETOUCH VERIO test strip, 100 each by Other route 3 (three) times daily. for testing R73.9, Disp: 100 each, Rfl: 1 .  psyllium (METAMUCIL) 58.6 % packet, Take 1 packet by mouth daily., Disp: , Rfl:  .  rivaroxaban (XARELTO) 20 MG TABS tablet, Take 1 tablet (20 mg total) by mouth daily with supper., Disp: 30 tablet, Rfl: 11 .  tamsulosin (FLOMAX) 0.4 MG CAPS capsule, Take 1 capsule (0.4 mg total) by mouth at bedtime., Disp: 30 capsule, Rfl: 3  PAST MEDICAL HISTORY: Past Medical History:  Diagnosis Date  . 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 Diabetes ([place])   . Diverticulosis   . Hepatomegaly   . Hx of colonic polyps   . Obesity, unspecified   . Obstructive sleep apnea    Uses CPAP.   Marland Kitchen Other and unspecified hyperlipidemia   . Unspecified essential hypertension     PAST SURGICAL HISTORY: Past Surgical History:  Procedure Laterality Date  . COLONOSCOPY  9/17/20012  . CORONARY STENT PLACEMENT  08/2006   LAD  . POLYPECTOMY  09/17/20012  . TONSILLECTOMY      FAMILY HISTORY: Family History  Problem Relation Age of Onset  . Heart disease Father        and Multiple family members on father Side   . Cancer Mother   . Hypertension Mother   . Hypertension Sister   . Irritable bowel syndrome Maternal Grandmother   . Colon cancer Neg Hx     SOCIAL HISTORY:  Social History   Socioeconomic History  . Marital status: Married    Spouse name: Not on file  . Number of children: 1  . Years of education: 12 years  . Highest education level: Some college, no degree  Occupational History  . Occupation: Nurse, learning disability    Comment: Retired   . 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 Financial resource strain: Not hard at all  . Food insecurity    Worry: Never true    Inability: Never true  .  Transportation needs    Medical: No    Non-medical: No  Tobacco Use  . Smoking status: Former Smoker    Packs/day: 1.00    Types: Cigarettes    Quit date: 03/06/1971    Years since quitting: 48.3  . Smokeless tobacco: Former Systems developer    Types: [place] date: 07/30/1989  Substance and Sexual Activity  . Alcohol use: No    Comment: 1 drink daily   . Drug use: No  . Sexual activity: Never  Lifestyle  . Physical activity    Days per week: 3 days    Minutes per session: 90 min  . Stress: Only a little  Relationships  . Social connections  Talks on phone: More than three times a week    Gets together: More than three times a week    Attends religious service: More than 4 times per year    Active member of club or organization: No    Attends meetings of clubs or organizations: Never    Relationship status: Married  . Intimate partner violence    Fear of current or ex partner: No    Emotionally abused: No    Physically abused: No    Forced sexual activity: No  Other Topics Concern  . 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 He is married and lives at home with his wife. He has one adult son. Their teenage granddaughter has recently moved in with them.    Right handed    Caffeine~ does not use      PHYSICAL EXAM  Vitals:   06/17/19 1058  BP: 105/60  Pulse: (!) 58  Temp: 98.9 F (37.2 C)  TempSrc: Temporal  Weight: 216 lb (98 kg)  Height: '5\' 11"'$  (1.803 m)    Body mass index is 30.13 kg/m.   General: The patient is well-developed and well-nourished and in no acute distress  HEENT:  Head is [place]/AT.  Sclera are anicteric.    Neck: No carotid bruits are noted.  The neck is nontender.  Cardiovascular: The heart has a regular rate and rhythm with a normal S1 and S2. There were no murmurs, gallops or rubs.    Skin: Extremities are without rash or  edema.  Musculoskeletal:  Back is nontender   Neurologic Exam  Mental status: The patient is alert and oriented x 3 at the time of the examination. The patient has apparent normal recent (3/3) and remote memory, with an apparently normal attention span and concentration ability ([id number]).   Speech is normal.  Clock is drawn well.    Cranial nerves: Extraocular movements are full. Pupils are equal, round, and reactive to light and accomodation.  Visual fields are full.  Facial symmetry is present. There is good facial sensation to soft touch bilaterally.Facial strength is normal.  Trapezius and sternocleidomastoid strength is normal. No dysarthria is noted.    Bilateral hearing deficits are noted.  Motor:  Muscle bulk is normal.   Tone is normal. Strength is  5 / 5 in all 4 extremities.   Sensory: He has reduced vibration sensation in the fingers relative to the hand and very reduced sensation to pinprick in the fingertips and distal fingers relative to the hand.  He has normal sensation to vibration touch and temperature at the knees.  He has absent sensation to touch in the feet up to the ankles bilaterally and no sensation to vibration at the toes and mild's sensation at the ankles.  Coordination: Cerebellar testing reveals good finger-nose-finger and heel-to-shin bilaterally.  Gait and station: Station is normal.   The gait is wide and he stumbled as he turned.  With a cane he does better.  He cannot do tandem gait.  Romberg is positive.    Reflexes: Deep tendon reflexes are symmetric and present at the arms and knees but absent at the ankles..   Plantar responses are flexor.    DIAGNOSTIC DATA (LABS, IMAGING, TESTING) - I reviewed patient records, labs, notes, testing and imaging myself where available.  Lab Results  Component Value Date   WBC 6.1 08/04/2018   HGB 13.7 08/04/2018  HCT 37.9 08/04/2018   MCV 101 (H) 08/04/2018   PLT 82 (LL) 08/04/2018      Component Value Date/Time   NA 138 08/04/2018 0829   K 3.9  08/04/2018 0829   CL 100 08/04/2018 0829   CO2 25 08/04/2018 0829   GLUCOSE 151 (H) 08/04/2018 0829   GLUCOSE 106 (H) 09/09/2014 1451   BUN 8 08/04/2018 0829   CREATININE 0.83 08/04/2018 0829   CREATININE 0.94 11/06/2012 0945   CALCIUM 9.4 08/04/2018 0829   PROT 6.3 08/04/2018 0829   ALBUMIN 3.5 08/04/2018 0829   AST 60 (H) 08/04/2018 0829   ALT 26 08/04/2018 0829   ALKPHOS 145 (H) 08/04/2018 0829   BILITOT 1.7 (H) 08/04/2018 0829   GFRNONAA 85 08/04/2018 0829   GFRAA 99 08/04/2018 0829   Lab Results  Component Value Date   CHOL 138 08/04/2018   HDL 63 08/04/2018   LDLCALC 62 08/04/2018   TRIG 63 08/04/2018   CHOLHDL 2.2 08/04/2018   Lab Results  Component Value Date   HGBA1C >14.0 (H) 05/11/2019   No results found for: VITAMINB12 Lab Results  Component Value Date   TSH 2.899 06/18/2010       ASSESSMENT AND PLAN  Polyneuropathy - Plan: Vitamin B12, Multiple Myeloma Panel (SPEP&IFE w/QIG), Sjogren's syndrome antibods(ssa + ssb), Rheumatoid factor, CBC with Differential/Platelets, CMP, NCV with EMG(electromyography)  Cerebellar ataxia in diseases classified elsewhere ([place]) - Plan: MR BRAIN WO CONTRAST, MR CERVICAL SPINE WO CONTRAST  Memory loss - Plan: Vitamin B12, CMP, MR BRAIN WO CONTRAST  Gait disturbance - Plan: Vitamin B12, Multiple Myeloma Panel (SPEP&IFE w/QIG), Sjogren's syndrome antibods(ssa + ssb), Rheumatoid factor, CBC with Differential/Platelets, CMP  Numbness - Plan: Vitamin B12, Multiple Myeloma Panel (SPEP&IFE w/QIG), Sjogren's syndrome antibods(ssa + ssb), Rheumatoid factor, CBC with Differential/Platelets, CMP, NCV with EMG(electromyography)  Vitamin D deficiency - Plan: Vitamin D, 25-hydroxy   In summary, Mr. Siwek is a 78 year old man with progressive ataxia over the past few weeks.  Additionally, he has more numbness in his hands and feet.  Most likely, the reduced gait and numbness is due to a significant polyneuropathy.  Another possibility  is that he has a significant intrinsic or extrinsic myelopathy in the cervical spine.   B12 deficiency or MGUS might also cause the symptoms.  We will check blood work for various causes of polyneuropathy and check an MRI of the brain and cervical spine to determine if there is a myelopathy, hydrocephalus or ischemic change that might be explaining his symptoms.  Because his symptoms have progressed over a period of a month or so, we will check a NCV/EMG study to help determine if the polyneuropathy is axonal or demyelinating and whether or not he could have chronic inflammatory demyelinating polyneuropathy..  If he does have CIDP we would need to consider treatment with a steroid or IVIG.  Laboratory evaluation will also be useful to work-up his mild memory issues.  These are more likely to be due to decreased focus and attention than to a degenerative process.  I will see him when he comes in for the NCV/EMG study.  We will call with results before that if abnormal.  He is advised to call us if he has any new or worsening symptoms.  Thank you for asking me to see Mr. Dils.  Please let me know if I can be of further assistance with him or other patients in the future.   Richard A. [name], MD, [facility] 06/17/2019, 11:04  AM Certified in Neurology, Clinical Neurophysiology, Sleep Medicine and Neuroimaging  [facility] [address] [place], [place] [address] [phone]

## 2019-06-21 LAB — CBC WITH DIFFERENTIAL/PLATELET
Basophils Absolute: 0.1 10*3/uL (ref 0.0–0.2)
Basos: 1 %
EOS (ABSOLUTE): 0.1 10*3/uL (ref 0.0–0.4)
Eos: 2 %
Hematocrit: 35.5 % — ABNORMAL LOW (ref 37.5–51.0)
Hemoglobin: 12.5 g/dL — ABNORMAL LOW (ref 13.0–17.7)
Immature Grans (Abs): 0 10*3/uL (ref 0.0–0.1)
Immature Granulocytes: 0 %
Lymphocytes Absolute: 1.2 10*3/uL (ref 0.7–3.1)
Lymphs: 23 %
MCH: 35.1 pg — ABNORMAL HIGH (ref 26.6–33.0)
MCHC: 35.2 g/dL (ref 31.5–35.7)
MCV: 100 fL — ABNORMAL HIGH (ref 79–97)
Monocytes Absolute: 0.6 10*3/uL (ref 0.1–0.9)
Monocytes: 10 %
Neutrophils Absolute: 3.5 10*3/uL (ref 1.4–7.0)
Neutrophils: 64 %
Platelets: 95 10*3/uL — CL (ref 150–450)
RBC: 3.56 x10E6/uL — ABNORMAL LOW (ref 4.14–5.80)
RDW: 11.6 % (ref 11.6–15.4)
WBC: 5.5 10*3/uL (ref 3.4–10.8)

## 2019-06-21 LAB — MULTIPLE MYELOMA PANEL, SERUM
Albumin SerPl Elph-Mcnc: 3.5 g/dL (ref 2.9–4.4)
Albumin/Glob SerPl: 1.3 (ref 0.7–1.7)
Alpha 1: 0.3 g/dL (ref 0.0–0.4)
Alpha2 Glob SerPl Elph-Mcnc: 0.5 g/dL (ref 0.4–1.0)
B-Globulin SerPl Elph-Mcnc: 1.1 g/dL (ref 0.7–1.3)
Gamma Glob SerPl Elph-Mcnc: 0.9 g/dL (ref 0.4–1.8)
Globulin, Total: 2.9 g/dL (ref 2.2–3.9)
IgA/Immunoglobulin A, Serum: 423 mg/dL (ref 61–437)
IgG (Immunoglobin G), Serum: 1114 mg/dL (ref 603–1613)
IgM (Immunoglobulin M), Srm: 47 mg/dL (ref 15–143)
M Protein SerPl Elph-Mcnc: 0.4 g/dL — ABNORMAL HIGH

## 2019-06-21 LAB — SJOGREN'S SYNDROME ANTIBODS(SSA + SSB)
ENA SSA (RO) Ab: <0.2 AI (ref 0.0–0.9)
ENA SSB (LA) Ab: <0.2 AI (ref 0.0–0.9)

## 2019-06-21 LAB — RHEUMATOID FACTOR: Rheumatoid fact SerPl-aCnc: 10 IU/mL (ref 0.0–13.9)

## 2019-06-21 LAB — VITAMIN B12: Vitamin B-12: 726 pg/mL (ref 232–1245)

## 2019-06-21 LAB — COMPREHENSIVE METABOLIC PANEL
ALT: 18 IU/L (ref 0–44)
AST: 28 IU/L (ref 0–40)
Albumin/Globulin Ratio: 1.6 (ref 1.2–2.2)
Albumin: 3.9 g/dL (ref 3.7–4.7)
Alkaline Phosphatase: 119 IU/L — ABNORMAL HIGH (ref 39–117)
BUN/Creatinine Ratio: 13 (ref 10–24)
BUN: 11 mg/dL (ref 8–27)
Bilirubin Total: 0.8 mg/dL (ref 0.0–1.2)
CO2: 25 mmol/L (ref 20–29)
Calcium: 9.9 mg/dL (ref 8.6–10.2)
Chloride: 107 mmol/L — ABNORMAL HIGH (ref 96–106)
Creatinine, Ser: 0.85 mg/dL (ref 0.76–1.27)
GFR calc Af Amer: 97 mL/min/{1.73_m2} (ref >59)
GFR calc non Af Amer: 84 mL/min/{1.73_m2} (ref >59)
Globulin, Total: 2.5 g/dL (ref 1.5–4.5)
Glucose: 89 mg/dL (ref 65–99)
Potassium: 4.6 mmol/L (ref 3.5–5.2)
Sodium: 144 mmol/L (ref 134–144)
Total Protein: 6.4 g/dL (ref 6.0–8.5)

## 2019-06-21 LAB — VITAMIN D 25 HYDROXY (VIT D DEFICIENCY, FRACTURES): Vit D, 25-Hydroxy: 35.4 ng/mL (ref 30.0–100.0)

## 2019-06-23 ENCOUNTER — Telehealth: Payer: Self-pay

## 2019-06-23 DIAGNOSIS — D472 Monoclonal gammopathy: Secondary | ICD-10-CM

## 2019-06-23 NOTE — Telephone Encounter (Signed)
I have contacted the pt and advised of results.  Pt verbalized understanding and was in agreement with referral. I have placed the referral with note to send recent blood work with the referral.

## 2019-06-23 NOTE — Telephone Encounter (Signed)
-----   Message from Britt Bottom, MD sent at 06/22/2019  3:22 PM EST ----- Please let him know that he did have abnormal antibodies that may be causing his polyneuropathy.  He also has low platelets.  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

## 2019-06-29 ENCOUNTER — Encounter (HOSPITAL_COMMUNITY): Payer: Self-pay

## 2019-06-30 ENCOUNTER — Encounter: Payer: Medicare HMO | Admitting: Neurology

## 2019-06-30 ENCOUNTER — Inpatient Hospital Stay (HOSPITAL_COMMUNITY): Payer: Medicare HMO

## 2019-06-30 ENCOUNTER — Ambulatory Visit (HOSPITAL_COMMUNITY): Payer: Medicare HMO | Admitting: Hematology

## 2019-06-30 ENCOUNTER — Encounter (HOSPITAL_COMMUNITY): Payer: Self-pay | Admitting: Hematology

## 2019-06-30 ENCOUNTER — Other Ambulatory Visit: Payer: Self-pay

## 2019-06-30 ENCOUNTER — Inpatient Hospital Stay (HOSPITAL_COMMUNITY): Payer: Medicare HMO | Attending: Nurse Practitioner | Admitting: Hematology

## 2019-06-30 VITALS — BP 131/59 | HR 58 | Temp 98.0°F | Resp 18 | Ht 69.0 in | Wt 218.0 lb

## 2019-06-30 DIAGNOSIS — Z8379 Family history of other diseases of the digestive system: Secondary | ICD-10-CM | POA: Diagnosis not present

## 2019-06-30 DIAGNOSIS — R600 Localized edema: Secondary | ICD-10-CM | POA: Insufficient documentation

## 2019-06-30 DIAGNOSIS — E1140 Type 2 diabetes mellitus with diabetic neuropathy, unspecified: Secondary | ICD-10-CM | POA: Insufficient documentation

## 2019-06-30 DIAGNOSIS — E785 Hyperlipidemia, unspecified: Secondary | ICD-10-CM | POA: Diagnosis not present

## 2019-06-30 DIAGNOSIS — Z79899 Other long term (current) drug therapy: Secondary | ICD-10-CM | POA: Insufficient documentation

## 2019-06-30 DIAGNOSIS — C9000 Multiple myeloma not having achieved remission: Secondary | ICD-10-CM | POA: Diagnosis not present

## 2019-06-30 DIAGNOSIS — Z8719 Personal history of other diseases of the digestive system: Secondary | ICD-10-CM | POA: Insufficient documentation

## 2019-06-30 DIAGNOSIS — E119 Type 2 diabetes mellitus without complications: Secondary | ICD-10-CM | POA: Diagnosis not present

## 2019-06-30 DIAGNOSIS — Z7289 Other problems related to lifestyle: Secondary | ICD-10-CM

## 2019-06-30 DIAGNOSIS — D696 Thrombocytopenia, unspecified: Secondary | ICD-10-CM | POA: Diagnosis not present

## 2019-06-30 DIAGNOSIS — G629 Polyneuropathy, unspecified: Secondary | ICD-10-CM | POA: Diagnosis not present

## 2019-06-30 DIAGNOSIS — R2689 Other abnormalities of gait and mobility: Secondary | ICD-10-CM | POA: Diagnosis not present

## 2019-06-30 DIAGNOSIS — K5790 Diverticulosis of intestine, part unspecified, without perforation or abscess without bleeding: Secondary | ICD-10-CM | POA: Insufficient documentation

## 2019-06-30 DIAGNOSIS — Z809 Family history of malignant neoplasm, unspecified: Secondary | ICD-10-CM

## 2019-06-30 DIAGNOSIS — G4733 Obstructive sleep apnea (adult) (pediatric): Secondary | ICD-10-CM | POA: Diagnosis not present

## 2019-06-30 DIAGNOSIS — Z7984 Long term (current) use of oral hypoglycemic drugs: Secondary | ICD-10-CM | POA: Diagnosis not present

## 2019-06-30 DIAGNOSIS — Z7901 Long term (current) use of anticoagulants: Secondary | ICD-10-CM | POA: Diagnosis not present

## 2019-06-30 DIAGNOSIS — I10 Essential (primary) hypertension: Secondary | ICD-10-CM | POA: Insufficient documentation

## 2019-06-30 DIAGNOSIS — R160 Hepatomegaly, not elsewhere classified: Secondary | ICD-10-CM | POA: Insufficient documentation

## 2019-06-30 DIAGNOSIS — Z87891 Personal history of nicotine dependence: Secondary | ICD-10-CM | POA: Diagnosis not present

## 2019-06-30 DIAGNOSIS — Z8249 Family history of ischemic heart disease and other diseases of the circulatory system: Secondary | ICD-10-CM | POA: Insufficient documentation

## 2019-06-30 DIAGNOSIS — R42 Dizziness and giddiness: Secondary | ICD-10-CM | POA: Diagnosis not present

## 2019-06-30 DIAGNOSIS — D472 Monoclonal gammopathy: Secondary | ICD-10-CM

## 2019-06-30 DIAGNOSIS — R609 Edema, unspecified: Secondary | ICD-10-CM | POA: Insufficient documentation

## 2019-06-30 LAB — FOLATE: Folate: 23.6 ng/mL (ref >5.9)

## 2019-06-30 LAB — LACTATE DEHYDROGENASE: LDH: 74 U/L — ABNORMAL LOW (ref 98–192)

## 2019-06-30 NOTE — Assessment & Plan Note (Signed)
1.  IgG lambda monoclonal gammopathy: -Work-up for polyneuropathy showed SPEP with an M spike of 0.4 g/dL.  Immunofixation showed IgG lambda monoclonal protein. -Denies any new onset bone pains.  Labs from June 17, 2019 shows normal creatinine, calcium and hemoglobin of 12.5. -We will do further work-up including free light chain assay, 24-hour urine for total protein, UPEP, urine immunofixation.  We will also check LDH and beta-2 microglobulin levels along with uric acid. -We will obtain skeletal survey to evaluate for any lytic lesions.  Based on the above results, further work-up includes a bone marrow biopsy.  2.  Moderate thrombocytopenia: -CBC on June 17, 2019 showed platelet count of 95. -Evaluation of CBC over the last several years showed he had mild to moderate thrombocytopenia. -Denies any easy bruising or bleeding.  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 -His sister and mother had cancers. -We will do further work-up including nutritional deficiency work-up by checking methylmalonic acid, folic acid, copper levels.  We will also check ANA, rheumatoid factor, hepatitis B and C serology.

## 2019-06-30 NOTE — Progress Notes (Signed)
CONSULT NOTE  Patient Care Team: Theodoro Clock as PCP - General (Physician Assistant) Larey Dresser, MD as Consulting Physician (Cardiology) Irene Shipper, MD as Consulting Physician (Gastroenterology)  CHIEF COMPLAINTS/PURPOSE OF CONSULTATION: Polyneuropathy and M spike  HISTORY OF PRESENTING ILLNESS:  [name] 78 y.o. male is here because of polyneuropathy and M spike.  Patient was referred by his PCP to a neurologist for his polyneuropathy.  Neurologist did a full work-up and found the patient had a M spike of 0.4 g/dL.  Patient was then sent here by his neurologist.  Patient reports he is newly diagnosed with diabetes and was placed on Metformin 6 to 8 weeks ago and has lost 20 pounds.  Patient denies any fatigue, night sweats, chills, or fever.  Patient denies any history of blood clots.  Patient does report a change in vision over the last 6 months.  He will be seeing eye doctor by the end of the month.  Patient has bilateral lower leg pitting edema. He denies recent chest pain on exertion, shortness of breath on minimal exertion, pre-syncopal episodes, or palpitations. He had not noticed any recent bleeding such as epistaxis, hematuria or hematochezia He had no prior history or diagnosis of cancer. His age appropriate screening programs are up-to-date. He has a family history of his sister died of unknown cancer.  He also had twin siblings that died at 48 months of age for unknown causes. 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  MEDICAL HISTORY:  Past Medical History:  Diagnosis Date  . 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 Diabetes ([place])   . Diverticulosis   . Hepatomegaly   . Hx of colonic polyps   . Obesity, unspecified   . Obstructive sleep apnea    Uses CPAP.   Marland Kitchen Other and unspecified hyperlipidemia   .  Unspecified essential hypertension     SURGICAL HISTORY: Past Surgical History:  Procedure Laterality Date  . COLONOSCOPY  9/17/20012  . CORONARY STENT PLACEMENT  08/2006   LAD  . POLYPECTOMY  09/17/20012  . TONSILLECTOMY      SOCIAL HISTORY: Social History   Socioeconomic History  . Marital status: Married    Spouse name: Not on file  . Number of children: 1  . Years of education: 12 years  . Highest education level: Some college, no degree  Occupational History  . Occupation: Nurse, learning disability    Comment: Retired   . 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 Financial resource strain: Somewhat hard  . Food insecurity    Worry: Never true    Inability: Never true  . Transportation needs    Medical: No    Non-medical: No  Tobacco Use  . Smoking status: Former Smoker    Packs/day: 1.00    Types: Cigarettes    Quit date: 03/06/1971    Years since quitting: 48.3  . Smokeless tobacco: Former Systems developer    Types: [place] date: 07/30/1989  Substance and Sexual Activity  . Alcohol use: Yes    Alcohol/week: 2.0 standard drinks    Types: 2 Cans of beer per week    Comment: 2 beers/day  . Drug use: No  . Sexual activity: Never  Lifestyle  . Physical activity  Days per week: 0 days    Minutes per session: 0 min  . Stress: Not at all  Relationships  . Social connections    Talks on phone: More than three times a week    Gets together: Three times a week    Attends religious service: More than 4 times per year    Active member of club or organization: No    Attends meetings of clubs or organizations: Never    Relationship status: Married  . Intimate partner violence    Fear of current or ex partner: No    Emotionally abused: No    Physically abused: No    Forced sexual activity: No  Other Topics Concern  . 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 He is married  and lives at home with his wife. He has one adult son. Their teenage granddaughter has recently moved in with them.    Right handed    Caffeine~ does not use     FAMILY HISTORY: Family History  Problem Relation Age of Onset  . Heart disease Father        and Multiple family members on father Side   . Heart attack Father   . Cancer Mother   . Hypertension Mother   . Hypertension Sister   . Healthy Son   . Irritable bowel syndrome Maternal Grandmother   . Colon cancer Neg Hx     ALLERGIES:  has No Known Allergies.  MEDICATIONS:  Current Outpatient Medications  Medication Sig Dispense Refill  . atorvastatin (LIPITOR) 40 MG tablet TAKE 1 TABLET 2 TIMES A WEEK 24 tablet 1  . Blood Glucose Monitoring Suppl (ONETOUCH VERIO REFLECT) w/Device KIT See admin instructions.    . cyanocobalamin 1000 MCG tablet Take 1,000 mcg by mouth daily.    Marland Kitchen glipiZIDE (GLUCOTROL) 10 MG tablet Take 1 tablet (10 mg total) by mouth daily before breakfast. 30 tablet 5  . lisinopril (PRINIVIL,ZESTRIL) 40 MG tablet Take 1 tablet (40 mg total) by mouth daily. 30 tablet 5  . MAGNESIUM PO Take 420 mg by mouth.    . metFORMIN (GLUCOPHAGE) 500 MG tablet Take 1 tablet (500 mg total) by mouth 2 (two) times daily with a meal. 180 tablet 3  . metoprolol succinate (TOPROL-XL) 50 MG 24 hr tablet Take 1 tablet (50 mg total) by mouth daily. Take with or immediately following a meal. 90 tablet 3  . Multiple Vitamins-Minerals (CENTRUM SILVER PO) Take 1 capsule by mouth daily.      [name] Delica Lancets 59F MISC TEST 3 TIME DAILY 100 each 1  . ONETOUCH VERIO test strip 100 each by Other route 3 (three) times daily. for testing R73.9 100 each 1  . rivaroxaban (XARELTO) 20 MG TABS tablet Take 1 tablet (20 mg total) by mouth daily with supper. 30 tablet 11  . tamsulosin (FLOMAX) 0.4 MG CAPS capsule Take 1 capsule (0.4 mg total) by mouth at bedtime. 30 capsule 3  . nitroGLYCERIN (NITROSTAT) 0.4 MG SL tablet DISSOLVE 1 TABLET  UNDER TONGUE AS NEEDED FOR CHEST PAIN (Patient not taking: Reported on 06/30/2019) 25 tablet prn   No current facility-administered medications for this visit.     REVIEW OF SYSTEMS:   Constitutional: Denies fevers, chills or abnormal night sweats, + weight loss Eyes: + blurriness of vision, double vision or watery eyes Ears, nose, mouth, throat, and face: Denies mucositis or  sore throat Respiratory: Denies cough, dyspnea or wheezes Cardiovascular: Denies palpitation or chest discomfort. +lower extremity swelling Gastrointestinal:  Denies nausea, heartburn or change in bowel habits Skin: Denies abnormal skin rashes Lymphatics: Denies new lymphadenopathy or easy bruising Neurological:+ numbness, tingling or new weaknesses Behavioral/Psych: Mood is stable, no new changes  All other systems were reviewed with the patient and are negative.  PHYSICAL EXAMINATION: ECOG PERFORMANCE STATUS: 1 - Symptomatic but completely ambulatory  Vitals:   06/30/19 1128  BP: (!) 131/59  Pulse: (!) 58  Resp: 18  Temp: 98 F (36.7 C)  SpO2: 99%   Filed Weights   06/30/19 1128  Weight: 218 lb (98.9 kg)    GENERAL:alert, no distress and comfortable SKIN: skin color, texture, turgor are normal, no rashes or significant lesions EYES: normal, conjunctiva are pink and non-injected, sclera clear OROPHARYNX:no exudate, no erythema and lips, buccal mucosa, and tongue normal  NECK: supple, thyroid normal size, non-tender, without nodularity LYMPH:  no palpable lymphadenopathy in the cervical, axillary or inguinal LUNGS: clear to auscultation and percussion with normal breathing effort HEART: regular rate & rhythm and no murmurs and + lower extremity edema ABDOMEN:abdomen soft, non-tender and normal bowel sounds Musculoskeletal:no cyanosis of digits and no clubbing  PSYCH: alert & oriented x 3 with fluent speech NEURO: no focal motor/sensory deficits  LABORATORY DATA:  I have reviewed the data as  listed Recent Results (from the past 2160 hour(s))  Bayer DCA Hb A1c Waived     Status: Abnormal   Collection Time: 05/11/19  2:08 PM  Result Value Ref Range   HB A1C (BAYER DCA - WAIVED) >14.0 (H) <7.0 %    Comment:                                       Diabetic Adult            <7.0                                       Healthy Adult        4.3 - 5.7                                                           (DCCT/NGSP) American Diabetes Association's Summary of Glycemic Recommendations for Adults with Diabetes: Hemoglobin A1c <7.0%. More stringent glycemic goals (A1c <6.0%) may further reduce complications at the cost of increased risk of hypoglycemia.   Glucose Hemocue Waived     Status: Abnormal   Collection Time: 05/11/19  2:08 PM  Result Value Ref Range   Glu Hemocue Waived >444 (HH) 65 - 99 mg/dL  Novel Coronavirus, NAA ([facility])     Status: None   Collection Time: 05/18/19 12:40 PM   Specimen: Nasopharyngeal(NP) swabs in vial transport medium   NASOPHARYNGE  TESTING  Result Value Ref Range   SARS-CoV-2, NAA Not Detected Not Detected    Comment: This nucleic acid amplification test was developed and its performance characteristics determined by [employer], [employer] and [employer]. Nucleic acid amplification tests include PCR and TMA. This test has not been FDA cleared or approved. This test has  been authorized by FDA under an Emergency Use Authorization (EUA). This test is only authorized for the duration of time the declaration that circumstances exist justifying the authorization of the emergency use of in vitro diagnostic tests for detection of SARS-CoV-2 virus and/or diagnosis of COVID-19 infection under section 564(b)(1) of the Act, 21 U.S.C. 269SWN-4(O) (1), unless the authorization is terminated or revoked sooner. When diagnostic testing is negative, the possibility of a false negative result should be considered in the context of a patient's recent exposures and the presence of  clinical signs and symptoms consistent with COVID-19. An individual without symptoms of COVID-19 and who is not shedding SARS-CoV-2 virus would  expect to have a negative (not detected) result in this assay.   Vitamin B12     Status: None   Collection Time: 06/17/19 11:40 AM  Result Value Ref Range   Vitamin B-12 726 232 - 1,245 pg/mL  Vitamin D, 25-hydroxy     Status: None   Collection Time: 06/17/19 11:40 AM  Result Value Ref Range   Vit D, 25-Hydroxy 35.4 30.0 - 100.0 ng/mL    Comment: Vitamin D deficiency has been defined by the [place] practice guideline as a level of serum 25-OH vitamin D less than 20 ng/mL (1,2). The Endocrine Society went on to further define vitamin D insufficiency as a level between 21 and 29 ng/mL (2). 1. IOM (Institute of Medicine). 2010. Dietary reference    intakes for calcium and D. [place]: The    [employer]. 2. Holick MF, Binkley [place], Bischoff-Ferrari HA, et al.    Evaluation, treatment, and prevention of vitamin D    deficiency: an Endocrine Society clinical practice    guideline. JCEM. 2011 Jul; 96(7):1911-30.   Multiple Myeloma Panel (SPEP&IFE w/QIG)     Status: Abnormal   Collection Time: 06/17/19 11:40 AM  Result Value Ref Range   IgG (Immunoglobin G), Serum 1,114 603 - 1,613 mg/dL   IgA/Immunoglobulin A, Serum 423 61 - 437 mg/dL   IgM (Immunoglobulin M), Srm 47 15 - 143 mg/dL   Albumin SerPl Elph-Mcnc 3.5 2.9 - 4.4 g/dL   Alpha 1 0.3 0.0 - 0.4 g/dL   Alpha2 Glob SerPl Elph-Mcnc 0.5 0.4 - 1.0 g/dL   B-Globulin SerPl Elph-Mcnc 1.1 0.7 - 1.3 g/dL   Gamma Glob SerPl Elph-Mcnc 0.9 0.4 - 1.8 g/dL   M Protein SerPl Elph-Mcnc 0.4 (H) Not Observed g/dL   Globulin, Total 2.9 2.2 - 3.9 g/dL   Albumin/Glob SerPl 1.3 0.7 - 1.7   IFE 1 Comment (A)     Comment: Immunofixation shows IgG monoclonal protein with lambda light chain specificity. Polyclonal increase detected in one or more  immunoglobulins.    Please Note Comment     Comment: Protein electrophoresis scan will follow via computer, mail, or courier delivery.   Sjogren's syndrome antibods(ssa + ssb)     Status: None   Collection Time: 06/17/19 11:40 AM  Result Value Ref Range   ENA SSA (RO) Ab <0.2 0.0 - 0.9 AI   ENA SSB (LA) Ab <0.2 0.0 - 0.9 AI  Rheumatoid factor     Status: None   Collection Time: 06/17/19 11:40 AM  Result Value Ref Range   Rhuematoid fact SerPl-aCnc 10.0 0.0 - 13.9 IU/mL  CBC with Differential/Platelets     Status: Abnormal   Collection Time: 06/17/19 11:40 AM  Result Value Ref Range   WBC 5.5 3.4 - 10.8 x10E3/uL  RBC 3.56 (L) 4.14 - 5.80 x10E6/uL   Hemoglobin 12.5 (L) 13.0 - 17.7 g/dL   Hematocrit 35.5 (L) 37.5 - 51.0 %   MCV 100 (H) 79 - 97 fL   MCH 35.1 (H) 26.6 - 33.0 pg   MCHC 35.2 31.5 - 35.7 g/dL   RDW 11.6 11.6 - 15.4 %   Platelets 95 (LL) 150 - 450 x10E3/uL    Comment: Actual platelet count may be somewhat higher than reported due to aggregation of platelets in this sample.    Neutrophils 64 Not Estab. %   Lymphs 23 Not Estab. %   Monocytes 10 Not Estab. %   Eos 2 Not Estab. %   Basos 1 Not Estab. %   Neutrophils Absolute 3.5 1.4 - 7.0 x10E3/uL   Lymphocytes Absolute 1.2 0.7 - 3.1 x10E3/uL   Monocytes Absolute 0.6 0.1 - 0.9 x10E3/uL   EOS (ABSOLUTE) 0.1 0.0 - 0.4 x10E3/uL   Basophils Absolute 0.1 0.0 - 0.2 x10E3/uL   Immature Granulocytes 0 Not Estab. %   Immature Grans (Abs) 0.0 0.0 - 0.1 x10E3/uL   Hematology Comments: Note:     Comment: Verified by microscopic examination.  CMP     Status: Abnormal   Collection Time: 06/17/19 11:40 AM  Result Value Ref Range   Glucose 89 65 - 99 mg/dL   BUN 11 8 - 27 mg/dL   Creatinine, Ser 0.85 0.76 - 1.27 mg/dL   GFR calc non Af Amer 84 >59 mL/min/1.73   GFR calc Af Amer 97 >59 mL/min/1.73   BUN/Creatinine Ratio 13 10 - 24   Sodium 144 134 - 144 mmol/L   Potassium 4.6 3.5 - 5.2 mmol/L   Chloride 107 (H) 96 - 106  mmol/L   CO2 25 20 - 29 mmol/L   Calcium 9.9 8.6 - 10.2 mg/dL   Total Protein 6.4 6.0 - 8.5 g/dL   Albumin 3.9 3.7 - 4.7 g/dL   Globulin, Total 2.5 1.5 - 4.5 g/dL   Albumin/Globulin Ratio 1.6 1.2 - 2.2   Bilirubin Total 0.8 0.0 - 1.2 mg/dL   Alkaline Phosphatase 119 (H) 39 - 117 IU/L   AST 28 0 - 40 IU/L   ALT 18 0 - 44 IU/L  Folate     Status: None   Collection Time: 06/30/19  1:19 PM  Result Value Ref Range   Folate 23.6 >5.9 ng/mL    Comment: RESULTS CONFIRMED BY MANUAL DILUTION Performed at [facility], [address] [place], [place] [address]   Lactate dehydrogenase     Status: Abnormal   Collection Time: 06/30/19  1:19 PM  Result Value Ref Range   LDH 74 (L) 98 - 192 U/L    Comment: Performed at [facility], [address] [place], [place] [address]    RADIOGRAPHIC STUDIES: I have personally reviewed the radiological images as listed and agreed with the findings in the report.  ASSESSMENT & PLAN:  MGUS (monoclonal gammopathy of unknown significance) 1.  IgG lambda monoclonal gammopathy: -Work-up for polyneuropathy showed SPEP with an M spike of 0.4 g/dL.  Immunofixation showed IgG lambda monoclonal protein. -Denies any new onset bone pains.  Labs from June 17, 2019 shows normal creatinine, calcium and hemoglobin of 12.5. -We will do further work-up including free light chain assay, 24-hour urine for total protein, UPEP, urine immunofixation.  We will also check LDH and beta-2 microglobulin levels along with uric acid. -We will obtain skeletal survey to evaluate for any lytic lesions.  Based on the above results, further work-up includes a bone marrow biopsy.  2.  Moderate thrombocytopenia: -CBC on June 17, 2019 showed platelet count of 95. -Evaluation of CBC over the last several years showed he had mild to moderate thrombocytopenia. -Denies any easy bruising or bleeding.  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 -His sister and  mother had cancers. -We will do further work-up including nutritional deficiency work-up by checking methylmalonic acid, folic acid, copper levels.  We will also check ANA, rheumatoid factor, hepatitis B and C serology.  I have independently reviewed history and examined the patient.  HPI and physical exam and the above note was documented by my nurse practitioner Wenda Low.  I agree with the HPI.  Assessment and plan was formulated by both of [place] with discussion.   All questions were answered. The patient knows to call the clinic with any problems, questions or concerns.      Derek Jack, MD 06/30/19 6:23 PM

## 2019-07-01 DIAGNOSIS — R2689 Other abnormalities of gait and mobility: Secondary | ICD-10-CM | POA: Diagnosis not present

## 2019-07-01 DIAGNOSIS — G4733 Obstructive sleep apnea (adult) (pediatric): Secondary | ICD-10-CM | POA: Diagnosis not present

## 2019-07-01 DIAGNOSIS — R609 Edema, unspecified: Secondary | ICD-10-CM | POA: Diagnosis not present

## 2019-07-01 DIAGNOSIS — I10 Essential (primary) hypertension: Secondary | ICD-10-CM | POA: Diagnosis not present

## 2019-07-01 DIAGNOSIS — E1140 Type 2 diabetes mellitus with diabetic neuropathy, unspecified: Secondary | ICD-10-CM | POA: Diagnosis not present

## 2019-07-01 DIAGNOSIS — R42 Dizziness and giddiness: Secondary | ICD-10-CM | POA: Diagnosis not present

## 2019-07-01 DIAGNOSIS — D472 Monoclonal gammopathy: Secondary | ICD-10-CM | POA: Diagnosis not present

## 2019-07-01 DIAGNOSIS — C9000 Multiple myeloma not having achieved remission: Secondary | ICD-10-CM | POA: Diagnosis not present

## 2019-07-01 DIAGNOSIS — E785 Hyperlipidemia, unspecified: Secondary | ICD-10-CM | POA: Diagnosis not present

## 2019-07-01 DIAGNOSIS — D696 Thrombocytopenia, unspecified: Secondary | ICD-10-CM | POA: Diagnosis not present

## 2019-07-01 LAB — IGG, IGA, IGM
IgA: 418 mg/dL (ref 61–437)
IgG (Immunoglobin G), Serum: 1125 mg/dL (ref 603–1613)
IgM (Immunoglobulin M), Srm: 45 mg/dL (ref 15–143)

## 2019-07-01 LAB — PROTEIN ELECTROPHORESIS, SERUM
A/G Ratio: 1.2 (ref 0.7–1.7)
Albumin ELP: 3.6 g/dL (ref 2.9–4.4)
Alpha-1-Globulin: 0.2 g/dL (ref 0.0–0.4)
Alpha-2-Globulin: 0.5 g/dL (ref 0.4–1.0)
Beta Globulin: 1.1 g/dL (ref 0.7–1.3)
Gamma Globulin: 1.1 g/dL (ref 0.4–1.8)
Globulin, Total: 3 g/dL (ref 2.2–3.9)
M-Spike, %: 0.5 g/dL — ABNORMAL HIGH
Total Protein ELP: 6.6 g/dL (ref 6.0–8.5)

## 2019-07-01 LAB — ANTINUCLEAR ANTIBODIES, IFA: ANA Ab, IFA: NEGATIVE

## 2019-07-01 LAB — HEPATITIS PANEL, ACUTE
HCV Ab: NONREACTIVE
Hep A IgM: NONREACTIVE
Hep B C IgM: NONREACTIVE
Hepatitis B Surface Ag: NONREACTIVE

## 2019-07-01 LAB — KAPPA/LAMBDA LIGHT CHAINS
Kappa free light chain: 50.1 mg/L — ABNORMAL HIGH (ref 3.3–19.4)
Kappa, lambda light chain ratio: 1.2 (ref 0.26–1.65)
Lambda free light chains: 41.8 mg/L — ABNORMAL HIGH (ref 5.7–26.3)

## 2019-07-01 LAB — BETA 2 MICROGLOBULIN, SERUM: Beta-2 Microglobulin: 3.1 mg/L — ABNORMAL HIGH (ref 0.6–2.4)

## 2019-07-01 LAB — RHEUMATOID FACTOR: Rheumatoid fact SerPl-aCnc: <10 IU/mL (ref 0.0–13.9)

## 2019-07-02 DIAGNOSIS — R69 Illness, unspecified: Secondary | ICD-10-CM | POA: Diagnosis not present

## 2019-07-02 LAB — IMMUNOFIXATION ELECTROPHORESIS
IgA: 414 mg/dL (ref 61–437)
IgG (Immunoglobin G), Serum: 1115 mg/dL (ref 603–1613)
IgM (Immunoglobulin M), Srm: 46 mg/dL (ref 15–143)
Total Protein ELP: 6.3 g/dL (ref 6.0–8.5)

## 2019-07-02 LAB — COPPER, SERUM: Copper: 116 ug/dL (ref 72–166)

## 2019-07-03 ENCOUNTER — Other Ambulatory Visit: Payer: Self-pay

## 2019-07-03 DIAGNOSIS — R69 Illness, unspecified: Secondary | ICD-10-CM | POA: Diagnosis not present

## 2019-07-03 DIAGNOSIS — Z20822 Contact with and (suspected) exposure to covid-19: Secondary | ICD-10-CM

## 2019-07-03 LAB — METHYLMALONIC ACID, SERUM: Methylmalonic Acid, Quantitative: 166 nmol/L (ref 0–378)

## 2019-07-05 ENCOUNTER — Other Ambulatory Visit: Payer: Medicare HMO

## 2019-07-06 LAB — UPEP/UIFE/LIGHT CHAINS/TP, 24-HR UR
% BETA, Urine: 0 %
ALPHA 1 URINE: 0 %
Albumin, U: 0 %
Alpha 2, Urine: 0 %
Free Kappa Lt Chains,Ur: 2.87 mg/L (ref 0.63–113.79)
Free Kappa/Lambda Ratio: >4.48 (ref 1.03–31.76)
Free Lambda Lt Chains,Ur: <0.64 mg/L (ref 0.47–11.77)
GAMMA GLOBULIN URINE: 0 %
Total Protein, Urine-Ur/day: <80 mg/24 hr (ref 30–150)
Total Protein, Urine: <4 mg/dL

## 2019-07-06 LAB — NOVEL CORONAVIRUS, NAA: SARS-CoV-2, NAA: NOT DETECTED

## 2019-07-15 ENCOUNTER — Inpatient Hospital Stay (HOSPITAL_BASED_OUTPATIENT_CLINIC_OR_DEPARTMENT_OTHER): Payer: Medicare HMO | Admitting: Hematology

## 2019-07-15 ENCOUNTER — Encounter (HOSPITAL_COMMUNITY): Payer: Self-pay | Admitting: Hematology

## 2019-07-15 ENCOUNTER — Ambulatory Visit (HOSPITAL_COMMUNITY)
Admission: RE | Admit: 2019-07-15 | Discharge: 2019-07-15 | Disposition: A | Discharge status: Home / Self Care | Payer: Medicare HMO | Source: Ambulatory Visit | Attending: Nurse Practitioner | Admitting: Nurse Practitioner

## 2019-07-15 ENCOUNTER — Other Ambulatory Visit: Payer: Self-pay

## 2019-07-15 VITALS — BP 136/44 | HR 67 | Temp 98.9°F | Resp 18 | Wt 212.0 lb

## 2019-07-15 DIAGNOSIS — I10 Essential (primary) hypertension: Secondary | ICD-10-CM | POA: Diagnosis not present

## 2019-07-15 DIAGNOSIS — E785 Hyperlipidemia, unspecified: Secondary | ICD-10-CM | POA: Diagnosis not present

## 2019-07-15 DIAGNOSIS — E1140 Type 2 diabetes mellitus with diabetic neuropathy, unspecified: Secondary | ICD-10-CM | POA: Diagnosis not present

## 2019-07-15 DIAGNOSIS — G4733 Obstructive sleep apnea (adult) (pediatric): Secondary | ICD-10-CM | POA: Diagnosis not present

## 2019-07-15 DIAGNOSIS — R42 Dizziness and giddiness: Secondary | ICD-10-CM | POA: Diagnosis not present

## 2019-07-15 DIAGNOSIS — R2689 Other abnormalities of gait and mobility: Secondary | ICD-10-CM | POA: Diagnosis not present

## 2019-07-15 DIAGNOSIS — R609 Edema, unspecified: Secondary | ICD-10-CM | POA: Diagnosis not present

## 2019-07-15 DIAGNOSIS — D696 Thrombocytopenia, unspecified: Secondary | ICD-10-CM | POA: Diagnosis not present

## 2019-07-15 DIAGNOSIS — C9000 Multiple myeloma not having achieved remission: Secondary | ICD-10-CM | POA: Diagnosis not present

## 2019-07-15 DIAGNOSIS — D472 Monoclonal gammopathy: Secondary | ICD-10-CM

## 2019-07-15 NOTE — Progress Notes (Signed)
[name],  [id number]   CLINIC:  Medical Oncology/Hematology  PCP:  [name], PA-C [place] [address] [phone]   REASON FOR VISIT:  Follow-up for MGUS and thrombocytopenia.  CURRENT THERAPY: Observation.   INTERVAL HISTORY:  [name] 78 y.o. male seen for follow-up of thrombocytopenia and a monoclonal gammopathy.  Denies any new onset pains.  Reports some balance problems and dizziness.  Numbness in the hands and feet has been stable.  Appetite is 100%.  Energy levels are 50%.  No pain is reported.  No recurrent infections or hospitalizations.  Denies any joint pains with swelling.    REVIEW OF SYSTEMS:  Review of Systems  Neurological: Positive for dizziness and numbness.  All other systems reviewed and are negative.    PAST MEDICAL/SURGICAL HISTORY:  Past Medical History:  Diagnosis Date  . 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 Diabetes ([place])   . Diverticulosis   . Hepatomegaly   . Hx of colonic polyps   . Obesity, unspecified   . Obstructive sleep apnea    Uses CPAP.   Marland Kitchen Other and unspecified hyperlipidemia   . Unspecified essential hypertension    Past Surgical History:  Procedure Laterality Date  . COLONOSCOPY  9/17/20012  . CORONARY STENT PLACEMENT  08/2006   LAD  . POLYPECTOMY  09/17/20012  . TONSILLECTOMY       SOCIAL HISTORY:  Social History   Socioeconomic History  . Marital status: Married    Spouse name: Not on file  . Number of children: 1  . Years of education: 12 years  . Highest education level: Some college, no degree  Occupational History  . Occupation: Nurse, learning disability    Comment: Retired   . 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 Smoking status: Former Smoker    Packs/day: 1.00    Types: Cigarettes    Quit date: 03/06/1971    Years since quitting: 48.3  . Smokeless tobacco: Former  Systems developer    Types: [place] date: 07/30/1989  Substance and Sexual Activity  . Alcohol use: Yes    Alcohol/week: 2.0 standard drinks    Types: 2 Cans of beer per week    Comment: 2 beers/day  . Drug use: No  . Sexual activity: Never  Other Topics Concern  . 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 He is married and lives at home with his wife. He has one adult son. Their teenage granddaughter has recently moved in with them.    Right handed    Caffeine~ does not use    Social Determinants of Health   Financial Resource Strain: Medium Risk  . Difficulty of Paying Living Expenses: Somewhat hard  Food Insecurity:   . Worried About Charity fundraiser in the Last Year: Not on file  . Ran Out of Food in the Last Year: Not on file  Transportation Needs:   . Lack of Transportation (Medical): Not on file  . Lack of Transportation (Non-Medical): Not on file  Physical Activity: Inactive  . Days of Exercise per Week: 0 days  . Minutes of Exercise per Session: 0 min  Stress: No Stress Concern Present  . Feeling of Stress : Not at all  Social Connections:  Unknown  . Frequency of Communication with Friends and Family: Not on file  . Frequency of Social Gatherings with Friends and Family: Three times a week  . Attends Religious Services: Not on file  . Active Member of Clubs or Organizations: Not on file  . Attends Archivist Meetings: Not on file  . Marital Status: Not on file  Intimate Partner Violence:   . Fear of Current or Ex-Partner: Not on file  . Emotionally Abused: Not on file  . Physically Abused: Not on file  . Sexually Abused: Not on file    FAMILY HISTORY:  Family History  Problem Relation Age of Onset  . Heart disease Father        and Multiple family members on father Side   . Heart attack Father   . Cancer Mother   . Hypertension Mother   . Hypertension Sister   . Healthy Son   .  Irritable bowel syndrome Maternal Grandmother   . Colon cancer Neg Hx     CURRENT MEDICATIONS:  Outpatient Encounter Medications as of 07/15/2019  Medication Sig  . atorvastatin (LIPITOR) 40 MG tablet TAKE 1 TABLET 2 TIMES A WEEK  . Blood Glucose Monitoring Suppl (ONETOUCH VERIO REFLECT) w/Device KIT See admin instructions.  . cyanocobalamin 1000 MCG tablet Take 1,000 mcg by mouth daily.  Marland Kitchen glipiZIDE (GLUCOTROL) 10 MG tablet Take 1 tablet (10 mg total) by mouth daily before breakfast.  . lisinopril (PRINIVIL,ZESTRIL) 40 MG tablet Take 1 tablet (40 mg total) by mouth daily.  Marland Kitchen MAGNESIUM PO Take 420 mg by mouth.  . metFORMIN (GLUCOPHAGE) 500 MG tablet Take 1 tablet (500 mg total) by mouth 2 (two) times daily with a meal.  . metoprolol succinate (TOPROL-XL) 50 MG 24 hr tablet Take 1 tablet (50 mg total) by mouth daily. Take with or immediately following a meal.  . Multiple Vitamins-Minerals (CENTRUM SILVER PO) Take 1 capsule by mouth daily.    [name] Delica Lancets 57Q MISC TEST 3 TIME DAILY  . ONETOUCH VERIO test strip 100 each by Other route 3 (three) times daily. for testing R73.9  . rivaroxaban (XARELTO) 20 MG TABS tablet Take 1 tablet (20 mg total) by mouth daily with supper.  . tamsulosin (FLOMAX) 0.4 MG CAPS capsule Take 1 capsule (0.4 mg total) by mouth at bedtime.  . nitroGLYCERIN (NITROSTAT) 0.4 MG SL tablet DISSOLVE 1 TABLET UNDER TONGUE AS NEEDED FOR CHEST PAIN (Patient not taking: Reported on 06/30/2019)   No facility-administered encounter medications on file as of 07/15/2019.    ALLERGIES:  No Known Allergies   PHYSICAL EXAM:  ECOG Performance status: 1  Vitals:   07/15/19 1345  BP: (!) 136/44  Pulse: 67  Resp: 18  Temp: 98.9 F (37.2 C)  SpO2: 96%   Filed Weights   07/15/19 1345  Weight: 212 lb (96.2 kg)    Physical Exam Vitals reviewed.  Constitutional:      Appearance: Normal appearance.  Cardiovascular:     Rate and Rhythm: Normal rate and regular  rhythm.     Heart sounds: Normal heart sounds.  Pulmonary:     Effort: Pulmonary effort is normal.     Breath sounds: Normal breath sounds.  Abdominal:     General: There is no distension.     Palpations: Abdomen is soft. There is no mass.  Skin:    General: Skin is warm.  Neurological:     General: No focal deficit present.  Mental Status: He is alert and oriented to person, place, and time.  Psychiatric:        Mood and Affect: Mood normal.        Behavior: Behavior normal.      LABORATORY DATA:  I have reviewed the labs as listed.  CBC    Component Value Date/Time   WBC 5.5 06/17/2019 1140   WBC 9.2 09/09/2014 1451   RBC 3.56 (L) 06/17/2019 1140   RBC 4.09 (L) 09/09/2014 1451   HGB 12.5 (L) 06/17/2019 1140   HCT 35.5 (L) 06/17/2019 1140   PLT 95 (LL) 06/17/2019 1140   MCV 100 (H) 06/17/2019 1140   MCH 35.1 (H) 06/17/2019 1140   MCH 35.3 (A) 11/11/2012 1008   MCH 33.4 06/20/2010 0337   MCHC 35.2 06/17/2019 1140   MCHC 34.5 09/09/2014 1451   RDW 11.6 06/17/2019 1140   LYMPHSABS 1.2 06/17/2019 1140   MONOABS 0.8 09/09/2014 1451   EOSABS 0.1 06/17/2019 1140   BASOSABS 0.1 06/17/2019 1140   CMP Latest Ref Rng & Units 06/17/2019 08/04/2018 01/29/2018  Glucose 65 - 99 mg/dL 89 151(H) 129(H)  BUN 8 - 27 mg/dL '11 8 9  '$ Creatinine 0.76 - 1.27 mg/dL 0.85 0.83 0.93  Sodium 134 - 144 mmol/L 144 138 137  Potassium 3.5 - 5.2 mmol/L 4.6 3.9 4.5  Chloride 96 - 106 mmol/L 107(H) 100 98  CO2 20 - 29 mmol/L '25 25 25  '$ Calcium 8.6 - 10.2 mg/dL 9.9 9.4 9.4  Total Protein 6.0 - 8.5 g/dL 6.4 6.3 6.5  Total Bilirubin 0.0 - 1.2 mg/dL 0.8 1.7(H) 1.2  Alkaline Phos 39 - 117 IU/L 119(H) 145(H) 141(H)  AST 0 - 40 IU/L 28 60(H) 95(H)  ALT 0 - 44 IU/L 18 26 38       DIAGNOSTIC IMAGING:  I have independently reviewed the scans and discussed with the patient.     ASSESSMENT & PLAN:   MGUS (monoclonal gammopathy of unknown significance) 1.  IgG lambda monoclonal gammopathy:  -Labs on 06/30/2019 shows M spike of 0.5 g/dL.  Immunofixation shows IgG lambda monoclonal protein.  Free kappa light chains of 50.1, lambda light chains 41.8, ratio is normal at 1.20. -Hemoglobin was 12.5, creatinine 0.85, calcium 9.9. -24-hour urine total protein was less than 4.  UPEP was negative.  Immunofixation was also negative. -I have reviewed skeletal survey which did not show any lytic lesions.  We will follow up on the radiology report. -We talked about the normal pathophysiology of MGUS.  There is 1 %/year transformation to myeloma from MGUS. -We will see him back in 4 months with repeat myeloma panel.  If there is staying stable, we will consider switching him to every 64-monthvisits.  2.  Moderate thrombocytopenia: -CBC on June 17, 2019 showed platelet count of 95. -Denies any easy bleeding or bruising.  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  Hepatitis serology was normal.  ANA and rheumatoid factor was also normal. -Differential diagnosis includes MDS versus immune mediated thrombocytopenia.  No further work-up needed at this time. -We will see him back in 4 months with repeat blood counts.   Total time spent is 25 minutes of the time spent face-to-face discussing new diagnoses, prognosis, surveillance plan, counseling and coordination of care.  Orders placed this encounter:  Orders Placed This Encounter  Procedures  . CBC with Differential/Platelet  . Comprehensive metabolic  panel  . Protein electrophoresis, serum  . Kappa/lambda light chains  . Lactate dehydrogenase      Derek Jack, MD [place] [phone]

## 2019-07-15 NOTE — Assessment & Plan Note (Signed)
1.  IgG lambda monoclonal gammopathy: -Labs on 06/30/2019 shows M spike of 0.5 g/dL.  Immunofixation shows IgG lambda monoclonal protein.  Free kappa light chains of 50.1, lambda light chains 41.8, ratio is normal at 1.20. -Hemoglobin was 12.5, creatinine 0.85, calcium 9.9. -24-hour urine total protein was less than 4.  UPEP was negative.  Immunofixation was also negative. -I have reviewed skeletal survey which did not show any lytic lesions.  We will follow up on the radiology report. -We talked about the normal pathophysiology of MGUS.  There is 1 %/year transformation to myeloma from MGUS. -We will see him back in 4 months with repeat myeloma panel.  If there is staying stable, we will consider switching him to every 36-month visits.  2.  Moderate thrombocytopenia: -CBC on June 17, 2019 showed platelet count of 95. -Denies any easy bleeding or bruising.  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 -We checked for nutritional deficiencies which were within normal limits.  Hepatitis serology was normal.  ANA and rheumatoid factor was also normal. -Differential diagnosis includes MDS versus immune mediated thrombocytopenia.  No further work-up needed at this time. -We will see him back in 4 months with repeat blood counts.

## 2019-07-15 NOTE — Patient Instructions (Signed)
[facility] at [facility] Discharge Instructions  You were seen today by Dr. Katragadda. He went over your recent lab and scan results. He will see you back in 4 months for labs and follow up.   Thank you for choosing [facility] at [facility] to provide your oncology and hematology care.  To afford each patient quality time with our provider, please arrive at least 15 minutes before your scheduled appointment time.   If you have a lab appointment with the Cancer Center please come in thru the  Main Entrance and check in at the main information desk  You need to re-schedule your appointment should you arrive 10 or more minutes late.  We strive to give you quality time with our providers, and arriving late affects you and other patients whose appointments are after yours.  Also, if you no show three or more times for appointments you may be dismissed from the clinic at the providers discretion.     Again, thank you for choosing [facility].  Our hope is that these requests will decrease the amount of time that you wait before being seen by our physicians.       _____________________________________________________________  Should you have questions after your visit to [facility], please contact our office at [phone] between the hours of 8:00 a.m. and 4:30 p.m.  Voicemails left after 4:00 p.m. will not be returned until the following business day.  For prescription refill requests, have your pharmacy contact our office and allow 72 hours.    Cancer Center Support Programs:   > Cancer Support Group  2nd Tuesday of the month 1pm-2pm, Journey Room    

## 2019-07-20 ENCOUNTER — Other Ambulatory Visit: Payer: Self-pay | Admitting: Physician Assistant

## 2019-07-20 DIAGNOSIS — I10 Essential (primary) hypertension: Secondary | ICD-10-CM

## 2019-07-21 DIAGNOSIS — D6869 Other thrombophilia: Secondary | ICD-10-CM | POA: Diagnosis not present

## 2019-07-21 DIAGNOSIS — G4730 Sleep apnea, unspecified: Secondary | ICD-10-CM | POA: Diagnosis not present

## 2019-07-21 DIAGNOSIS — E785 Hyperlipidemia, unspecified: Secondary | ICD-10-CM | POA: Diagnosis not present

## 2019-07-21 DIAGNOSIS — E1142 Type 2 diabetes mellitus with diabetic polyneuropathy: Secondary | ICD-10-CM | POA: Diagnosis not present

## 2019-07-21 DIAGNOSIS — Z7901 Long term (current) use of anticoagulants: Secondary | ICD-10-CM | POA: Diagnosis not present

## 2019-07-21 DIAGNOSIS — I4891 Unspecified atrial fibrillation: Secondary | ICD-10-CM | POA: Diagnosis not present

## 2019-07-21 DIAGNOSIS — I10 Essential (primary) hypertension: Secondary | ICD-10-CM | POA: Diagnosis not present

## 2019-07-21 DIAGNOSIS — E1151 Type 2 diabetes mellitus with diabetic peripheral angiopathy without gangrene: Secondary | ICD-10-CM | POA: Diagnosis not present

## 2019-07-21 DIAGNOSIS — Z20828 Contact with and (suspected) exposure to other viral communicable diseases: Secondary | ICD-10-CM | POA: Diagnosis not present

## 2019-07-21 DIAGNOSIS — N400 Enlarged prostate without lower urinary tract symptoms: Secondary | ICD-10-CM | POA: Diagnosis not present

## 2019-07-22 ENCOUNTER — Telehealth: Payer: Self-pay | Admitting: Physician Assistant

## 2019-07-22 DIAGNOSIS — I10 Essential (primary) hypertension: Secondary | ICD-10-CM

## 2019-07-22 NOTE — Telephone Encounter (Signed)
Can you get the order ready?

## 2019-07-22 NOTE — Telephone Encounter (Signed)
Insurance said they would pay for a BP monitor. Patient wants it sent to [place]

## 2019-07-22 NOTE — Telephone Encounter (Signed)
On your desk to sign

## 2019-07-28 ENCOUNTER — Other Ambulatory Visit: Payer: Self-pay | Admitting: Physician Assistant

## 2019-07-28 NOTE — Telephone Encounter (Signed)
Aware BP order will be faxed today

## 2019-07-29 ENCOUNTER — Ambulatory Visit
Admission: RE | Admit: 2019-07-29 | Discharge: 2019-07-29 | Disposition: A | Discharge status: Home / Self Care | Payer: Medicare HMO | Source: Ambulatory Visit | Attending: Neurology | Admitting: Neurology

## 2019-07-29 DIAGNOSIS — R413 Other amnesia: Secondary | ICD-10-CM | POA: Diagnosis not present

## 2019-07-29 DIAGNOSIS — G3281 Cerebellar ataxia in diseases classified elsewhere: Secondary | ICD-10-CM | POA: Diagnosis not present

## 2019-08-03 ENCOUNTER — Telehealth: Payer: Self-pay | Admitting: *Deleted

## 2019-08-03 NOTE — Telephone Encounter (Signed)
-----   Message from Britt Bottom, MD sent at 07/31/2019  7:52 PM EST ----- Please let him know the MRi of the cervical spinal cord showed degenerative changes and some narrowing of the spinal canal but not severe enough to compress the spinal cord or affect his walking  The MRI of the brain showed mild atrophy and age related changes but nothing too bad.

## 2019-08-03 NOTE — Telephone Encounter (Signed)
Called and spoke with pt wife about results per Dr. [name] note. She verbalized understanding.

## 2019-08-04 ENCOUNTER — Other Ambulatory Visit: Payer: Self-pay | Admitting: Physician Assistant

## 2019-08-04 NOTE — Telephone Encounter (Signed)
Rx written & placed on provider's desk 

## 2019-08-05 ENCOUNTER — Other Ambulatory Visit: Payer: Self-pay | Admitting: Physician Assistant

## 2019-08-05 DIAGNOSIS — R69 Illness, unspecified: Secondary | ICD-10-CM | POA: Diagnosis not present

## 2019-08-07 DIAGNOSIS — R69 Illness, unspecified: Secondary | ICD-10-CM | POA: Diagnosis not present

## 2019-08-10 NOTE — Telephone Encounter (Signed)
Rx faxed on 08/07/19, pt aware

## 2019-08-11 ENCOUNTER — Ambulatory Visit (INDEPENDENT_AMBULATORY_CARE_PROVIDER_SITE_OTHER): Payer: Medicare HMO | Admitting: Neurology

## 2019-08-11 ENCOUNTER — Encounter: Payer: Self-pay | Admitting: Neurology

## 2019-08-11 ENCOUNTER — Ambulatory Visit: Payer: Medicare HMO | Admitting: Neurology

## 2019-08-11 ENCOUNTER — Other Ambulatory Visit: Payer: Self-pay

## 2019-08-11 DIAGNOSIS — G629 Polyneuropathy, unspecified: Secondary | ICD-10-CM

## 2019-08-11 DIAGNOSIS — R269 Unspecified abnormalities of gait and mobility: Secondary | ICD-10-CM

## 2019-08-11 DIAGNOSIS — R200 Anesthesia of skin: Secondary | ICD-10-CM

## 2019-08-11 DIAGNOSIS — D472 Monoclonal gammopathy: Secondary | ICD-10-CM

## 2019-08-11 NOTE — Progress Notes (Signed)
Full Name: [name] Gender: Male MRN #: [id number] Date of Birth: 1940/12/05    Visit Date: 08/11/2019 09:44 Age: 79 Years Examining Physician: Arlice Colt, MD  Referring Physician: Arlice Colt, MD  History: [name] is a 79 year old man with numbness in his legs that is severe in the feet and milder in the lower leg and also numbness in the fingers.  He has MGUS with an IgG lambda monoclonal protein.  On exam, he had no vibration sensation in the toes and about 20% vibration sensation at the ankles.  He had no sensation to touch in the feet and about a 10% sensation at the ankles relative to the knees.  Strength was 4+/5 in the toe extensors.  He also had numbness in the fingers but normal sensation in the hand and above.  Nerve conduction studies: The right ulnar motor response had a borderline normal distal latency and amplitude and a slightly reduced conduction velocity.  The right median motor response had a normal distal latency and a mildly reduced amplitude and conduction velocity.  The peroneal motor responses bilaterally had normal distal latencies and markedly reduced amplitude and moderately reduced conduction velocities.  Bilateral tibial motor responses were absent.  Right ulnar F-wave response was mildly delayed.  The right radial sensory response had a normal peak latency but severely reduced amplitude.  The right median sensory response had a mildly delayed peak latency with a markedly reduced amplitude.  The superficial peroneal, ulnar and sural sensory responses were absent.  Electromyography: Needle EMG of selected muscles of the right leg was performed.  There was moderately severe chronic denervation with mild acute denervation in the abductor hallucis and extensor digitorum brevis muscles of the foot.  Mild chronic denervation without any acute denervation was noted in the vastus medialis, peroneus longus, tibialis anterior and gastrocnemius muscles.  The  gluteus medius and iliopsoas muscles were normal.  Impression: This NCV/EMG study shows the following: 1.   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 2.   Possible mild superimposed median neuropathy at the wrist. 3.   No significant radiculopathy noted.  [name] [name], MD, PhD, FAAN Certified in Neurology, Clinical Neurophysiology, Sleep Medicine, Pain Medicine and Neuroimaging Director, [facility] [address], [place], [place] [address] [phone]   [facility]    Nerve / Sites Muscle Latency Ref. Amplitude Ref. Rel Amp Segments Distance Velocity Ref. Area    ms ms mV mV %  cm m/s m/s mVms  R Median - APB     Wrist APB 4.3 ?4.4 3.2 ?4.0 100 Wrist - APB 7   8.9     Upper arm APB 10.4  2.8  90 Upper arm - Wrist 25 41 ?49 8.8  R Ulnar - ADM     Wrist ADM 3.3 ?3.3 6.0 ?6.0 100 Wrist - ADM 7   19.8     B.Elbow ADM 8.1  7.5  124 B.Elbow - Wrist 22 45 ?49 24.3     A.Elbow ADM 10.4  7.1  95.4 A.Elbow - B.Elbow 10 45 ?49 24.0         A.Elbow - Wrist      R Peroneal - EDB     Ankle EDB 5.6 ?6.5 0.4 ?2.0 100 Ankle - EDB 9   1.4     Fib head EDB 18.6  0.2  53.4 Fib head -  Ankle 32 25 ?44 0.7     Pop fossa EDB 22.6  0.2  97.6 Pop fossa - Fib head 10 25 ?44 0.6         Pop fossa - Ankle      L Peroneal - EDB     Ankle EDB 5.4 ?6.5 0.7 ?2.0 100 Ankle - EDB 9   1.6     Fib head EDB 18.8  0.3  42.9 Fib head - Ankle 32 24 ?44 1.1     Pop fossa EDB 22.1  0.3  94.2 Pop fossa - Fib head 10 30 ?44 0.9         Pop fossa - Ankle      R Tibial - AH     Ankle AH NR ?5.8 NR ?4.0 NR Ankle - AH 9 NR  NR  L Tibial - AH     Ankle AH NR ?5.8 NR ?4.0 NR Ankle - AH 9 NR  NR                 SNC    Nerve / Sites Rec. Site Peak Lat Ref.  Amp Ref. Segments Distance    ms ms V V  cm  R Radial - Anatomical snuff box (Forearm)     Forearm Wrist 2.9 ?2.9 3 ?15  Forearm - Wrist 10  R Sural - Ankle (Calf)     Calf Ankle NR ?4.4 NR ?6 Calf - Ankle 14  R Superficial peroneal - Ankle     Lat leg Ankle NR ?4.4 NR ?6 Lat leg - Ankle 14  R Median - Orthodromic (Dig II, Mid palm)     Dig II Wrist 3.8 ?3.4 2 ?10 Dig II - Wrist 13  R Ulnar - Orthodromic, (Dig V, Mid palm)     Dig V Wrist NR ?3.1 NR ?5 Dig V - Wrist 43               F  Wave    Nerve F Lat Ref.   ms ms  R Ulnar - ADM 37.4 ?32.0       EMG Summary Table    Spontaneous MUAP Recruitment  Muscle IA Fib PSW Fasc Other Amp Dur. Poly Pattern  R. Vastus medialis Normal None None None _______ Increased Increased 2+ Reduced  R. Peroneus longus Normal None None None _______ Increased Increased 1+ Reduced  R. Tibialis anterior Normal None None None _______ Increased Increased 2+ Reduced  R. Gastrocnemius (Medial head) Normal None None None _______ Increased Increased 2+ Reduced  R. Abductor hallucis Normal 1+ None None _______ Normal Normal 2+ Discrete  R. Extensor digitorum brevis Normal 1+ None None _______ Normal Increased 2+ Discrete  R. Gluteus medius Normal None None None _______ Normal Normal 1+ Normal  R. Iliopsoas Normal None None None _______ Normal Normal Normal Normal

## 2019-08-11 NOTE — Progress Notes (Signed)
[facility]  PATIENT: [name] DOB: 07/25/1941  REFERRING DOCTOR OR PCP: Donaciano Eva, PA-C SOURCE: Patient, notes from PCP  _________________________________   HISTORICAL  CHIEF COMPLAINT:  Chief Complaint  Patient presents with  . Numbness  . Peripheral Neuropathy    HISTORY OF PRESENT ILLNESS:  [name] is a 79 year old man with progressive numbness and gait disturbance over the last year..  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  He saw hematology and they will continue to follow him.  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   He continues to have occasional falls.  We discussed safety today.   He was diagnosed with diabetes presenting with very high glucose a couple months ago.  He also has numbness in the hands.  The numbness in the hands is severe in the fingertips but only mild in the palms.  REVIEW OF SYSTEMS: Constitutional: No fevers, chills, sweats, or change in appetite.  He has some weight loss. Eyes: No visual changes, double vision, eye pain Ear, nose and throat: No hearing loss, ear pain, nasal congestion, sore throat Cardiovascular: No chest pain, palpitations Respiratory: No shortness of breath at rest or with exertion.   No wheezes GastrointestinaI: No nausea, vomiting, diarrhea, abdominal pain, fecal incontinence Genitourinary: No dysuria, urinary retention or frequency.  No nocturia. Musculoskeletal: No neck pain, back pain Integumentary: No rash, pruritus, skin lesions Neurological: as above Psychiatric: No depression at this time.  No anxiety Endocrine: No palpitations, diaphoresis, change in appetite, change in  weigh or increased thirst Hematologic/Lymphatic: No anemia, purpura, petechiae. Allergic/Immunologic: No itchy/runny eyes, nasal congestion, recent allergic reactions, rashes  ALLERGIES: No Known Allergies  HOME MEDICATIONS:  Current Outpatient Medications:  .  atorvastatin (LIPITOR) 40 MG tablet, TAKE 1 TABLET 2 TIMES A WEEK, Disp: 24 tablet, Rfl: 1 .  Blood Glucose Monitoring Suppl (ONETOUCH VERIO REFLECT) w/Device KIT, See admin instructions., Disp: , Rfl:  .  cyanocobalamin 1000 MCG tablet, Take 1,000 mcg by mouth daily., Disp: , Rfl:  .  glipiZIDE (GLUCOTROL) 10 MG tablet, Take 1 tablet (10 mg total) by mouth daily before breakfast., Disp: 30 tablet, Rfl: 5 .  lisinopril (ZESTRIL) 40 MG tablet, TAKE 1 TABLET BY MOUTH EVERY DAY, Disp: 90 tablet, Rfl: 1 .  MAGNESIUM PO, Take 420 mg by mouth., Disp: , Rfl:  .  metFORMIN (GLUCOPHAGE) 500 MG tablet, Take 1 tablet (500 mg total) by mouth 2 (two) times daily with a meal., Disp: 180 tablet, Rfl: 3 .  metoprolol succinate (TOPROL-XL) 50 MG 24 hr tablet, Take 1 tablet (50 mg total) by mouth daily. Take with or immediately following a meal., Disp: 90 tablet, Rfl: 3 .  Multiple Vitamins-Minerals (CENTRUM SILVER PO), Take 1 capsule by mouth daily.  , Disp: , Rfl:  .  nitroGLYCERIN (NITROSTAT) 0.4 MG SL tablet, DISSOLVE 1 TABLET UNDER TONGUE AS NEEDED FOR CHEST PAIN (Patient not taking: Reported on 06/30/2019), Disp: 25 tablet, Rfl: prn .  OneTouch Delica Lancets 03K MISC, TEST 3 TIME DAILY, Disp: 100 each, Rfl: 1 .  ONETOUCH VERIO test strip, 100 each by Other route 3 (three) times daily. for testing R73.9, Disp: 100 each, Rfl: 1 .  rivaroxaban (XARELTO)  20 MG TABS tablet, Take 1 tablet (20 mg total) by mouth daily with supper., Disp: 30 tablet, Rfl: 11 .  tamsulosin (FLOMAX) 0.4 MG CAPS capsule, TAKE 1 CAPSULE BY MOUTH EVERYDAY AT BEDTIME, Disp: 30 capsule, Rfl: 2  PAST MEDICAL HISTORY: Past Medical History:  Diagnosis Date  . Coronary  atherosclerosis of unspecified type of vessel, native or graft   . Diabetes ([place])   . Diverticulosis   . Hepatomegaly   . Hx of colonic polyps   . Obesity, unspecified   . Obstructive sleep apnea    Uses CPAP.   Marland Kitchen Other and unspecified hyperlipidemia   . Unspecified essential hypertension     PAST SURGICAL HISTORY: Past Surgical History:  Procedure Laterality Date  . COLONOSCOPY  9/17/20012  . CORONARY STENT PLACEMENT  08/2006   LAD  . POLYPECTOMY  09/17/20012  . TONSILLECTOMY      FAMILY HISTORY: Family History  Problem Relation Age of Onset  . Heart disease Father        and Multiple family members on father Side   . Heart attack Father   . Cancer Mother   . Hypertension Mother   . Hypertension Sister   . Healthy Son   . Irritable bowel syndrome Maternal Grandmother   . Colon cancer Neg Hx     SOCIAL HISTORY:  Social History   Socioeconomic History  . Marital status: Married    Spouse name: Not on file  . Number of children: 1  . Years of education: 12 years  . Highest education level: Some college, no degree  Occupational History  . Occupation: Nurse, learning disability    Comment: Retired   . 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 Smoking status: Former Smoker    Packs/day: 1.00    Types: Cigarettes    Quit date: 03/06/1971    Years since quitting: 48.4  . Smokeless tobacco: Former Systems developer    Types: [place] date: 07/30/1989  Substance and Sexual Activity  . Alcohol use: Yes    Alcohol/week: 2.0 standard drinks    Types: 2 Cans of beer per week    Comment: 2 beers/day  . Drug use: No  . Sexual activity: Never  Other Topics Concern  . 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 He is married and lives at home with his wife. He has one adult son. Their teenage granddaughter has recently moved in with them.    Right handed    Caffeine~ does not use     Social Determinants of Health   Financial Resource Strain: Medium Risk  . Difficulty of Paying Living Expenses: Somewhat hard  Food Insecurity:   . Worried About Charity fundraiser in the Last Year: Not on file  . Ran Out of Food in the Last Year: Not on file  Transportation Needs:   . Lack of Transportation (Medical): Not on file  . Lack of Transportation (Non-Medical): Not on file  Physical Activity: Inactive  . Days of Exercise per Week: 0 days  . Minutes of Exercise per Session: 0 min  Stress: No Stress Concern Present  . Feeling of Stress : Not at all  Social Connections: Unknown  . Frequency of Communication with Friends and Family: Not on file  . Frequency of Social Gatherings with Friends and Family: Three times a  week  . Attends Religious Services: Not on file  . Active Member of Clubs or Organizations: Not on file  . Attends Archivist Meetings: Not on file  . Marital Status: Not on file  Intimate Partner Violence:   . Fear of Current or Ex-Partner: Not on file  . Emotionally Abused: Not on file  . Physically Abused: Not on file  . Sexually Abused: Not on file     PHYSICAL EXAM  There were no vitals filed for this visit.  There is no height or weight on file to calculate BMI.   General: The patient is well-developed and well-nourished and in no acute distress  HEENT:  Head is [place]/AT.  Sclera are anicteric.    Neck: No carotid bruits are noted.  The neck is nontender.  Cardiovascular: The heart has a regular rate and rhythm with a normal S1 and S2. There were no murmurs, gallops or rubs.    Skin: Extremities are without rash or  edema.  Musculoskeletal:  Back is nontender  Neurologic Exam  Mental status: The patient is alert and oriented x 3 at the time of the examination.  Short-term memory and focus appeared fairly normal today.  Cranial nerves: Extraocular movements are full. Pupils are equal, round, and reactive to light and  accomodation.  Visual fields are full.  Facial symmetry is present. There is good facial sensation to soft touch bilaterally.Facial strength is normal.  Trapezius and sternocleidomastoid strength is normal. No dysarthria is noted.   He has bilateral hearing deficits. Motor:  Muscle bulk is normal.   Tone is normal. Strength is  5 / 5 in all 4 extremities.   Sensory: He has reduced vibration sensation in the fingers relative to the hand and very reduced sensation to pinprick in the fingertips and distal fingers relative to the hand.  He has normal sensation to vibration touch and temperature at the knees.  He has absent sensation to touch in the feet up to the ankles (10%) bilaterally and no sensation to vibration at the toes and 20% sensation at the ankles relative to the knees.  Coordination: Cerebellar testing reveals good finger-nose-finger and heel-to-shin bilaterally.  Gait and station: Station is normal.  Gait is very wide without a cane..  With a cane he does better.  He is unable to do a tandem gait.  Romberg is positive.    Reflexes: Deep tendon reflexes are symmetric and present at the arms and knees but absent at the ankles..   Plantar responses are flexor.    DIAGNOSTIC DATA (LABS, IMAGING, TESTING) - I reviewed patient records, labs, notes, testing and imaging myself where available.  Lab Results  Component Value Date   WBC 5.5 06/17/2019   HGB 12.5 (L) 06/17/2019   HCT 35.5 (L) 06/17/2019   MCV 100 (H) 06/17/2019   PLT 95 (LL) 06/17/2019      Component Value Date/Time   NA 144 06/17/2019 1140   K 4.6 06/17/2019 1140   CL 107 (H) 06/17/2019 1140   CO2 25 06/17/2019 1140   GLUCOSE 89 06/17/2019 1140   GLUCOSE 106 (H) 09/09/2014 1451   BUN 11 06/17/2019 1140   CREATININE 0.85 06/17/2019 1140   CREATININE 0.94 11/06/2012 0945   CALCIUM 9.9 06/17/2019 1140   PROT 6.4 06/17/2019 1140   ALBUMIN 3.9 06/17/2019 1140   AST 28 06/17/2019 1140   ALT 18 06/17/2019 1140    ALKPHOS 119 (H) 06/17/2019 1140   BILITOT 0.8 06/17/2019  [place] 06/17/2019 1140   GFRAA 97 06/17/2019 1140   Lab Results  Component Value Date   CHOL 138 08/04/2018   HDL 63 08/04/2018   LDLCALC 62 08/04/2018   TRIG 63 08/04/2018   CHOLHDL 2.2 08/04/2018   Lab Results  Component Value Date   HGBA1C >14.0 (H) 05/11/2019   Lab Results  Component Value Date   JUVQQUIV14 643 06/17/2019   Lab Results  Component Value Date   TSH 2.899 06/18/2010       ASSESSMENT AND PLAN  Polyneuropathy - Plan: NCV with EMG(electromyography)  Numbness - Plan: NCV with EMG(electromyography)   1.   We discussed safety and using a cane or walker.  We also discussed getting safety bars for the shower or a shower chair.  He already uses a night light at night to help when he goes to the bathroom.  We discussed making sure that the bedroom was kept clutter free. 2.   He will follow up with hematology as scheduled.  His M spike is small.  He has an IgG lambda. 3.   Increase the pattern on the EMG/NCV is axonal more than demyelinating, he does not appear to have a superimposed CIDP.  However, if the neuropathy becomes much worse, consider an IVIG trial. 4.    He will return to see me in 4 months or sooner if there are new or worsening neurologic symptoms.  Kella Splinter A. [name], MD, [facility] 1/42/7670, 1:10 PM Certified in Neurology, Clinical Neurophysiology, Sleep Medicine and Neuroimaging  [facility] [address] [place], [place] [address] [phone]

## 2019-08-13 DIAGNOSIS — R69 Illness, unspecified: Secondary | ICD-10-CM | POA: Diagnosis not present

## 2019-08-21 DIAGNOSIS — R69 Illness, unspecified: Secondary | ICD-10-CM | POA: Diagnosis not present

## 2019-08-31 DIAGNOSIS — R69 Illness, unspecified: Secondary | ICD-10-CM | POA: Diagnosis not present

## 2019-09-04 DIAGNOSIS — R69 Illness, unspecified: Secondary | ICD-10-CM | POA: Diagnosis not present

## 2019-09-05 ENCOUNTER — Other Ambulatory Visit: Payer: Self-pay | Admitting: Physician Assistant

## 2019-09-07 DIAGNOSIS — R69 Illness, unspecified: Secondary | ICD-10-CM | POA: Diagnosis not present

## 2019-09-23 ENCOUNTER — Telehealth: Payer: Self-pay | Admitting: *Deleted

## 2019-09-23 DIAGNOSIS — R69 Illness, unspecified: Secondary | ICD-10-CM | POA: Diagnosis not present

## 2019-09-23 NOTE — Telephone Encounter (Signed)
   [facility] Pre-operative Risk Assessment    Request for surgical clearance:  1. What type of surgery is being performed? EGD   2. When is this surgery scheduled? 10/15/19   3. What type of clearance is required (medical clearance vs. Pharmacy clearance to hold med vs. Both)? pharmacy  4. Are there any medications that need to be held prior to surgery and how long? Xarelto x 2 days   5. Practice name and name of physician performing surgery? [facility] Dr. Allyn Kenner   6. What is your office phone number [phone]    7.   What is your office fax number [phone]  8.   Anesthesia type (None, local, MAC, general) ?    Jamerion Cabello A Draeden Kellman 09/23/2019, 2:02 PM  _________________________________________________________________

## 2019-09-23 NOTE — Telephone Encounter (Signed)
Pharm please address [email or web address] thanks 

## 2019-09-23 NOTE — Telephone Encounter (Signed)
Pt takes Xarelto for afib with CHADS2VASc score of 5 (age x2, HTN, DM, CAD). Renal function is normal. Ok to hold Xarelto for 2 days prior as requested.

## 2019-10-04 ENCOUNTER — Other Ambulatory Visit: Payer: Self-pay | Admitting: Cardiology

## 2019-10-06 DIAGNOSIS — R69 Illness, unspecified: Secondary | ICD-10-CM | POA: Diagnosis not present

## 2019-10-07 DIAGNOSIS — R69 Illness, unspecified: Secondary | ICD-10-CM | POA: Diagnosis not present

## 2019-10-22 ENCOUNTER — Other Ambulatory Visit: Payer: Self-pay | Admitting: Physician Assistant

## 2019-10-22 DIAGNOSIS — E119 Type 2 diabetes mellitus without complications: Secondary | ICD-10-CM

## 2019-10-23 DIAGNOSIS — G4733 Obstructive sleep apnea (adult) (pediatric): Secondary | ICD-10-CM | POA: Diagnosis not present

## 2019-11-05 ENCOUNTER — Other Ambulatory Visit: Payer: Self-pay | Admitting: *Deleted

## 2019-11-05 MED ORDER — ATORVASTATIN CALCIUM 40 MG PO TABS: ORAL_TABLET | ORAL | 0 refills | Status: DC

## 2019-11-09 ENCOUNTER — Encounter: Payer: Self-pay | Admitting: Neurology

## 2019-11-09 ENCOUNTER — Ambulatory Visit: Payer: Medicare HMO | Admitting: Neurology

## 2019-11-09 ENCOUNTER — Other Ambulatory Visit: Payer: Self-pay

## 2019-11-09 VITALS — BP 125/53 | HR 55 | Temp 98.5°F | Ht 69.0 in | Wt 217.5 lb

## 2019-11-09 DIAGNOSIS — R200 Anesthesia of skin: Secondary | ICD-10-CM | POA: Diagnosis not present

## 2019-11-09 DIAGNOSIS — D472 Monoclonal gammopathy: Secondary | ICD-10-CM | POA: Diagnosis not present

## 2019-11-09 DIAGNOSIS — R269 Unspecified abnormalities of gait and mobility: Secondary | ICD-10-CM | POA: Diagnosis not present

## 2019-11-09 DIAGNOSIS — G629 Polyneuropathy, unspecified: Secondary | ICD-10-CM

## 2019-11-09 NOTE — Progress Notes (Signed)
[facility]  PATIENT: [name] DOB: 02-07-1941  REFERRING DOCTOR OR PCP: Donaciano Eva, PA-C SOURCE: Patient, notes from PCP  _________________________________   HISTORICAL  CHIEF COMPLAINT:  Chief Complaint  Patient presents with  . Follow-up    RM 12, alone. Last seen 08/11/2019. Here to f/u on neuropathy. Uses cane.  In process of getting a safety bar/shower chair. His diastolic BP was low, he denies feeling light-headed or dizzy.     HISTORY OF PRESENT ILLNESS:  [name] is a 79 year old man with progressive numbness and gait disturbance over the last year.Marland Kitchen  Update 11/09/2019: He has painless numbness in his feet (very numb), ankles/mid lower leg and hands.   He is unsteady with eyes closed.    He has mild weakness in feet and hands.  He is off balanced and uses a cane.   He is in the process to get a shower chair.    Laboratory test on 06/30/2019 showed an M spike of 0.5 g/dL and immunofixation showed IgG lambda monoclonal protein.  UPEP was negative.  Skeletal survey was negative.  He saw Dr. Delton Coombes and will be following up with him.   He will also be doing a BM biopsy.   He had a spot   Impression: This NCV/EMG study shows the following: 1.   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 2.   Possible mild superimposed median neuropathy at the wrist. 3.   No significant radiculopathy noted.   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  He saw hematology and they will continue to follow him.  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   He continues to have occasional falls.  We  discussed safety today.   He was diagnosed with diabetes presenting with very high glucose a couple months ago.  He also has numbness in the hands.  The numbness in the hands is severe in the fingertips but only mild in the palms.  REVIEW OF SYSTEMS: Constitutional: No fevers, chills, sweats, or change in appetite.  He has some weight loss. Eyes: No visual changes, double vision, eye pain Ear, nose and throat: No hearing loss, ear pain, nasal congestion, sore throat Cardiovascular: No chest pain, palpitations Respiratory: No shortness of breath at rest or with exertion.   No wheezes GastrointestinaI: No nausea, vomiting, diarrhea, abdominal pain, fecal incontinence Genitourinary: No dysuria, urinary retention or frequency.  No nocturia. Musculoskeletal: No neck pain, back pain Integumentary: No rash, pruritus, skin lesions Neurological: as above Psychiatric: No depression at this time.  No anxiety Endocrine: No palpitations, diaphoresis, change in appetite, change in weigh or increased thirst Hematologic/Lymphatic: No anemia, purpura, petechiae. Allergic/Immunologic: No itchy/runny eyes, nasal congestion, recent allergic reactions, rashes  ALLERGIES: No Known Allergies  HOME MEDICATIONS:  Current Outpatient Medications:  .  atorvastatin (LIPITOR) 40 MG tablet, TAKE 1 TABLET 2 TIMES A WEEK (Needs to be seen before next refill), Disp: 8 tablet, Rfl: 0 .  Blood Glucose Monitoring Suppl (ONETOUCH VERIO REFLECT) w/Device KIT, See admin instructions., Disp: , Rfl:  .  cyanocobalamin 1000 MCG tablet, Take 1,000 mcg by mouth daily., Disp: , Rfl:  .  glipiZIDE (GLUCOTROL)  10 MG tablet, Take 1 tablet (10 mg total) by mouth daily before breakfast. (Needs to be seen before next refill), Disp: 30 tablet, Rfl: 0 .  lisinopril (ZESTRIL) 40 MG tablet, TAKE 1 TABLET BY MOUTH EVERY DAY, Disp: 90 tablet, Rfl: 1 .  MAGNESIUM PO, Take 420 mg by mouth., Disp: , Rfl:  .  metFORMIN (GLUCOPHAGE) 500 MG  tablet, Take 1 tablet (500 mg total) by mouth 2 (two) times daily with a meal., Disp: 180 tablet, Rfl: 3 .  metoprolol succinate (TOPROL-XL) 50 MG 24 hr tablet, Take 1 tablet (50 mg total) by mouth daily. Take with or immediately following a meal., Disp: 90 tablet, Rfl: 3 .  Multiple Vitamins-Minerals (CENTRUM SILVER PO), Take 1 capsule by mouth daily.  , Disp: , Rfl:  .  nitroGLYCERIN (NITROSTAT) 0.4 MG SL tablet, DISSOLVE 1 TABLET UNDER TONGUE AS NEEDED FOR CHEST PAIN, Disp: 25 tablet, Rfl: prn .  OneTouch Delica Lancets 39Q MISC, TEST 3 TIME DAILY, Disp: 100 each, Rfl: 1 .  ONETOUCH VERIO test strip, 100 EACH BY OTHER ROUTE 3 (THREE) TIMES DAILY. FOR TESTING R73.9, Disp: 100 strip, Rfl: 1 .  tamsulosin (FLOMAX) 0.4 MG CAPS capsule, TAKE 1 CAPSULE BY MOUTH EVERYDAY AT BEDTIME, Disp: 30 capsule, Rfl: 2 .  XARELTO 20 MG TABS tablet, TAKE 1 TABLET (20 MG TOTAL) BY MOUTH DAILY WITH SUPPER., Disp: 90 tablet, Rfl: 1  PAST MEDICAL HISTORY: Past Medical History:  Diagnosis Date  . 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 Diabetes ([place])   . Diverticulosis   . Hepatomegaly   . Hx of colonic polyps   . Obesity, unspecified   . Obstructive sleep apnea    Uses CPAP.   Marland Kitchen Other and unspecified hyperlipidemia   . Unspecified essential hypertension     PAST SURGICAL HISTORY: Past Surgical History:  Procedure Laterality Date  . COLONOSCOPY  9/17/20012  . CORONARY STENT PLACEMENT  08/2006   LAD  . POLYPECTOMY  09/17/20012  . TONSILLECTOMY      FAMILY HISTORY: Family History  Problem Relation Age of Onset  . Heart disease Father        and Multiple family members on father Side   . Heart attack Father   . Cancer Mother   . Hypertension Mother   . Hypertension Sister   . Healthy Son   . Irritable bowel syndrome Maternal Grandmother   . Colon cancer Neg Hx     SOCIAL HISTORY:  Social History   Socioeconomic History  . Marital status: Married     Spouse name: Not on file  . Number of children: 1  . Years of education: 12 years  . Highest education level: Some college, no degree  Occupational History  . Occupation: Nurse, learning disability    Comment: Retired   . 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 Smoking status: Former Smoker    Packs/day: 1.00    Types: Cigarettes    Quit date: 03/06/1971    Years since quitting: 48.7  . Smokeless tobacco: Former Systems developer    Types: [place] date: 07/30/1989  Substance and Sexual Activity  . Alcohol use: Yes    Alcohol/week: 2.0 standard drinks    Types: 2 Cans of beer per week    Comment: 2 beers/day  . Drug use: No  . Sexual activity: Never  Other Topics Concern  .  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 He is married and lives at home with his wife. He has one adult son. Their teenage granddaughter has recently moved in with them.    Right handed    Caffeine~ does not use    Social Determinants of Health   Financial Resource Strain: Medium Risk  . Difficulty of Paying Living Expenses: Somewhat hard  Food Insecurity:   . Worried About Charity fundraiser in the Last Year:   . Arboriculturist in the Last Year:   Transportation Needs:   . Film/video editor (Medical):   Marland Kitchen Lack of Transportation (Non-Medical):   Physical Activity: Inactive  . Days of Exercise per Week: 0 days  . Minutes of Exercise per Session: 0 min  Stress: No Stress Concern Present  . Feeling of Stress : Not at all  Social Connections: Unknown  . Frequency of Communication with Friends and Family: Not on file  . Frequency of Social Gatherings with Friends and Family: Three times a week  . Attends Religious Services: Not on file  . Active Member of Clubs or Organizations: Not on file  . Attends Archivist Meetings: Not on file  . Marital Status: Not on file  Intimate Partner Violence:   .  Fear of Current or Ex-Partner:   . Emotionally Abused:   Marland Kitchen Physically Abused:   . Sexually Abused:      PHYSICAL EXAM  Vitals:   11/09/19 0951  BP: (!) 125/53  Pulse: (!) 55  Temp: 98.5 F (36.9 C)  Weight: 217 lb 8 oz (98.7 kg)  Height: '5\' 9"'$  (1.753 m)    Body mass index is 32.12 kg/m.   General: The patient is well-developed and well-nourished and in no acute distress  HEENT:  Head is [place]/AT.  Sclera are anicteric.     Skin: Extremities are without rash or  edema.   Neurologic Exam  Mental status: The patient is alert and oriented x 3 at the time of the examination.  Short-term memory and focus appeared fairly normal today.  Cranial nerves: Extraocular movements are full.  .Facial strength is normal.  Trapezius and sternocleidomastoid strength is normal. No dysarthria is noted.   He has bilateral hearing deficits. Motor:  Muscle bulk is normal.   Tone is normal. Strength is  5 / 5 in all 4 extremities.   Sensory: He has reduced vibration sensation in the fingers relative to the hand and very reduced sensation to pinprick in the fingertips and distal fingers relative to the hand.  He has normal sensation to vibration touch and temperature at the knees.  He has absent sensation to touch in the feet up to the ankles (10%) bilaterally and no sensation to vibration at the toes and 25% sensation at the ankles relative to the knees.   This is +/- unchanged  Coordination: Cerebellar testing reveals good finger-nose-finger and heel-to-shin bilaterally.  Gait and station: Station is normal.  Gait is very wide without a cane.Marland Kitchen He can't do a tandem gait.   Romberg is positive.    Reflexes: Deep tendon reflexes are symmetric and present at the arms and knees but absent at the ankles.Marland Kitchen        DIAGNOSTIC DATA (LABS, IMAGING, TESTING) - I reviewed patient records, labs, notes, testing and imaging myself where available.  Lab Results  Component Value Date   WBC  5.5 06/17/2019    HGB 12.5 (L) 06/17/2019   HCT 35.5 (L) 06/17/2019   MCV 100 (H) 06/17/2019   PLT 95 (LL) 06/17/2019      Component Value Date/Time   NA 144 06/17/2019 1140   K 4.6 06/17/2019 1140   CL 107 (H) 06/17/2019 1140   CO2 25 06/17/2019 1140   GLUCOSE 89 06/17/2019 1140   GLUCOSE 106 (H) 09/09/2014 1451   BUN 11 06/17/2019 1140   CREATININE 0.85 06/17/2019 1140   CREATININE 0.94 11/06/2012 0945   CALCIUM 9.9 06/17/2019 1140   PROT 6.4 06/17/2019 1140   ALBUMIN 3.9 06/17/2019 1140   AST 28 06/17/2019 1140   ALT 18 06/17/2019 1140   ALKPHOS 119 (H) 06/17/2019 1140   BILITOT 0.8 06/17/2019 1140   GFRNONAA 84 06/17/2019 1140   GFRAA 97 06/17/2019 1140   Lab Results  Component Value Date   CHOL 138 08/04/2018   HDL 63 08/04/2018   LDLCALC 62 08/04/2018   TRIG 63 08/04/2018   CHOLHDL 2.2 08/04/2018   Lab Results  Component Value Date   HGBA1C >14.0 (H) 05/11/2019   Lab Results  Component Value Date   SMOLMBEM75 449 06/17/2019   Lab Results  Component Value Date   TSH 2.899 06/18/2010       ASSESSMENT AND PLAN  Gait disturbance  Polyneuropathy  MGUS (monoclonal gammopathy of unknown significance)  Numbness  1.   We discussed safety and using a cane.   Advised grab bars or shower chair..  Use a night light at night to help when he goes to the bathroom and make sure that the bedroom is kept clutter free. 2.   He will follow up with hematology as scheduled.  His M spike is small.  He has an IgG lambda.    He is going to get bloodwork ordered by hematology soon 3.  The pattern on the EMG/NCV is axonal more than demyelinating, so he does not appear to have a superimposed CIDP.   4.    He will return to see me in 4 months or sooner if there are new or worsening neurologic symptoms.  Salik Grewell A. [name], MD, [facility] 08/30/69, 21:97 AM Certified in Neurology, Clinical Neurophysiology, Sleep Medicine and Neuroimaging  [facility] [address], [place], [place] [address] [phone]

## 2019-11-12 ENCOUNTER — Inpatient Hospital Stay (HOSPITAL_COMMUNITY): Payer: Medicare HMO | Attending: Hematology

## 2019-11-12 ENCOUNTER — Other Ambulatory Visit: Payer: Self-pay

## 2019-11-12 DIAGNOSIS — Z809 Family history of malignant neoplasm, unspecified: Secondary | ICD-10-CM | POA: Insufficient documentation

## 2019-11-12 DIAGNOSIS — Z8249 Family history of ischemic heart disease and other diseases of the circulatory system: Secondary | ICD-10-CM | POA: Diagnosis not present

## 2019-11-12 DIAGNOSIS — Z8719 Personal history of other diseases of the digestive system: Secondary | ICD-10-CM | POA: Insufficient documentation

## 2019-11-12 DIAGNOSIS — D472 Monoclonal gammopathy: Secondary | ICD-10-CM | POA: Insufficient documentation

## 2019-11-12 DIAGNOSIS — Z79899 Other long term (current) drug therapy: Secondary | ICD-10-CM | POA: Diagnosis not present

## 2019-11-12 DIAGNOSIS — Z7901 Long term (current) use of anticoagulants: Secondary | ICD-10-CM | POA: Diagnosis not present

## 2019-11-12 DIAGNOSIS — Z87891 Personal history of nicotine dependence: Secondary | ICD-10-CM | POA: Diagnosis not present

## 2019-11-12 DIAGNOSIS — Z8379 Family history of other diseases of the digestive system: Secondary | ICD-10-CM | POA: Insufficient documentation

## 2019-11-12 DIAGNOSIS — I10 Essential (primary) hypertension: Secondary | ICD-10-CM | POA: Insufficient documentation

## 2019-11-12 DIAGNOSIS — D696 Thrombocytopenia, unspecified: Secondary | ICD-10-CM | POA: Insufficient documentation

## 2019-11-12 LAB — COMPREHENSIVE METABOLIC PANEL
ALT: 22 U/L (ref 0–44)
AST: 25 U/L (ref 15–41)
Albumin: 3.8 g/dL (ref 3.5–5.0)
Alkaline Phosphatase: 71 U/L (ref 38–126)
Anion gap: 8 (ref 5–15)
BUN: 17 mg/dL (ref 8–23)
CO2: 29 mmol/L (ref 22–32)
Calcium: 9.7 mg/dL (ref 8.9–10.3)
Chloride: 103 mmol/L (ref 98–111)
Creatinine, Ser: 0.88 mg/dL (ref 0.61–1.24)
GFR calc Af Amer: >60 mL/min (ref >60)
GFR calc non Af Amer: >60 mL/min (ref >60)
Glucose, Bld: 101 mg/dL — ABNORMAL HIGH (ref 70–99)
Potassium: 4.6 mmol/L (ref 3.5–5.1)
Sodium: 140 mmol/L (ref 135–145)
Total Bilirubin: 1.1 mg/dL (ref 0.3–1.2)
Total Protein: 6.9 g/dL (ref 6.5–8.1)

## 2019-11-12 LAB — CBC WITH DIFFERENTIAL/PLATELET
Abs Immature Granulocytes: 0.01 10*3/uL (ref 0.00–0.07)
Basophils Absolute: 0 10*3/uL (ref 0.0–0.1)
Basophils Relative: 1 %
Eosinophils Absolute: 0.1 10*3/uL (ref 0.0–0.5)
Eosinophils Relative: 2 %
HCT: 37.8 % — ABNORMAL LOW (ref 39.0–52.0)
Hemoglobin: 12.7 g/dL — ABNORMAL LOW (ref 13.0–17.0)
Immature Granulocytes: 0 %
Lymphocytes Relative: 23 %
Lymphs Abs: 1.2 10*3/uL (ref 0.7–4.0)
MCH: 32.7 pg (ref 26.0–34.0)
MCHC: 33.6 g/dL (ref 30.0–36.0)
MCV: 97.4 fL (ref 80.0–100.0)
Monocytes Absolute: 0.4 10*3/uL (ref 0.1–1.0)
Monocytes Relative: 8 %
Neutro Abs: 3.4 10*3/uL (ref 1.7–7.7)
Neutrophils Relative %: 66 %
Platelets: 78 10*3/uL — ABNORMAL LOW (ref 150–400)
RBC: 3.88 MIL/uL — ABNORMAL LOW (ref 4.22–5.81)
RDW: 13.2 % (ref 11.5–15.5)
WBC: 5.1 10*3/uL (ref 4.0–10.5)
nRBC: 0 % (ref 0.0–0.2)

## 2019-11-12 LAB — LACTATE DEHYDROGENASE: LDH: 76 U/L — ABNORMAL LOW (ref 98–192)

## 2019-11-13 LAB — PROTEIN ELECTROPHORESIS, SERUM
A/G Ratio: 1.3 (ref 0.7–1.7)
Albumin ELP: 3.6 g/dL (ref 2.9–4.4)
Alpha-1-Globulin: 0.2 g/dL (ref 0.0–0.4)
Alpha-2-Globulin: 0.5 g/dL (ref 0.4–1.0)
Beta Globulin: 1 g/dL (ref 0.7–1.3)
Gamma Globulin: 1 g/dL (ref 0.4–1.8)
Globulin, Total: 2.8 g/dL (ref 2.2–3.9)
M-Spike, %: 0.4 g/dL — ABNORMAL HIGH
Total Protein ELP: 6.4 g/dL (ref 6.0–8.5)

## 2019-11-13 LAB — KAPPA/LAMBDA LIGHT CHAINS
Kappa free light chain: 49 mg/L — ABNORMAL HIGH (ref 3.3–19.4)
Kappa, lambda light chain ratio: 1.13 (ref 0.26–1.65)
Lambda free light chains: 43.4 mg/L — ABNORMAL HIGH (ref 5.7–26.3)

## 2019-11-16 ENCOUNTER — Other Ambulatory Visit: Payer: Self-pay | Admitting: *Deleted

## 2019-11-16 DIAGNOSIS — E119 Type 2 diabetes mellitus without complications: Secondary | ICD-10-CM

## 2019-11-16 NOTE — Telephone Encounter (Signed)
Former Ronnald Ramp. NTBS 30 days given 10/22/19

## 2019-11-19 ENCOUNTER — Inpatient Hospital Stay (HOSPITAL_COMMUNITY): Payer: Medicare HMO | Admitting: Nurse Practitioner

## 2019-11-19 ENCOUNTER — Other Ambulatory Visit: Payer: Self-pay

## 2019-11-19 DIAGNOSIS — D472 Monoclonal gammopathy: Secondary | ICD-10-CM

## 2019-11-19 DIAGNOSIS — Z8719 Personal history of other diseases of the digestive system: Secondary | ICD-10-CM | POA: Diagnosis not present

## 2019-11-19 DIAGNOSIS — Z809 Family history of malignant neoplasm, unspecified: Secondary | ICD-10-CM | POA: Diagnosis not present

## 2019-11-19 DIAGNOSIS — Z8379 Family history of other diseases of the digestive system: Secondary | ICD-10-CM | POA: Diagnosis not present

## 2019-11-19 DIAGNOSIS — Z7901 Long term (current) use of anticoagulants: Secondary | ICD-10-CM | POA: Diagnosis not present

## 2019-11-19 DIAGNOSIS — I10 Essential (primary) hypertension: Secondary | ICD-10-CM | POA: Diagnosis not present

## 2019-11-19 DIAGNOSIS — Z79899 Other long term (current) drug therapy: Secondary | ICD-10-CM | POA: Diagnosis not present

## 2019-11-19 DIAGNOSIS — D696 Thrombocytopenia, unspecified: Secondary | ICD-10-CM | POA: Diagnosis not present

## 2019-11-19 DIAGNOSIS — Z87891 Personal history of nicotine dependence: Secondary | ICD-10-CM | POA: Diagnosis not present

## 2019-11-19 DIAGNOSIS — Z8249 Family history of ischemic heart disease and other diseases of the circulatory system: Secondary | ICD-10-CM | POA: Diagnosis not present

## 2019-11-19 NOTE — Progress Notes (Signed)
[name], [name] [id number]   CLINIC:  Medical Oncology/Hematology  PCP:  Theodoro Clock [address] [place] [address] [phone]   REASON FOR VISIT: Follow-up for MGUS   CURRENT THERAPY: Observation   INTERVAL HISTORY:  [name] 79 y.o. male returns for routine follow-up for MGUS.  Patient reports he is doing well since his last visit.  He denies any new pains.Denies any nausea, vomiting, or diarrhea. Denies any new pains. Had not noticed any recent bleeding such as epistaxis, hematuria or hematochezia. Denies recent chest pain on exertion, shortness of breath on minimal exertion, pre-syncopal episodes, or palpitations. Denies any numbness or tingling in hands or feet. Denies any recent fevers, infections, or recent hospitalizations.  He does report he is seeing an oncologist hematologist at the [facility] now and does not need both.  He will continue his care at the [facility] for now and follow-up with [place] if he needs [place].    REVIEW OF SYSTEMS:  Review of Systems  All other systems reviewed and are negative.    PAST MEDICAL/SURGICAL HISTORY:  Past Medical History:  Diagnosis Date  . 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 Diabetes ([place])   . Diverticulosis   . Hepatomegaly   . Hx of colonic polyps   . Obesity, unspecified   . Obstructive sleep apnea    Uses CPAP.   Marland Kitchen Other and unspecified hyperlipidemia   . Unspecified essential hypertension    Past Surgical History:  Procedure Laterality Date  . COLONOSCOPY  9/17/20012  . CORONARY STENT PLACEMENT  08/2006   LAD  . POLYPECTOMY  09/17/20012  . TONSILLECTOMY       SOCIAL HISTORY:  Social History   Socioeconomic History  . Marital status: Married    Spouse name: Not on file  . Number of children: 1  . Years of education: 12 years  . Highest education level: Some college, no degree  Occupational History  . Occupation:  Nurse, learning disability    Comment: Retired   . 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 Smoking status: Former Smoker    Packs/day: 1.00    Types: Cigarettes    Quit date: 03/06/1971    Years since quitting: 48.7  . Smokeless tobacco: Former Systems developer    Types: Marissa date: 07/30/1989  Substance and Sexual Activity  . Alcohol use: Yes    Alcohol/week: 2.0 standard drinks    Types: 2 Cans of beer per week    Comment: 2 beers/day  . Drug use: No  . Sexual activity: Never  Other Topics Concern  . 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 He is married and lives at home with his wife. He has one adult son. Their teenage granddaughter has recently moved in with them.    Right handed    Caffeine~ does not use    Social Determinants of Health   Financial Resource Strain: Medium Risk  . Difficulty of Paying Living Expenses: Somewhat hard  Food Insecurity:   . Worried About Charity fundraiser in the Last Year:   . Arboriculturist in the Last Year:   Transportation Needs:   . Film/video editor (Medical):   Marland Kitchen Lack of Transportation (Non-Medical):  Physical Activity: Inactive  . Days of Exercise per Week: 0 days  . Minutes of Exercise per Session: 0 min  Stress: No Stress Concern Present  . Feeling of Stress : Not at all  Social Connections: Unknown  . Frequency of Communication with Friends and Family: Not on file  . Frequency of Social Gatherings with Friends and Family: Three times a week  . Attends Religious Services: Not on file  . Active Member of Clubs or Organizations: Not on file  . Attends Archivist Meetings: Not on file  . Marital Status: Not on file  Intimate Partner Violence:   . Fear of Current or Ex-Partner:   . Emotionally Abused:   Marland Kitchen Physically Abused:   . Sexually Abused:     FAMILY HISTORY:  Family History  Problem Relation Age of Onset  .  Heart disease Father        and Multiple family members on father Side   . Heart attack Father   . Cancer Mother   . Hypertension Mother   . Hypertension Sister   . Healthy Son   . Irritable bowel syndrome Maternal Grandmother   . Colon cancer Neg Hx     CURRENT MEDICATIONS:  Outpatient Encounter Medications as of 11/19/2019  Medication Sig  . atorvastatin (LIPITOR) 40 MG tablet TAKE 1 TABLET 2 TIMES A WEEK (Needs to be seen before next refill) (Patient taking differently: Take 40 mg by mouth daily. TAKE 1 TABLET 3 TIMES A WEEK (Needs to be seen before next refill))  . Blood Glucose Monitoring Suppl (ONETOUCH VERIO REFLECT) w/Device KIT See admin instructions.  . Blood Pressure Monitoring (CVS SERIES 100 BLOOD PRESSURE) DEVI USE AS DIRECTED TO CHECK BLOOD PRESSURE  . cyanocobalamin 1000 MCG tablet Take 1,000 mcg by mouth daily.  Marland Kitchen glipiZIDE (GLUCOTROL) 10 MG tablet Take 1 tablet (10 mg total) by mouth daily before breakfast. (Needs to be seen before next refill)  . lisinopril (ZESTRIL) 40 MG tablet TAKE 1 TABLET BY MOUTH EVERY DAY  . MAGNESIUM PO Take 420 mg by mouth.  . metFORMIN (GLUCOPHAGE) 500 MG tablet Take 1 tablet (500 mg total) by mouth 2 (two) times daily with a meal.  . metoprolol succinate (TOPROL-XL) 50 MG 24 hr tablet Take 1 tablet (50 mg total) by mouth daily. Take with or immediately following a meal.  . Multiple Vitamins-Minerals (CENTRUM SILVER PO) Take 1 capsule by mouth daily.    [name] Delica Lancets 74F MISC TEST 3 TIME DAILY  . ONETOUCH VERIO test strip 100 EACH BY OTHER ROUTE 3 (THREE) TIMES DAILY. FOR TESTING R73.9  . tamsulosin (FLOMAX) 0.4 MG CAPS capsule TAKE 1 CAPSULE BY MOUTH EVERYDAY AT BEDTIME  . XARELTO 20 MG TABS tablet TAKE 1 TABLET (20 MG TOTAL) BY MOUTH DAILY WITH SUPPER.  Marland Kitchen HYDROcodone-acetaminophen (NORCO/VICODIN) 5-325 MG tablet Take 1-2 tablets by mouth every 6 (six) hours as needed.   . nitroGLYCERIN (NITROSTAT) 0.4 MG SL tablet DISSOLVE 1  TABLET UNDER TONGUE AS NEEDED FOR CHEST PAIN (Patient not taking: Reported on 11/19/2019)   No facility-administered encounter medications on file as of 11/19/2019.    ALLERGIES:  No Known Allergies   PHYSICAL EXAM:  ECOG Performance status: 1  Vitals:   11/19/19 1302  BP: (!) 135/54  Pulse: (!) 57  Resp: 18  Temp: (!) 97.3 F (36.3 C)  SpO2: 99%   Filed Weights   11/19/19 1302  Weight: 214 lb 12.8 oz (97.4 kg)  Physical Exam Constitutional:      Appearance: Normal appearance. He is normal weight.  Cardiovascular:     Rate and Rhythm: Normal rate and regular rhythm.     Heart sounds: Normal heart sounds.  Pulmonary:     Effort: Pulmonary effort is normal.     Breath sounds: Normal breath sounds.  Abdominal:     General: Bowel sounds are normal.     Palpations: Abdomen is soft.  Musculoskeletal:        General: Normal range of motion.  Skin:    General: Skin is warm.  Neurological:     Mental Status: He is alert and oriented to person, place, and time. Mental status is at baseline.  Psychiatric:        Mood and Affect: Mood normal.        Behavior: Behavior normal.        Thought Content: Thought content normal.        Judgment: Judgment normal.      LABORATORY DATA:  I have reviewed the labs as listed.  CBC    Component Value Date/Time   WBC 5.1 11/12/2019 0916   RBC 3.88 (L) 11/12/2019 0916   HGB 12.7 (L) 11/12/2019 0916   HGB 12.5 (L) 06/17/2019 1140   HCT 37.8 (L) 11/12/2019 0916   HCT 35.5 (L) 06/17/2019 1140   PLT 78 (L) 11/12/2019 0916   PLT 95 (LL) 06/17/2019 1140   MCV 97.4 11/12/2019 0916   MCV 100 (H) 06/17/2019 1140   MCH 32.7 11/12/2019 0916   MCHC 33.6 11/12/2019 0916   RDW 13.2 11/12/2019 0916   RDW 11.6 06/17/2019 1140   LYMPHSABS 1.2 11/12/2019 0916   LYMPHSABS 1.2 06/17/2019 1140   MONOABS 0.4 11/12/2019 0916   EOSABS 0.1 11/12/2019 0916   EOSABS 0.1 06/17/2019 1140   BASOSABS 0.0 11/12/2019 0916   BASOSABS 0.1  06/17/2019 1140   CMP Latest Ref Rng & Units 11/12/2019 06/17/2019 08/04/2018  Glucose 70 - 99 mg/dL 101(H) 89 151(H)  BUN 8 - 23 mg/dL _0 Creatinine 0.61 - 1.24 mg/dL 0.88 0.85 0.83  Sodium 135 - 145 mmol/L 140 144 138  Potassium 3.5 - 5.1 mmol/L 4.6 4.6 3.9  Chloride 98 - 111 mmol/L 103 107(H) 100  CO2 22 - 32 mmol/L _1 Calcium 8.9 - 10.3 mg/dL 9.7 9.9 9.4  Total Protein 6.5 - 8.1 g/dL 6.9 6.4 6.3  Total Bilirubin 0.3 - 1.2 mg/dL 1.1 0.8 1.7(H)  Alkaline Phos 38 - 126 U/L 71 119(H) 145(H)  AST 15 - 41 U/L 25 28 60(H)  ALT 0 - 44 U/L _2 All questions were answered to patient's stated satisfaction. Encouraged patient to call with any new concerns or questions before his next visit to the cancer center and we can certain see him sooner, if needed.     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 Immunofixation showed IgG lambda monoclonal protein. Free kappa light chains of 50.1, lambda light chains 41.8, ratio is normal at 1.20. -Hemoglobin was 12.5, creatinine 0.85, calcium 9.9. -24-hour urine total protein was less than 4. UPEP was negative. Immunofixation was also negative. -Skeletal survey did not show any lytic lesions. -Patient was educated about the normal pathophysiology of MGUS. There is a 1 %/year transformation to myeloma from MGUS. -Labs done on 11/12/2019 showed hemoglobin 12.7,  creatinine 0.88, calcium 9.7, M spike of 0.4 g/dL, free kappa light chains of 49.0, lambda light chains of 43.4, ratio at 1.13. -If labs are stable we will consider moving him out to every 6 months appointments. -Patient reports he is being seen by hematologist oncologist at the [facility] hospital now and no longer needs our care.  He reports they have done MRIs CAT scans and a bone marrow biopsy in the past couple months. -He will follow-up if he needs our care.  2. Moderate  thrombocytopenia: -CBC on 06/17/2019 showed platelet count of 95. -Denies any easy bleeding or bruising. 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 -We check for nutritional deficiencies which were within normal limits. Hepatitis serology was normal. ANA and rheumatoid factor was also normal. -Differential diagnosis includes MDS versus immune mediated thrombocytopenia. No further work-up needed at this time. -Labs done on 11/12/2019 showed platelet count of 78 -He will follow-up if he needs [place].  He is being seen at the [facility] now.     Orders placed this encounter:  No orders of the defined types were placed in this encounter.     Francene Finders, FNP-C [place] [phone]

## 2019-11-19 NOTE — Assessment & Plan Note (Addendum)
1. IgG lambda monoclonal gammopathy: -Labs on 06/30/2019 showed M spike of 0.5 g/dL. Immunofixation showed IgG lambda monoclonal protein. Free kappa light chains of 50.1, lambda light chains 41.8, ratio is normal at 1.20. -Hemoglobin was 12.5, creatinine 0.85, calcium 9.9. -24-hour urine total protein was less than 4. UPEP was negative. Immunofixation was also negative. -Skeletal survey did not show any lytic lesions. -Patient was educated about the normal pathophysiology of MGUS. There is a 1 %/year transformation to myeloma from MGUS. -Labs done on 11/12/2019 showed hemoglobin 12.7, creatinine 0.88, calcium 9.7, M spike of 0.4 g/dL, free kappa light chains of 49.0, lambda light chains of 43.4, ratio at 1.13. -If labs are stable we will consider moving him out to every 6 months appointments. -Patient reports he is being seen by hematologist oncologist at the [facility] hospital now and no longer needs our care.  He reports they have done MRIs CAT scans and a bone marrow biopsy in the past couple months. -He will follow-up if he needs our care.  2. Moderate thrombocytopenia: -CBC on 06/17/2019 showed platelet count of 95. -Denies any easy bleeding or bruising. 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 -We check for nutritional deficiencies which were within normal limits. Hepatitis serology was normal. ANA and rheumatoid factor was also normal. -Differential diagnosis includes MDS versus immune mediated thrombocytopenia. No further work-up needed at this time. -Labs done on 11/12/2019 showed platelet count of 78 -He will follow-up if he needs [place].  He is being seen at the [facility] now.

## 2019-11-26 ENCOUNTER — Other Ambulatory Visit: Payer: Self-pay | Admitting: *Deleted

## 2019-11-26 MED ORDER — TAMSULOSIN HCL 0.4 MG PO CAPS: ORAL_CAPSULE | ORAL | 0 refills | Status: DC

## 2019-11-30 ENCOUNTER — Other Ambulatory Visit: Payer: Self-pay | Admitting: *Deleted

## 2019-11-30 DIAGNOSIS — R69 Illness, unspecified: Secondary | ICD-10-CM | POA: Diagnosis not present

## 2019-11-30 MED ORDER — ONETOUCH VERIO VI STRP: ORAL_STRIP | 0 refills | Status: DC

## 2019-12-04 DIAGNOSIS — E785 Hyperlipidemia, unspecified: Secondary | ICD-10-CM | POA: Diagnosis not present

## 2019-12-04 DIAGNOSIS — G4730 Sleep apnea, unspecified: Secondary | ICD-10-CM | POA: Diagnosis not present

## 2019-12-04 DIAGNOSIS — Z008 Encounter for other general examination: Secondary | ICD-10-CM | POA: Diagnosis not present

## 2019-12-04 DIAGNOSIS — E669 Obesity, unspecified: Secondary | ICD-10-CM | POA: Diagnosis not present

## 2019-12-04 DIAGNOSIS — I2510 Atherosclerotic heart disease of native coronary artery without angina pectoris: Secondary | ICD-10-CM | POA: Diagnosis not present

## 2019-12-04 DIAGNOSIS — E1142 Type 2 diabetes mellitus with diabetic polyneuropathy: Secondary | ICD-10-CM | POA: Diagnosis not present

## 2019-12-04 DIAGNOSIS — N400 Enlarged prostate without lower urinary tract symptoms: Secondary | ICD-10-CM | POA: Diagnosis not present

## 2019-12-04 DIAGNOSIS — E538 Deficiency of other specified B group vitamins: Secondary | ICD-10-CM | POA: Diagnosis not present

## 2019-12-04 DIAGNOSIS — I10 Essential (primary) hypertension: Secondary | ICD-10-CM | POA: Diagnosis not present

## 2019-12-04 DIAGNOSIS — Z6831 Body mass index (BMI) 31.0-31.9, adult: Secondary | ICD-10-CM | POA: Diagnosis not present

## 2019-12-04 DIAGNOSIS — Z7901 Long term (current) use of anticoagulants: Secondary | ICD-10-CM | POA: Diagnosis not present

## 2019-12-05 ENCOUNTER — Other Ambulatory Visit: Payer: Self-pay | Admitting: Family Medicine

## 2019-12-07 ENCOUNTER — Other Ambulatory Visit: Payer: Self-pay | Admitting: *Deleted

## 2019-12-07 DIAGNOSIS — E119 Type 2 diabetes mellitus without complications: Secondary | ICD-10-CM

## 2019-12-07 MED ORDER — GLIPIZIDE 10 MG PO TABS: 10.0000 mg | ORAL_TABLET | Freq: Every day | ORAL | 0 refills | Status: DC

## 2019-12-07 NOTE — Telephone Encounter (Signed)
Patient has appointment 5/12.

## 2019-12-07 NOTE — Telephone Encounter (Signed)
Former [place] NTBS 30 days given 11/05/19

## 2019-12-09 ENCOUNTER — Encounter: Payer: Self-pay | Admitting: Family Medicine

## 2019-12-09 ENCOUNTER — Other Ambulatory Visit: Payer: Self-pay

## 2019-12-09 ENCOUNTER — Ambulatory Visit (INDEPENDENT_AMBULATORY_CARE_PROVIDER_SITE_OTHER): Payer: Medicare HMO | Admitting: Family Medicine

## 2019-12-09 VITALS — BP 138/68 | HR 64 | Temp 98.2°F | Resp 20 | Ht 69.0 in | Wt 219.2 lb

## 2019-12-09 DIAGNOSIS — K760 Fatty (change of) liver, not elsewhere classified: Secondary | ICD-10-CM

## 2019-12-09 DIAGNOSIS — I4891 Unspecified atrial fibrillation: Secondary | ICD-10-CM | POA: Diagnosis not present

## 2019-12-09 DIAGNOSIS — I10 Essential (primary) hypertension: Secondary | ICD-10-CM | POA: Diagnosis not present

## 2019-12-09 DIAGNOSIS — E119 Type 2 diabetes mellitus without complications: Secondary | ICD-10-CM | POA: Insufficient documentation

## 2019-12-09 DIAGNOSIS — H9113 Presbycusis, bilateral: Secondary | ICD-10-CM | POA: Diagnosis not present

## 2019-12-09 DIAGNOSIS — E1169 Type 2 diabetes mellitus with other specified complication: Secondary | ICD-10-CM

## 2019-12-09 DIAGNOSIS — I2510 Atherosclerotic heart disease of native coronary artery without angina pectoris: Secondary | ICD-10-CM | POA: Diagnosis not present

## 2019-12-09 DIAGNOSIS — E782 Mixed hyperlipidemia: Secondary | ICD-10-CM

## 2019-12-09 DIAGNOSIS — E756 Lipid storage disorder, unspecified: Secondary | ICD-10-CM

## 2019-12-09 DIAGNOSIS — E1140 Type 2 diabetes mellitus with diabetic neuropathy, unspecified: Secondary | ICD-10-CM | POA: Diagnosis not present

## 2019-12-09 LAB — BAYER DCA HB A1C WAIVED: HB A1C (BAYER DCA - WAIVED): 6.3 % (ref <7.0)

## 2019-12-09 MED ORDER — ATORVASTATIN CALCIUM 40 MG PO TABS: 40.0000 mg | ORAL_TABLET | Freq: Every day | ORAL | 1 refills | Status: DC

## 2019-12-09 NOTE — Progress Notes (Signed)
Subjective:  Patient ID: [name],  male    DOB: 01/30/1941  Age: 79 y.o.    CC: Medical Management of Chronic Issues   HPI [name] presents for  follow-up of hypertension. Patient has no history of headache chest pain or shortness of breath or recent cough. Patient also denies symptoms of TIA such as numbness weakness lateralizing. Patient denies side effects from medication. States taking it regularly.  Patient also  in for follow-up of elevated cholesterol. Doing well without complaints on current medication. Denies side effects  including myalgia and arthralgia and nausea. Also in today for liver function testing. Currently no chest pain, shortness of breath or other cardiovascular related symptoms noted.  Follow-up of diabetes. Patient does check blood sugar at home. Readings"good" Patient denies symptoms such as excessive hunger or urinary frequency, excessive hunger, nausea No significant hypoglycemic spells noted. Medications reviewed. Pt reports taking them regularly. Pt. denies complication/adverse reaction today.   Atrial fibrillation follow up. Pt. is treated with rate control and anticoagulation. Pt.  denies palpitations, rapid rate, chest pain, dyspnea and edema. There has been no bleeding from nose or gums. Pt. has not noticed blood with urine or stool.  Although there is routine bruising easily, it is not excessive.  Patient relates that he had a coronary stent placed over 10 years ago.  He does not remember any of the circumstances other than that it is present.  Patient reports that his feet are completely numb as result of a diabetic neuropathy they have been that way for years.  Additionally he was recently found to have a spot somewhere he cannot remember where.  But [facility] and [facility] and other tests as a result.  Tomorrow he has an appointment at the [facility] with Dr. Merrie Roof.  He says at that time he will try to get more information.  He also  promises to stop by records and pull records for his recent colonoscopy and cataract surgery as well as the MRI and CT and is much as he can on the spot that he has.  It is unclear if the spot is cancerous or what its locations.    History [name] has a past medical history of Coronary atherosclerosis of unspecified type of vessel, native or graft, Diabetes ([place]), Diverticulosis, Hepatomegaly, colonic polyps, Obesity, unspecified, Obstructive sleep apnea, Other and unspecified hyperlipidemia, and Unspecified essential hypertension.   He has a past surgical history that includes Coronary stent placement (08/2006); Colonoscopy (9/17/20012); Polypectomy (09/17/20012); and Tonsillectomy.   His family history includes Cancer in his mother; Healthy in his son; Heart attack in his father; Heart disease in his father; Hypertension in his mother and sister; Irritable bowel syndrome in his maternal grandmother.He reports that he quit smoking about 48 years ago. His smoking use included cigarettes. He smoked 1.00 pack per day. He quit smokeless tobacco use about 30 years ago.  His smokeless tobacco use included chew. He reports current alcohol use of about 2.0 standard drinks of alcohol per week. He reports that he does not use drugs.  Current Outpatient Medications on File Prior to Visit  Medication Sig Dispense Refill  . Blood Glucose Monitoring Suppl (ONETOUCH VERIO REFLECT) w/Device KIT See admin instructions.    . Blood Pressure Monitoring (CVS SERIES 100 BLOOD PRESSURE) DEVI USE AS DIRECTED TO CHECK BLOOD PRESSURE    . cyanocobalamin 1000 MCG tablet Take 1,000 mcg by mouth daily.    Marland Kitchen  glipiZIDE (GLUCOTROL) 10 MG tablet Take 1 tablet (10 mg total) by mouth daily before breakfast. 30 tablet 0  . lisinopril (ZESTRIL) 40 MG tablet TAKE 1 TABLET BY MOUTH EVERY DAY 90 tablet 1  . MAGNESIUM PO Take 420 mg by mouth.    . metoprolol succinate (TOPROL-XL) 50 MG 24 hr tablet Take 1 tablet (50 mg total) by mouth  daily. Take with or immediately following a meal. 90 tablet 3  . Multiple Vitamins-Minerals (CENTRUM SILVER PO) Take 1 capsule by mouth daily.      . nitroGLYCERIN (NITROSTAT) 0.4 MG SL tablet DISSOLVE 1 TABLET UNDER TONGUE AS NEEDED FOR CHEST PAIN 25 tablet prn  . OneTouch Delica Lancets 09X MISC TEST 3 TIME DAILY 100 each 1  . ONETOUCH VERIO test strip Test BS TID R73.9 100 strip 0  . tamsulosin (FLOMAX) 0.4 MG CAPS capsule TAKE 1 CAPSULE BY MOUTH EVERYDAY AT BEDTIME (Needs to be seen before next refill) 30 capsule 0  . XARELTO 20 MG TABS tablet TAKE 1 TABLET (20 MG TOTAL) BY MOUTH DAILY WITH SUPPER. 90 tablet 1  . metFORMIN (GLUCOPHAGE) 500 MG tablet Take 1 tablet (500 mg total) by mouth 2 (two) times daily with a meal. (Patient not taking: Reported on 12/09/2019) 180 tablet 3   No current facility-administered medications on file prior to visit.    ROS Review of Systems  Constitutional: Negative.   HENT: Positive for hearing loss (wears aides).   Eyes: Negative for visual disturbance.  Respiratory: Negative for cough and shortness of breath.   Cardiovascular: Negative for chest pain and leg swelling.  Gastrointestinal: Negative for abdominal pain, diarrhea, nausea and vomiting.  Genitourinary: Negative for difficulty urinating.  Musculoskeletal: Negative for arthralgias and myalgias.  Skin: Negative for rash.  Neurological: Positive for numbness (feet completely numb from DM neuropathy). Negative for headaches.  Psychiatric/Behavioral: Negative for sleep disturbance.    Objective:  BP 138/68   Pulse 64   Temp 98.2 F (36.8 C) (Temporal)   Resp 20   Ht '5\' 9"'$  (1.753 m)   Wt 219 lb 4 oz (99.5 kg)   SpO2 98%   BMI 32.38 kg/m   BP Readings from Last 3 Encounters:  12/09/19 138/68  11/19/19 (!) 135/54  11/09/19 (!) 125/53    Wt Readings from Last 3 Encounters:  12/09/19 219 lb 4 oz (99.5 kg)  11/19/19 214 lb 12.8 oz (97.4 kg)  11/09/19 217 lb 8 oz (98.7 kg)      Physical Exam Vitals reviewed.  Constitutional:      Appearance: He is well-developed.  HENT:     Head: Normocephalic and atraumatic.     Right Ear: Tympanic membrane and external ear normal. No decreased hearing noted.     Left Ear: Tympanic membrane and external ear normal. No decreased hearing noted.     Mouth/Throat:     Pharynx: No oropharyngeal exudate or posterior oropharyngeal erythema.  Eyes:     Pupils: Pupils are equal, round, and reactive to light.  Cardiovascular:     Rate and Rhythm: Normal rate and regular rhythm.     Heart sounds: No murmur.  Pulmonary:     Effort: No respiratory distress.     Breath sounds: Normal breath sounds.  Abdominal:     General: Bowel sounds are normal.     Palpations: Abdomen is soft. There is no mass.     Tenderness: There is no abdominal tenderness.  Musculoskeletal:     Cervical back:  Normal range of motion and neck supple.     Diabetic Foot Exam - Simple   Simple Foot Form Diabetic Foot exam was performed with the following findings: Yes 12/09/2019  8:54 AM  Visual Inspection No deformities, no ulcerations, no other skin breakdown bilaterally: Yes Sensation Testing See comments: Yes Pulse Check See comments: Yes Comments The patient's monofilament test reveals numbness throughout the entire foot bilaterally.  His only felt area was above the ankle bilaterally.  His pulses are diminished throughout.  The feet are free of deformities.       Assessment & Plan:   Yosiel was seen today for medical management of chronic issues.  Diagnoses and all orders for this visit:  Essential hypertension -     CBC with Differential/Platelet -     CMP14+EGFR -     Lipid panel  Diabetic lipidosis (HCC) -     Bayer DCA Hb A1c Waived -     CBC with Differential/Platelet -     CMP14+EGFR -     Lipid panel  Mixed hyperlipidemia  Atherosclerosis of native coronary artery of native heart without angina pectoris  Fatty liver  disease, nonalcoholic  Atrial fibrillation, unspecified type (HCC)  Type 2 diabetes mellitus with diabetic neuropathy, without long-term current use of insulin (HCC)  Presbycusis of both ears  Other orders -     atorvastatin (LIPITOR) 40 MG tablet; Take 1 tablet (40 mg total) by mouth daily. TAKE 1 TABLET 2 TIMES A WEEK (Needs to be seen before next refill)   I have discontinued [name]'s HYDROcodone-acetaminophen. I have also changed his atorvastatin. Additionally, I am having him maintain his Multiple Vitamins-Minerals (CENTRUM SILVER PO), cyanocobalamin, metoprolol succinate, metFORMIN, OneTouch Verio Reflect, OneTouch Delica Lancets 01U, nitroGLYCERIN, MAGNESIUM PO, lisinopril, Xarelto, CVS Series 100 Blood Pressure, tamsulosin, OneTouch Verio, and glipiZIDE.  Meds ordered this encounter  Medications  . atorvastatin (LIPITOR) 40 MG tablet    Sig: Take 1 tablet (40 mg total) by mouth daily. TAKE 1 TABLET 2 TIMES A WEEK (Needs to be seen before next refill)    Dispense:  90 tablet    Refill:  1   Patient is shared with the [facility] making work-up a bit more difficult as we have no information from them about his recent care.  I will have to rely on him to bring information about the spot that they are tracking.  I would like to have colonoscopy information for health maintenance purposes as well as records of his eye exams for diabetes.  He states that his liver has been affected by Lipitor.  However he also carries a diagnosis of fatty liver.  I would like to see a result from today's liver functions before making a determination.  However, I do think that if at all possible he should be on this daily.  I will go ahead and write it that way.  We can always cut back by phone.  Follow-up: No follow-ups on file.  Claretta Fraise, M.D.

## 2019-12-10 LAB — CBC WITH DIFFERENTIAL/PLATELET
Basophils Absolute: 0 10*3/uL (ref 0.0–0.2)
Basos: 1 %
EOS (ABSOLUTE): 0.1 10*3/uL (ref 0.0–0.4)
Eos: 2 %
Hematocrit: 34.1 % — ABNORMAL LOW (ref 37.5–51.0)
Hemoglobin: 11.9 g/dL — ABNORMAL LOW (ref 13.0–17.7)
Immature Grans (Abs): 0 10*3/uL (ref 0.0–0.1)
Immature Granulocytes: 0 %
Lymphocytes Absolute: 1 10*3/uL (ref 0.7–3.1)
Lymphs: 24 %
MCH: 33.1 pg — ABNORMAL HIGH (ref 26.6–33.0)
MCHC: 34.9 g/dL (ref 31.5–35.7)
MCV: 95 fL (ref 79–97)
Monocytes Absolute: 0.3 10*3/uL (ref 0.1–0.9)
Monocytes: 8 %
Neutrophils Absolute: 2.6 10*3/uL (ref 1.4–7.0)
Neutrophils: 65 %
Platelets: 74 10*3/uL — CL (ref 150–450)
RBC: 3.59 x10E6/uL — ABNORMAL LOW (ref 4.14–5.80)
RDW: 12.3 % (ref 11.6–15.4)
WBC: 4.1 10*3/uL (ref 3.4–10.8)

## 2019-12-10 LAB — CMP14+EGFR
ALT: 20 IU/L (ref 0–44)
AST: 27 IU/L (ref 0–40)
Albumin/Globulin Ratio: 2 (ref 1.2–2.2)
Albumin: 3.9 g/dL (ref 3.7–4.7)
Alkaline Phosphatase: 89 IU/L (ref 39–117)
BUN/Creatinine Ratio: 17 (ref 10–24)
BUN: 16 mg/dL (ref 8–27)
Bilirubin Total: 0.7 mg/dL (ref 0.0–1.2)
CO2: 25 mmol/L (ref 20–29)
Calcium: 9.1 mg/dL (ref 8.6–10.2)
Chloride: 104 mmol/L (ref 96–106)
Creatinine, Ser: 0.95 mg/dL (ref 0.76–1.27)
GFR calc Af Amer: 89 mL/min/{1.73_m2} (ref >59)
GFR calc non Af Amer: 77 mL/min/{1.73_m2} (ref >59)
Globulin, Total: 2 g/dL (ref 1.5–4.5)
Glucose: 236 mg/dL — ABNORMAL HIGH (ref 65–99)
Potassium: 4.7 mmol/L (ref 3.5–5.2)
Sodium: 140 mmol/L (ref 134–144)
Total Protein: 5.9 g/dL — ABNORMAL LOW (ref 6.0–8.5)

## 2019-12-10 LAB — LIPID PANEL
Chol/HDL Ratio: 2.6 ratio (ref 0.0–5.0)
Cholesterol, Total: 116 mg/dL (ref 100–199)
HDL: 45 mg/dL (ref >39)
LDL Chol Calc (NIH): 55 mg/dL (ref 0–99)
Triglycerides: 80 mg/dL (ref 0–149)
VLDL Cholesterol Cal: 16 mg/dL (ref 5–40)

## 2019-12-11 ENCOUNTER — Telehealth: Payer: Self-pay | Admitting: Family Medicine

## 2019-12-11 NOTE — Telephone Encounter (Signed)
For provider.

## 2019-12-14 ENCOUNTER — Encounter: Payer: Self-pay | Admitting: Family Medicine

## 2019-12-15 ENCOUNTER — Other Ambulatory Visit: Payer: Self-pay | Admitting: *Deleted

## 2019-12-15 DIAGNOSIS — D649 Anemia, unspecified: Secondary | ICD-10-CM

## 2019-12-16 NOTE — Telephone Encounter (Signed)
Lmtcb.

## 2019-12-16 NOTE — Telephone Encounter (Signed)
ENDOSCOPY AND COLONOSCOPY

## 2019-12-16 NOTE — Telephone Encounter (Signed)
What test did he have done, for what purpose?

## 2019-12-18 NOTE — Telephone Encounter (Signed)
Can you get the [facility] to [facility] results?

## 2019-12-18 NOTE — Telephone Encounter (Signed)
Left message to please call our office.  Phone number does not work for the doctor.  Could patient get a copy of results for endoscopy and colonoscopy or have [facility] send to [place]?

## 2019-12-18 NOTE — Telephone Encounter (Signed)
Patient will try to have [facility] fax results to [facility] for provider to review.  Patient says number for Dr. Merrie Roof is [phone].

## 2019-12-24 ENCOUNTER — Other Ambulatory Visit: Payer: Medicare HMO

## 2019-12-24 ENCOUNTER — Other Ambulatory Visit: Payer: Self-pay

## 2019-12-24 DIAGNOSIS — D649 Anemia, unspecified: Secondary | ICD-10-CM | POA: Diagnosis not present

## 2019-12-25 LAB — CBC WITH DIFFERENTIAL/PLATELET
Basophils Absolute: 0 10*3/uL (ref 0.0–0.2)
Basos: 1 %
EOS (ABSOLUTE): 0.1 10*3/uL (ref 0.0–0.4)
Eos: 2 %
Hematocrit: 34.8 % — ABNORMAL LOW (ref 37.5–51.0)
Hemoglobin: 12.5 g/dL — ABNORMAL LOW (ref 13.0–17.7)
Immature Grans (Abs): 0 10*3/uL (ref 0.0–0.1)
Immature Granulocytes: 0 %
Lymphocytes Absolute: 1.6 10*3/uL (ref 0.7–3.1)
Lymphs: 25 %
MCH: 33.4 pg — ABNORMAL HIGH (ref 26.6–33.0)
MCHC: 35.9 g/dL — ABNORMAL HIGH (ref 31.5–35.7)
MCV: 93 fL (ref 79–97)
Monocytes Absolute: 0.6 10*3/uL (ref 0.1–0.9)
Monocytes: 10 %
Neutrophils Absolute: 4 10*3/uL (ref 1.4–7.0)
Neutrophils: 62 %
Platelets: 87 10*3/uL — CL (ref 150–450)
RBC: 3.74 x10E6/uL — ABNORMAL LOW (ref 4.14–5.80)
RDW: 12.8 % (ref 11.6–15.4)
WBC: 6.4 10*3/uL (ref 3.4–10.8)

## 2020-01-01 ENCOUNTER — Other Ambulatory Visit: Payer: Self-pay | Admitting: Family Medicine

## 2020-01-02 ENCOUNTER — Other Ambulatory Visit: Payer: Self-pay | Admitting: Family

## 2020-01-02 DIAGNOSIS — E119 Type 2 diabetes mellitus without complications: Secondary | ICD-10-CM

## 2020-01-11 ENCOUNTER — Telehealth: Payer: Self-pay | Admitting: Family Medicine

## 2020-01-13 ENCOUNTER — Other Ambulatory Visit: Payer: Self-pay | Admitting: *Deleted

## 2020-01-13 DIAGNOSIS — I10 Essential (primary) hypertension: Secondary | ICD-10-CM

## 2020-01-13 MED ORDER — LISINOPRIL 40 MG PO TABS: 40.0000 mg | ORAL_TABLET | Freq: Every day | ORAL | 1 refills | Status: DC

## 2020-01-15 ENCOUNTER — Telehealth: Payer: Self-pay | Admitting: Family Medicine

## 2020-01-15 NOTE — Telephone Encounter (Signed)
OV note faxed to Dr. Domenica Fail with the [facility] [phone]

## 2020-01-15 NOTE — Telephone Encounter (Signed)
Pt would like to speak to the nurse about his last visit. Would not provider any further detail.

## 2020-01-23 ENCOUNTER — Other Ambulatory Visit: Payer: Self-pay | Admitting: Family Medicine

## 2020-01-23 DIAGNOSIS — R69 Illness, unspecified: Secondary | ICD-10-CM | POA: Diagnosis not present

## 2020-01-25 DIAGNOSIS — R69 Illness, unspecified: Secondary | ICD-10-CM | POA: Diagnosis not present

## 2020-03-09 ENCOUNTER — Other Ambulatory Visit: Payer: Self-pay

## 2020-03-09 ENCOUNTER — Ambulatory Visit (INDEPENDENT_AMBULATORY_CARE_PROVIDER_SITE_OTHER): Payer: Medicare HMO | Admitting: Family Medicine

## 2020-03-09 ENCOUNTER — Encounter: Payer: Self-pay | Admitting: Family Medicine

## 2020-03-09 VITALS — BP 111/62 | HR 62 | Temp 98.5°F | Ht 69.0 in | Wt 219.2 lb

## 2020-03-09 DIAGNOSIS — E1140 Type 2 diabetes mellitus with diabetic neuropathy, unspecified: Secondary | ICD-10-CM | POA: Diagnosis not present

## 2020-03-09 DIAGNOSIS — E119 Type 2 diabetes mellitus without complications: Secondary | ICD-10-CM

## 2020-03-09 DIAGNOSIS — E1169 Type 2 diabetes mellitus with other specified complication: Secondary | ICD-10-CM

## 2020-03-09 DIAGNOSIS — I10 Essential (primary) hypertension: Secondary | ICD-10-CM | POA: Diagnosis not present

## 2020-03-09 DIAGNOSIS — E756 Lipid storage disorder, unspecified: Secondary | ICD-10-CM

## 2020-03-09 LAB — BAYER DCA HB A1C WAIVED: HB A1C (BAYER DCA - WAIVED): 6.1 % (ref <7.0)

## 2020-03-09 NOTE — Progress Notes (Addendum)
Subjective:  Patient ID: [name], male    DOB: 25-Dec-1940  Age: 79 y.o. MRN: [id number]  CC: Follow-up (3 month)   HPI [name] presents for  follow-up of hypertension. Patient has no history of headache chest pain or shortness of breath or recent cough. Patient also denies symptoms of TIA such as focal numbness or weakness. Patient denies side effects from medication. States taking it regularly.  presents forFollow-up of diabetes. Patient checks blood sugar at home.   60-80 fasting and DCed metformin and glucose went to 110-140. Patient denies symptoms such as polyuria, polydipsia, excessive hunger, nausea No significant hypoglycemic spells noted. Medications reviewed. Pt reports taking them regularly without complication/adverse reaction being reported today.  Checking feet daily.   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   He has a past surgical history that includes Coronary stent placement (08/2006); Colonoscopy (9/17/20012); Polypectomy (09/17/20012); and Tonsillectomy.   His family history includes Cancer in his mother; Healthy in his son; Heart attack in his father; Heart disease in his father; Hypertension in his mother and sister; Irritable bowel syndrome in his maternal grandmother.He reports that he quit smoking about 49 years ago. His smoking use included cigarettes. He smoked 1.00 pack per day. He quit smokeless tobacco use about 30 years ago.  His smokeless tobacco use included chew. He reports current alcohol use of about 2.0 standard drinks of alcohol per week. He reports that he does not use  drugs.  Current Outpatient Medications on File Prior to Visit  Medication Sig Dispense Refill  . atorvastatin (LIPITOR) 40 MG tablet Take 1 tablet (40 mg total) by mouth daily. TAKE 1 TABLET 2 TIMES A WEEK (Needs to be seen before next refill) 90 tablet 1  . Blood Glucose Monitoring Suppl (ONETOUCH VERIO REFLECT) w/Device KIT See admin instructions.    . Blood Pressure Monitoring (CVS SERIES 100 BLOOD PRESSURE) DEVI USE AS DIRECTED TO CHECK BLOOD PRESSURE    . cyanocobalamin 1000 MCG tablet Take 1,000 mcg by mouth daily.    Marland Kitchen lisinopril (ZESTRIL) 40 MG tablet Take 1 tablet (40 mg total) by mouth daily. 90 tablet 1  . MAGNESIUM PO Take 420 mg by mouth.    . metoprolol succinate (TOPROL-XL) 50 MG 24 hr tablet Take 1 tablet (50 mg total) by mouth daily. Take with or immediately following a meal. 90 tablet 3  . Multiple Vitamins-Minerals (CENTRUM SILVER PO) Take 1 capsule by mouth daily.      [name] Delica Lancets 26R MISC TEST 3 TIME DAILY 100 each 1  . ONETOUCH VERIO test strip TEST BS TID R73.9 100 strip 10  . tamsulosin (FLOMAX) 0.4 MG CAPS capsule Take 1 capsule (0.4 mg total) by mouth daily after supper. TAKE 1 CAPSULE BY MOUTH EVERYDAY AT BEDTIME 90 capsule 1  . XARELTO 20 MG TABS tablet TAKE 1 TABLET (20 MG TOTAL) BY MOUTH DAILY WITH SUPPER. 90 tablet 1  . metFORMIN (GLUCOPHAGE) 500 MG tablet Take 1 tablet (500 mg total) by mouth 2 (two) times daily with a meal. (Patient not taking: Reported on 12/09/2019) 180 tablet 3  . nitroGLYCERIN (NITROSTAT) 0.4 MG SL tablet DISSOLVE 1 TABLET UNDER TONGUE AS NEEDED FOR CHEST PAIN (Patient not taking: Reported on  03/09/2020) 25 tablet prn   No current facility-administered medications on file prior to visit.    ROS Review of Systems  Constitutional: Negative.   HENT: Negative.   Eyes: Negative for visual disturbance.  Respiratory: Negative for cough and shortness of breath.   Cardiovascular: Negative for chest pain and leg swelling.   Gastrointestinal: Negative for abdominal pain, diarrhea, nausea and vomiting.  Genitourinary: Negative for difficulty urinating.  Musculoskeletal: Positive for gait problem (decreased balance) and myalgias. Negative for arthralgias.  Skin: Negative for rash.  Neurological: Negative for headaches.  Psychiatric/Behavioral: Negative for sleep disturbance.    Objective:  BP 111/62   Pulse 62   Temp 98.5 F (36.9 C) (Temporal)   Ht _0  (1.753 m)   Wt 219 lb 3.2 oz (99.4 kg)   BMI 32.37 kg/m   BP Readings from Last 3 Encounters:  03/09/20 111/62  12/09/19 138/68  11/19/19 (!) 135/54    Wt Readings from Last 3 Encounters:  03/09/20 219 lb 3.2 oz (99.4 kg)  12/09/19 219 lb 4 oz (99.5 kg)  11/19/19 214 lb 12.8 oz (97.4 kg)     Physical Exam    Assessment & Plan:   [name] was seen today for follow-up.  Diagnoses and all orders for this visit:  Essential hypertension -     CBC with Differential/Platelet -     CMP14+EGFR  Diabetic lipidosis (HCC) -     CBC with Differential/Platelet -     CMP14+EGFR  Type 2 diabetes mellitus with diabetic neuropathy, without long-term current use of insulin (HCC) -     CBC with Differential/Platelet -     CMP14+EGFR -     Bayer DCA Hb A1c Waived  Diabetes mellitus without complication (HCC) -     glipiZIDE (GLUCOTROL) 10 MG tablet; Take 1 tablet (10 mg total) by mouth daily before breakfast.   Allergies as of 03/09/2020   No Known Allergies     Medication List       Accurate as of March 09, 2020 11:59 PM. If you have any questions, ask your nurse or doctor.        atorvastatin 40 MG tablet Commonly known as: LIPITOR Take 1 tablet (40 mg total) by mouth daily. TAKE 1 TABLET 2 TIMES A WEEK (Needs to be seen before next refill)   CENTRUM SILVER PO Take 1 capsule by mouth daily.   CVS Series 100 Blood Pressure Devi USE AS DIRECTED TO CHECK BLOOD PRESSURE   cyanocobalamin 1000 MCG tablet Take 1,000 mcg by mouth  daily.   glipiZIDE 10 MG tablet Commonly known as: GLUCOTROL Take 1 tablet (10 mg total) by mouth daily before breakfast.   lisinopril 40 MG tablet Commonly known as: ZESTRIL Take 1 tablet (40 mg total) by mouth daily.   MAGNESIUM PO Take 420 mg by mouth.   metFORMIN 500 MG tablet Commonly known as: GLUCOPHAGE Take 1 tablet (500 mg total) by mouth 2 (two) times daily with a meal.   metoprolol succinate 50 MG 24 hr tablet Commonly known as: TOPROL-XL Take 1 tablet (50 mg total) by mouth daily. Take with or immediately following a meal.   nitroGLYCERIN 0.4 MG SL tablet Commonly known as: NITROSTAT DISSOLVE 1 TABLET UNDER TONGUE AS NEEDED FOR CHEST PAIN   OneTouch Delica Lancets 49Z Misc TEST 3 TIME DAILY   OneTouch Verio Reflect w/Device Kit See admin instructions.   OneTouch Verio test strip Generic drug: glucose blood TEST BS TID R73.9  tamsulosin 0.4 MG Caps capsule Commonly known as: FLOMAX Take 1 capsule (0.4 mg total) by mouth daily after supper. TAKE 1 CAPSULE BY MOUTH EVERYDAY AT BEDTIME   Xarelto 20 MG Tabs tablet Generic drug: rivaroxaban TAKE 1 TABLET (20 MG TOTAL) BY MOUTH DAILY WITH SUPPER.       Meds ordered this encounter  Medications  . glipiZIDE (GLUCOTROL) 10 MG tablet    Sig: Take 1 tablet (10 mg total) by mouth daily before breakfast.    Dispense:  90 tablet    Refill:  1      Follow-up: Return in about 3 months (around 06/09/2020).  Claretta Fraise, M.D.

## 2020-03-10 LAB — CMP14+EGFR
ALT: 20 IU/L (ref 0–44)
AST: 24 IU/L (ref 0–40)
Albumin/Globulin Ratio: 1.9 (ref 1.2–2.2)
Albumin: 3.7 g/dL (ref 3.7–4.7)
Alkaline Phosphatase: 86 IU/L (ref 48–121)
BUN/Creatinine Ratio: 17 (ref 10–24)
BUN: 15 mg/dL (ref 8–27)
Bilirubin Total: 0.8 mg/dL (ref 0.0–1.2)
CO2: 24 mmol/L (ref 20–29)
Calcium: 9 mg/dL (ref 8.6–10.2)
Chloride: 102 mmol/L (ref 96–106)
Creatinine, Ser: 0.9 mg/dL (ref 0.76–1.27)
GFR calc Af Amer: 94 mL/min/{1.73_m2} (ref >59)
GFR calc non Af Amer: 82 mL/min/{1.73_m2} (ref >59)
Globulin, Total: 2 g/dL (ref 1.5–4.5)
Glucose: 268 mg/dL — ABNORMAL HIGH (ref 65–99)
Potassium: 4.8 mmol/L (ref 3.5–5.2)
Sodium: 137 mmol/L (ref 134–144)
Total Protein: 5.7 g/dL — ABNORMAL LOW (ref 6.0–8.5)

## 2020-03-10 LAB — CBC WITH DIFFERENTIAL/PLATELET
Basophils Absolute: 0 10*3/uL (ref 0.0–0.2)
Basos: 1 %
EOS (ABSOLUTE): 0.1 10*3/uL (ref 0.0–0.4)
Eos: 1 %
Hematocrit: 35.1 % — ABNORMAL LOW (ref 37.5–51.0)
Hemoglobin: 12.1 g/dL — ABNORMAL LOW (ref 13.0–17.7)
Immature Grans (Abs): 0 10*3/uL (ref 0.0–0.1)
Immature Granulocytes: 0 %
Lymphocytes Absolute: 1 10*3/uL (ref 0.7–3.1)
Lymphs: 23 %
MCH: 32.8 pg (ref 26.6–33.0)
MCHC: 34.5 g/dL (ref 31.5–35.7)
MCV: 95 fL (ref 79–97)
Monocytes Absolute: 0.3 10*3/uL (ref 0.1–0.9)
Monocytes: 7 %
Neutrophils Absolute: 2.8 10*3/uL (ref 1.4–7.0)
Neutrophils: 68 %
Platelets: 69 10*3/uL — CL (ref 150–450)
RBC: 3.69 x10E6/uL — ABNORMAL LOW (ref 4.14–5.80)
RDW: 12 % (ref 11.6–15.4)
WBC: 4.1 10*3/uL (ref 3.4–10.8)

## 2020-03-17 ENCOUNTER — Other Ambulatory Visit (HOSPITAL_COMMUNITY): Payer: Medicare HMO

## 2020-03-19 ENCOUNTER — Encounter: Payer: Self-pay | Admitting: Family Medicine

## 2020-03-19 MED ORDER — GLIPIZIDE 10 MG PO TABS: 10.0000 mg | ORAL_TABLET | Freq: Every day | ORAL | 1 refills | Status: DC

## 2020-03-24 ENCOUNTER — Ambulatory Visit (HOSPITAL_COMMUNITY): Payer: Medicare HMO | Admitting: Nurse Practitioner

## 2020-03-24 DIAGNOSIS — G4733 Obstructive sleep apnea (adult) (pediatric): Secondary | ICD-10-CM | POA: Diagnosis not present

## 2020-03-29 DIAGNOSIS — R69 Illness, unspecified: Secondary | ICD-10-CM | POA: Diagnosis not present

## 2020-04-09 ENCOUNTER — Other Ambulatory Visit: Payer: Self-pay | Admitting: Cardiology

## 2020-05-08 DIAGNOSIS — R69 Illness, unspecified: Secondary | ICD-10-CM | POA: Diagnosis not present

## 2020-05-27 DIAGNOSIS — R69 Illness, unspecified: Secondary | ICD-10-CM | POA: Diagnosis not present

## 2020-06-09 ENCOUNTER — Ambulatory Visit (INDEPENDENT_AMBULATORY_CARE_PROVIDER_SITE_OTHER): Payer: Medicare HMO | Admitting: Family Medicine

## 2020-06-09 ENCOUNTER — Other Ambulatory Visit: Payer: Self-pay

## 2020-06-09 ENCOUNTER — Encounter: Payer: Self-pay | Admitting: Family Medicine

## 2020-06-09 VITALS — BP 126/57 | HR 63 | Temp 98.1°F | Resp 20 | Ht 69.0 in | Wt 225.0 lb

## 2020-06-09 DIAGNOSIS — I10 Essential (primary) hypertension: Secondary | ICD-10-CM

## 2020-06-09 DIAGNOSIS — E1140 Type 2 diabetes mellitus with diabetic neuropathy, unspecified: Secondary | ICD-10-CM

## 2020-06-09 DIAGNOSIS — E782 Mixed hyperlipidemia: Secondary | ICD-10-CM | POA: Diagnosis not present

## 2020-06-09 DIAGNOSIS — R29898 Other symptoms and signs involving the musculoskeletal system: Secondary | ICD-10-CM

## 2020-06-09 DIAGNOSIS — R42 Dizziness and giddiness: Secondary | ICD-10-CM

## 2020-06-09 LAB — CBC WITH DIFFERENTIAL/PLATELET

## 2020-06-09 LAB — BAYER DCA HB A1C WAIVED: HB A1C (BAYER DCA - WAIVED): 6.1 % (ref <7.0)

## 2020-06-10 LAB — CMP14+EGFR
ALT: 20 IU/L (ref 0–44)
AST: 21 IU/L (ref 0–40)
Albumin/Globulin Ratio: 1.6 (ref 1.2–2.2)
Albumin: 3.7 g/dL (ref 3.7–4.7)
Alkaline Phosphatase: 95 IU/L (ref 44–121)
BUN/Creatinine Ratio: 11 (ref 10–24)
BUN: 11 mg/dL (ref 8–27)
Bilirubin Total: 0.7 mg/dL (ref 0.0–1.2)
CO2: 23 mmol/L (ref 20–29)
Calcium: 9.2 mg/dL (ref 8.6–10.2)
Chloride: 105 mmol/L (ref 96–106)
Creatinine, Ser: 0.96 mg/dL (ref 0.76–1.27)
GFR calc Af Amer: 87 mL/min/{1.73_m2} (ref >59)
GFR calc non Af Amer: 75 mL/min/{1.73_m2} (ref >59)
Globulin, Total: 2.3 g/dL (ref 1.5–4.5)
Glucose: 206 mg/dL — ABNORMAL HIGH (ref 65–99)
Potassium: 4.4 mmol/L (ref 3.5–5.2)
Sodium: 141 mmol/L (ref 134–144)
Total Protein: 6 g/dL (ref 6.0–8.5)

## 2020-06-10 LAB — CBC WITH DIFFERENTIAL/PLATELET
Basophils Absolute: 0.1 10*3/uL (ref 0.0–0.2)
Basos: 1 %
EOS (ABSOLUTE): 0.1 10*3/uL (ref 0.0–0.4)
Eos: 2 %
Hematocrit: 36.2 % — ABNORMAL LOW (ref 37.5–51.0)
Hemoglobin: 12.2 g/dL — ABNORMAL LOW (ref 13.0–17.7)
Immature Grans (Abs): 0 10*3/uL (ref 0.0–0.1)
Immature Granulocytes: 0 %
Lymphocytes Absolute: 1.2 10*3/uL (ref 0.7–3.1)
Lymphs: 23 %
MCH: 32.2 pg (ref 26.6–33.0)
MCHC: 33.7 g/dL (ref 31.5–35.7)
MCV: 96 fL (ref 79–97)
Monocytes Absolute: 0.4 10*3/uL (ref 0.1–0.9)
Monocytes: 8 %
Neutrophils Absolute: 3.3 10*3/uL (ref 1.4–7.0)
Neutrophils: 66 %
Platelets: 74 10*3/uL — CL (ref 150–450)
RBC: 3.79 x10E6/uL — ABNORMAL LOW (ref 4.14–5.80)
RDW: 11.9 % (ref 11.6–15.4)
WBC: 5 10*3/uL (ref 3.4–10.8)

## 2020-06-10 LAB — LIPID PANEL
Chol/HDL Ratio: 2.4 ratio (ref 0.0–5.0)
Cholesterol, Total: 94 mg/dL — ABNORMAL LOW (ref 100–199)
HDL: 40 mg/dL (ref >39)
LDL Chol Calc (NIH): 36 mg/dL (ref 0–99)
Triglycerides: 92 mg/dL (ref 0–149)
VLDL Cholesterol Cal: 18 mg/dL (ref 5–40)

## 2020-06-11 NOTE — Progress Notes (Signed)
Hello [name],  Your lab result is normal and/or stable.Some minor variations that are not significant are commonly marked abnormal, but do not represent any medical problem for you.  Best regards, Willaim Mode, M.D.

## 2020-06-12 ENCOUNTER — Encounter: Payer: Self-pay | Admitting: Family Medicine

## 2020-06-12 MED ORDER — ATORVASTATIN CALCIUM 40 MG PO TABS
40.0000 mg | ORAL_TABLET | Freq: Every day | ORAL | 1 refills | Status: DC
Start: 2020-06-12 — End: 2020-12-08

## 2020-06-12 MED ORDER — LISINOPRIL 40 MG PO TABS: 40.0000 mg | ORAL_TABLET | Freq: Every day | ORAL | 1 refills | Status: DC

## 2020-06-12 MED ORDER — RIVAROXABAN 20 MG PO TABS
ORAL_TABLET | ORAL | 1 refills | Status: DC
Start: 2020-06-12 — End: 2020-12-08

## 2020-06-12 MED ORDER — TAMSULOSIN HCL 0.4 MG PO CAPS
0.4000 mg | ORAL_CAPSULE | Freq: Every day | ORAL | 1 refills | Status: DC
Start: 2020-06-12 — End: 2020-12-08

## 2020-06-12 NOTE — Progress Notes (Signed)
Subjective:  Patient ID: [name],  male    DOB: [birth year]  Age: 79 y.o.    CC: Medical Management of Chronic Issues   HPI [name] presents for  follow-up of hypertension. Patient has no history of headache chest pain or shortness of breath or recent cough. Patient also denies symptoms of TIA such as numbness weakness lateralizing. Patient denies side effects from medication. States taking it regularly.  Patient also  in for follow-up of elevated cholesterol. Doing well without complaints on current medication. Denies side effects  including myalgia and arthralgia and nausea. Also in today for liver function testing. Currently no chest pain, shortness of breath or other cardiovascular related symptoms noted.  Follow-up of diabetes.  This is generally followed by the [facility].  He was recently taken off of his Metformin. Patient denies symptoms such as excessive hunger or urinary frequency, excessive hunger, nausea No significant hypoglycemic spells noted. Medications reviewed. Pt reports taking them regularly. Pt. denies complication/adverse reaction today.   Patient's main concern today is that his legs are weak.  This is symmetric.  He is able to ambulate.  He just does not feel good strength in them.  He says he walks around in a fog.  He feels dizzy all the time.  He he describes this as a off-balance sensation that is been going on for 6 months.  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   He has a past surgical history that includes Coronary stent placement (08/2006); Colonoscopy  (9/17/20012); Polypectomy (09/17/20012); and Tonsillectomy.   His family history includes Cancer in his mother; Healthy in his son; Heart attack in his father; Heart disease in his father; Hypertension in his mother and sister; Irritable bowel syndrome in his maternal grandmother.He reports that he quit smoking about 49 years ago. His smoking use included cigarettes. He smoked 1.00 pack per day. He quit smokeless tobacco use about 30 years ago.  His smokeless tobacco use included chew. He reports current alcohol use of about 2.0 standard drinks of alcohol per week. He reports that he does not use drugs.  Current Outpatient Medications on File Prior to Visit  Medication Sig Dispense Refill  . Blood Glucose Monitoring Suppl (ONETOUCH VERIO REFLECT) w/Device KIT See admin instructions.    . Blood Pressure Monitoring (CVS SERIES 100 BLOOD PRESSURE) DEVI USE AS DIRECTED TO CHECK BLOOD PRESSURE    . cyanocobalamin 1000 MCG tablet Take 1,000 mcg by mouth daily.    Marland Kitchen glipiZIDE (GLUCOTROL) 10 MG tablet Take 1 tablet (10 mg total) by mouth daily before breakfast. 90 tablet 1  . MAGNESIUM PO Take 420 mg by mouth.    . metoprolol succinate (TOPROL-XL) 50 MG 24 hr tablet TAKE 1 TABLET (50 MG TOTAL) BY MOUTH DAILY. TAKE WITH OR IMMEDIATELY FOLLOWING A MEAL. 90 tablet 1  . Multiple Vitamins-Minerals (CENTRUM SILVER PO) Take 1 capsule by mouth daily.      . nitroGLYCERIN (NITROSTAT) 0.4 MG SL tablet DISSOLVE 1 TABLET UNDER TONGUE AS NEEDED FOR CHEST PAIN 25 tablet prn  . OneTouch Delica Lancets 09U MISC TEST 3 TIME DAILY 100 each 1  . ONETOUCH VERIO test strip TEST BS TID R73.9 100 strip 10  No current facility-administered medications on file prior to visit.    ROS Review of Systems  Constitutional: Positive for activity change.  HENT: Negative.   Eyes: Negative for visual disturbance.  Respiratory: Negative for cough and shortness of breath.   Cardiovascular: Negative for chest pain and leg swelling.    Gastrointestinal: Negative for abdominal pain, diarrhea, nausea and vomiting.  Genitourinary: Negative for difficulty urinating.  Musculoskeletal: Negative for arthralgias and myalgias.  Skin: Negative for rash.  Neurological: Positive for dizziness (Described as feeling like he is in a fog and off balance ongoing for 6 months) and weakness (Symmetric in his legs). Negative for headaches.  Psychiatric/Behavioral: Negative for sleep disturbance.    Objective:  BP (!) 126/57   Pulse 63   Temp 98.1 F (36.7 C) (Temporal)   Resp 20   Ht $R'5\' 9"'Dq$  (1.753 m)   Wt 225 lb (102.1 kg)   SpO2 96%   BMI 33.23 kg/m   BP Readings from Last 3 Encounters:  06/09/20 (!) 126/57  03/09/20 111/62  12/09/19 138/68    Wt Readings from Last 3 Encounters:  06/09/20 225 lb (102.1 kg)  03/09/20 219 lb 3.2 oz (99.4 kg)  12/09/19 219 lb 4 oz (99.5 kg)     Physical Exam Constitutional:      General: He is not in acute distress.    Appearance: He is well-developed.  HENT:     Head: Normocephalic and atraumatic.     Right Ear: External ear normal.     Left Ear: External ear normal.     Nose: Nose normal.  Eyes:     Conjunctiva/sclera: Conjunctivae normal.     Pupils: Pupils are equal, round, and reactive to light.  Cardiovascular:     Rate and Rhythm: Normal rate and regular rhythm.     Heart sounds: Normal heart sounds. No murmur heard.   Pulmonary:     Effort: Pulmonary effort is normal. No respiratory distress.     Breath sounds: Normal breath sounds. No wheezing or rales.  Abdominal:     Palpations: Abdomen is soft.     Tenderness: There is no abdominal tenderness.  Musculoskeletal:        General: Normal range of motion.     Cervical back: Normal range of motion and neck supple.     Right lower leg: No edema.     Left lower leg: No edema.  Skin:    General: Skin is warm and dry.  Neurological:     Mental Status: He is alert and oriented to person, place, and time.     Deep Tendon  Reflexes: Reflexes are normal and symmetric.  Psychiatric:        Behavior: Behavior normal.        Thought Content: Thought content normal.        Judgment: Judgment normal.     Results for orders placed or performed in visit on 06/09/20  Bayer DCA Hb A1c Waived  Result Value Ref Range   HB A1C (BAYER DCA - WAIVED) 6.1 <7.0 %  CBC with Differential/Platelet  Result Value Ref Range   WBC 5.0 3.4 - 10.8 x10E3/uL   RBC 3.79 (L) 4.14 - 5.80 x10E6/uL   Hemoglobin 12.2 (L) 13.0 - 17.7 g/dL   Hematocrit 36.2 (L) 37.5 - 51.0 %   MCV 96 79 - 97 fL   MCH 32.2 26.6 - 33.0 pg   MCHC 33.7 31 - 35 g/dL   RDW 11.9 11.6 -  15.4 %   Platelets 74 (LL) 150 - 450 x10E3/uL   Neutrophils 66 Not Estab. %   Lymphs 23 Not Estab. %   Monocytes 8 Not Estab. %   Eos 2 Not Estab. %   Basos 1 Not Estab. %   Neutrophils Absolute 3.3 1.40 - 7.00 x10E3/uL   Lymphocytes Absolute 1.2 0 - 3 x10E3/uL   Monocytes Absolute 0.4 0 - 0 x10E3/uL   EOS (ABSOLUTE) 0.1 0.0 - 0.4 x10E3/uL   Basophils Absolute 0.1 0 - 0 x10E3/uL   Immature Granulocytes 0 Not Estab. %   Immature Grans (Abs) 0.0 0.0 - 0.1 x10E3/uL   Hematology Comments: Note:   CMP14+EGFR  Result Value Ref Range   Glucose 206 (H) 65 - 99 mg/dL   BUN 11 8 - 27 mg/dL   Creatinine, Ser 0.96 0.76 - 1.27 mg/dL   GFR calc non Af Amer 75 >59 mL/min/1.73   GFR calc Af Amer 87 >59 mL/min/1.73   BUN/Creatinine Ratio 11 10 - 24   Sodium 141 134 - 144 mmol/L   Potassium 4.4 3.5 - 5.2 mmol/L   Chloride 105 96 - 106 mmol/L   CO2 23 20 - 29 mmol/L   Calcium 9.2 8.6 - 10.2 mg/dL   Total Protein 6.0 6.0 - 8.5 g/dL   Albumin 3.7 3.7 - 4.7 g/dL   Globulin, Total 2.3 1.5 - 4.5 g/dL   Albumin/Globulin Ratio 1.6 1.2 - 2.2   Bilirubin Total 0.7 0.0 - 1.2 mg/dL   Alkaline Phosphatase 95 44 - 121 IU/L   AST 21 0 - 40 IU/L   ALT 20 0 - 44 IU/L  Lipid panel  Result Value Ref Range   Cholesterol, Total 94 (L) 100 - 199 mg/dL   Triglycerides 92 0 - 149 mg/dL   HDL 40  >39 mg/dL   VLDL Cholesterol Cal 18 5 - 40 mg/dL   LDL Chol Calc (NIH) 36 0 - 99 mg/dL   Chol/HDL Ratio 2.4 0.0 - 5.0 ratio       Assessment & Plan:   Qusai was seen today for medical management of chronic issues.  Diagnoses and all orders for this visit:  Essential hypertension -     Microalbumin / creatinine urine ratio -     Bayer DCA Hb A1c Waived -     CBC with Differential/Platelet -     CMP14+EGFR -     Lipid panel -     lisinopril (ZESTRIL) 40 MG tablet; Take 1 tablet (40 mg total) by mouth daily.  Type 2 diabetes mellitus with diabetic neuropathy, without long-term current use of insulin (HCC) -     Microalbumin / creatinine urine ratio -     Bayer DCA Hb A1c Waived -     CBC with Differential/Platelet -     CMP14+EGFR -     Lipid panel  Mixed hyperlipidemia -     Microalbumin / creatinine urine ratio -     Bayer DCA Hb A1c Waived -     CBC with Differential/Platelet -     CMP14+EGFR -     Lipid panel  Dizziness  Weakness of both legs  Other orders -     rivaroxaban (XARELTO) 20 MG TABS tablet; TAKE 1 TABLET (20 MG TOTAL) BY MOUTH DAILY WITH SUPPER. -     tamsulosin (FLOMAX) 0.4 MG CAPS capsule; Take 1 capsule (0.4 mg total) by mouth daily after supper. TAKE 1 CAPSULE BY MOUTH EVERYDAY AT BEDTIME -  atorvastatin (LIPITOR) 40 MG tablet; Take 1 tablet (40 mg total) by mouth daily. TAKE 1 TABLET 2 TIMES A WEEK   I have discontinued Arlen G. Hult's metFORMIN. I have changed his Xarelto to rivaroxaban. I have also changed his atorvastatin. Additionally, I am having him maintain his Multiple Vitamins-Minerals (CENTRUM SILVER PO), cyanocobalamin, OneTouch Verio Reflect, OneTouch Delica Lancets 35Q, nitroGLYCERIN, MAGNESIUM PO, CVS Series 100 Blood Pressure, OneTouch Verio, glipiZIDE, metoprolol succinate, tamsulosin, and lisinopril.  Meds ordered this encounter  Medications  . rivaroxaban (XARELTO) 20 MG TABS tablet    Sig: TAKE 1 TABLET (20 MG TOTAL) BY  MOUTH DAILY WITH SUPPER.    Dispense:  90 tablet    Refill:  1  . tamsulosin (FLOMAX) 0.4 MG CAPS capsule    Sig: Take 1 capsule (0.4 mg total) by mouth daily after supper. TAKE 1 CAPSULE BY MOUTH EVERYDAY AT BEDTIME    Dispense:  90 capsule    Refill:  1  . lisinopril (ZESTRIL) 40 MG tablet    Sig: Take 1 tablet (40 mg total) by mouth daily.    Dispense:  90 tablet    Refill:  1  . atorvastatin (LIPITOR) 40 MG tablet    Sig: Take 1 tablet (40 mg total) by mouth daily. TAKE 1 TABLET 2 TIMES A WEEK    Dispense:  90 tablet    Refill:  1   I recommended neurology evaluation.  Rather than a referral, patient will pursue this within the [place] system.  Follow-up: Return in about 6 months (around 12/07/2020), or if symptoms worsen or fail to improve.  Claretta Fraise, M.D.

## 2020-06-28 ENCOUNTER — Other Ambulatory Visit: Payer: Self-pay | Admitting: *Deleted

## 2020-06-28 DIAGNOSIS — R69 Illness, unspecified: Secondary | ICD-10-CM | POA: Diagnosis not present

## 2020-06-28 MED ORDER — ONETOUCH DELICA LANCETS 33G MISC
3 refills | Status: DC
Start: 2020-06-28 — End: 2022-03-05

## 2020-07-11 DIAGNOSIS — R69 Illness, unspecified: Secondary | ICD-10-CM | POA: Diagnosis not present

## 2020-09-13 ENCOUNTER — Encounter: Payer: Self-pay | Admitting: Family Medicine

## 2020-09-13 ENCOUNTER — Ambulatory Visit (INDEPENDENT_AMBULATORY_CARE_PROVIDER_SITE_OTHER): Payer: Medicare HMO | Admitting: Family Medicine

## 2020-09-13 ENCOUNTER — Other Ambulatory Visit: Payer: Self-pay

## 2020-09-13 VITALS — BP 123/68 | HR 58 | Temp 98.6°F | Resp 20 | Ht 69.0 in | Wt 223.1 lb

## 2020-09-13 DIAGNOSIS — E119 Type 2 diabetes mellitus without complications: Secondary | ICD-10-CM

## 2020-09-13 DIAGNOSIS — E782 Mixed hyperlipidemia: Secondary | ICD-10-CM | POA: Diagnosis not present

## 2020-09-13 DIAGNOSIS — E785 Hyperlipidemia, unspecified: Secondary | ICD-10-CM | POA: Diagnosis not present

## 2020-09-13 DIAGNOSIS — E1140 Type 2 diabetes mellitus with diabetic neuropathy, unspecified: Secondary | ICD-10-CM

## 2020-09-13 DIAGNOSIS — I10 Essential (primary) hypertension: Secondary | ICD-10-CM

## 2020-09-13 LAB — BAYER DCA HB A1C WAIVED: HB A1C (BAYER DCA - WAIVED): 6.1 % (ref <7.0)

## 2020-09-13 MED ORDER — METOPROLOL SUCCINATE ER 50 MG PO TB24: 50.0000 mg | ORAL_TABLET | Freq: Every day | ORAL | 1 refills | Status: DC

## 2020-09-13 MED ORDER — GLIPIZIDE 10 MG PO TABS: 10.0000 mg | ORAL_TABLET | Freq: Every day | ORAL | 1 refills | Status: DC

## 2020-09-13 NOTE — Progress Notes (Signed)
Subjective:  Patient ID: [name],  male    DOB: 1940-10-26  Age: 80 y.o.    CC: Medical Management of Chronic Issues   HPI AARION METZGAR presents for  follow-up of hypertension. Patient has no history of headache chest pain or shortness of breath or recent cough. Patient also denies symptoms of TIA such as numbness weakness lateralizing. Patient denies side effects from medication. States taking it regularly.  Patient also  in for follow-up of elevated cholesterol. Doing well without complaints on current medication. Denies side effects  including myalgia and arthralgia and nausea. Also in today for liver function testing. Currently no chest pain, shortness of breath or other cardiovascular related symptoms noted.  Follow-up of diabetes. Patient does check blood sugar at home. Readings run between 110 and 160 Patient denies symptoms such as excessive hunger or urinary frequency, excessive hunger, nausea No significant hypoglycemic spells noted. Medications reviewed. Pt reports taking them regularly. Pt. denies complication/adverse reaction today.    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   He has a past surgical history that includes Coronary stent placement (08/2006); Colonoscopy (9/17/20012); Polypectomy (09/17/20012); and Tonsillectomy.   His family history includes Cancer in his mother; Healthy in his son; Heart attack in his father; Heart disease in his father; Hypertension in his mother and sister; Irritable bowel syndrome in his maternal grandmother.He reports that he quit smoking about  49 years ago. His smoking use included cigarettes. He smoked 1.00 pack per day. He quit smokeless tobacco use about 31 years ago.  His smokeless tobacco use included chew. He reports current alcohol use of about 2.0 standard drinks of alcohol per week. He reports that he does not use drugs.  Current Outpatient Medications on File Prior to Visit  Medication Sig Dispense Refill  . atorvastatin (LIPITOR) 40 MG tablet Take 1 tablet (40 mg total) by mouth daily. TAKE 1 TABLET 2 TIMES A WEEK 90 tablet 1  . Blood Glucose Monitoring Suppl (ONETOUCH VERIO REFLECT) w/Device KIT See admin instructions.    . cyanocobalamin 1000 MCG tablet Take 1,000 mcg by mouth daily.    Marland Kitchen lisinopril (ZESTRIL) 40 MG tablet Take 1 tablet (40 mg total) by mouth daily. 90 tablet 1  . MAGNESIUM PO Take 420 mg by mouth.    . Multiple Vitamins-Minerals (CENTRUM SILVER PO) Take 1 capsule by mouth daily.    . nitroGLYCERIN (NITROSTAT) 0.4 MG SL tablet DISSOLVE 1 TABLET UNDER TONGUE AS NEEDED FOR CHEST PAIN 25 tablet prn  . OneTouch Delica Lancets 15V MISC TEST 3 TIME DAILY Dx E11.40 300 each 3  . ONETOUCH VERIO test strip TEST BS TID R73.9 100 strip 10  . rivaroxaban (XARELTO) 20 MG TABS tablet TAKE 1 TABLET (20 MG TOTAL) BY MOUTH DAILY WITH SUPPER. 90 tablet 1  . tamsulosin (FLOMAX) 0.4 MG CAPS capsule Take 1 capsule (0.4 mg total) by mouth daily after supper. TAKE 1 CAPSULE BY MOUTH EVERYDAY AT BEDTIME 90 capsule 1   No current facility-administered medications on file prior to visit.    ROS Review of Systems  Constitutional: Negative for fever.  Respiratory: Negative for shortness of breath.   Cardiovascular: Negative for chest pain.  Musculoskeletal: Negative for arthralgias.  Skin: Negative for rash.  Neurological: Positive for numbness (fingers- from neuropathy).    Objective:  BP 123/68   Pulse (!) 58   Temp 98.6 F (37 C) (Temporal)   Resp 20   Ht $R'5\' 9"'ln$  (1.753 m)   Wt 223 lb 2 oz (101.2 kg)   SpO2 98%    BMI 32.95 kg/m   BP Readings from Last 3 Encounters:  09/13/20 123/68  06/09/20 (!) 126/57  03/09/20 111/62    Wt Readings from Last 3 Encounters:  09/13/20 223 lb 2 oz (101.2 kg)  06/09/20 225 lb (102.1 kg)  03/09/20 219 lb 3.2 oz (99.4 kg)     Physical Exam Vitals reviewed.  Constitutional:      Appearance: He is well-developed and well-nourished.  HENT:     Head: Normocephalic and atraumatic.     Right Ear: Tympanic membrane and external ear normal. No decreased hearing noted.     Left Ear: Tympanic membrane and external ear normal. No decreased hearing noted.     Mouth/Throat:     Pharynx: No oropharyngeal exudate or posterior oropharyngeal erythema.  Eyes:     Pupils: Pupils are equal, round, and reactive to light.  Cardiovascular:     Rate and Rhythm: Normal rate and regular rhythm.     Heart sounds: No murmur heard.   Pulmonary:     Effort: No respiratory distress.     Breath sounds: Normal breath sounds.  Abdominal:     General: Bowel sounds are normal.     Palpations: Abdomen is soft. There is no mass.     Tenderness: There is no abdominal tenderness.  Musculoskeletal:     Cervical back: Normal range of motion and neck supple.     Diabetic Foot Exam - Simple   No data filed       Assessment & Plan:   Breland was seen today for medical management of chronic issues.  Diagnoses and all orders for this visit:  Essential hypertension -     Bayer DCA Hb A1c Waived -     Microalbumin / creatinine urine ratio -     CBC with Differential/Platelet -     CMP14+EGFR -     Lipid panel  Type 2 diabetes mellitus with diabetic neuropathy, without long-term current use of insulin (HCC) -     Bayer DCA Hb A1c Waived -     Microalbumin / creatinine urine ratio -     CBC with Differential/Platelet -     CMP14+EGFR -     Lipid panel  Mixed hyperlipidemia -     Bayer DCA Hb A1c Waived -     Microalbumin / creatinine urine ratio -     CBC with  Differential/Platelet -     CMP14+EGFR -     Lipid panel  Diabetes mellitus without complication (HCC) -     glipiZIDE (GLUCOTROL) 10 MG tablet; Take 1 tablet (10 mg total) by mouth daily before breakfast.  Dyslipidemia  Other orders -     metoprolol succinate (TOPROL-XL) 50 MG 24 hr tablet; Take 1 tablet (50 mg total) by mouth daily. Take with or immediately following a meal.   I have discontinued Jermey G. Wawrzyniak's [facility] Series 100 Blood Pressure. I am also having him maintain his Multiple Vitamins-Minerals (CENTRUM SILVER PO), cyanocobalamin, OneTouch Verio Reflect, nitroGLYCERIN, MAGNESIUM PO, OneTouch Verio, rivaroxaban, tamsulosin, lisinopril, atorvastatin, OneTouch Delica Lancets 81W, metoprolol succinate, and glipiZIDE.  Meds ordered this encounter  Medications  .  metoprolol succinate (TOPROL-XL) 50 MG 24 hr tablet    Sig: Take 1 tablet (50 mg total) by mouth daily. Take with or immediately following a meal.    Dispense:  90 tablet    Refill:  1  . glipiZIDE (GLUCOTROL) 10 MG tablet    Sig: Take 1 tablet (10 mg total) by mouth daily before breakfast.    Dispense:  90 tablet    Refill:  1     Follow-up: Return in about 3 months (around 12/11/2020).  Claretta Fraise, M.D.

## 2020-09-14 LAB — CMP14+EGFR
ALT: 18 IU/L (ref 0–44)
AST: 24 IU/L (ref 0–40)
Albumin/Globulin Ratio: 1.6 (ref 1.2–2.2)
Albumin: 4 g/dL (ref 3.7–4.7)
Alkaline Phosphatase: 118 IU/L (ref 44–121)
BUN/Creatinine Ratio: 13 (ref 10–24)
BUN: 13 mg/dL (ref 8–27)
Bilirubin Total: 0.8 mg/dL (ref 0.0–1.2)
CO2: 24 mmol/L (ref 20–29)
Calcium: 9.2 mg/dL (ref 8.6–10.2)
Chloride: 105 mmol/L (ref 96–106)
Creatinine, Ser: 1 mg/dL (ref 0.76–1.27)
GFR calc Af Amer: 83 mL/min/{1.73_m2} (ref >59)
GFR calc non Af Amer: 72 mL/min/{1.73_m2} (ref >59)
Globulin, Total: 2.5 g/dL (ref 1.5–4.5)
Glucose: 139 mg/dL — ABNORMAL HIGH (ref 65–99)
Potassium: 4.8 mmol/L (ref 3.5–5.2)
Sodium: 141 mmol/L (ref 134–144)
Total Protein: 6.5 g/dL (ref 6.0–8.5)

## 2020-09-14 LAB — CBC WITH DIFFERENTIAL/PLATELET
Basophils Absolute: 0 10*3/uL (ref 0.0–0.2)
Basos: 1 %
EOS (ABSOLUTE): 0.1 10*3/uL (ref 0.0–0.4)
Eos: 2 %
Hematocrit: 34.3 % — ABNORMAL LOW (ref 37.5–51.0)
Hemoglobin: 11.7 g/dL — ABNORMAL LOW (ref 13.0–17.7)
Immature Grans (Abs): 0 10*3/uL (ref 0.0–0.1)
Immature Granulocytes: 0 %
Lymphocytes Absolute: 1.1 10*3/uL (ref 0.7–3.1)
Lymphs: 25 %
MCH: 31.4 pg (ref 26.6–33.0)
MCHC: 34.1 g/dL (ref 31.5–35.7)
MCV: 92 fL (ref 79–97)
Monocytes Absolute: 0.5 10*3/uL (ref 0.1–0.9)
Monocytes: 11 %
Neutrophils Absolute: 2.7 10*3/uL (ref 1.4–7.0)
Neutrophils: 61 %
Platelets: 87 10*3/uL — CL (ref 150–450)
RBC: 3.73 x10E6/uL — ABNORMAL LOW (ref 4.14–5.80)
RDW: 12.8 % (ref 11.6–15.4)
WBC: 4.5 10*3/uL (ref 3.4–10.8)

## 2020-09-14 LAB — LIPID PANEL
Chol/HDL Ratio: 2.5 ratio (ref 0.0–5.0)
Cholesterol, Total: 82 mg/dL — ABNORMAL LOW (ref 100–199)
HDL: 33 mg/dL — ABNORMAL LOW (ref >39)
LDL Chol Calc (NIH): 36 mg/dL (ref 0–99)
Triglycerides: 49 mg/dL (ref 0–149)
VLDL Cholesterol Cal: 13 mg/dL (ref 5–40)

## 2020-09-14 LAB — MICROALBUMIN / CREATININE URINE RATIO
Creatinine, Urine: 42.2 mg/dL
Microalb/Creat Ratio: <7 mg/g creat (ref 0–29)
Microalbumin, Urine: <3 ug/mL

## 2020-11-04 DIAGNOSIS — E1142 Type 2 diabetes mellitus with diabetic polyneuropathy: Secondary | ICD-10-CM | POA: Diagnosis not present

## 2020-11-04 DIAGNOSIS — E1165 Type 2 diabetes mellitus with hyperglycemia: Secondary | ICD-10-CM | POA: Diagnosis not present

## 2020-11-04 DIAGNOSIS — E1151 Type 2 diabetes mellitus with diabetic peripheral angiopathy without gangrene: Secondary | ICD-10-CM | POA: Diagnosis not present

## 2020-11-04 DIAGNOSIS — I2510 Atherosclerotic heart disease of native coronary artery without angina pectoris: Secondary | ICD-10-CM | POA: Diagnosis not present

## 2020-11-04 DIAGNOSIS — N400 Enlarged prostate without lower urinary tract symptoms: Secondary | ICD-10-CM | POA: Diagnosis not present

## 2020-11-04 DIAGNOSIS — E669 Obesity, unspecified: Secondary | ICD-10-CM | POA: Diagnosis not present

## 2020-11-04 DIAGNOSIS — G8929 Other chronic pain: Secondary | ICD-10-CM | POA: Diagnosis not present

## 2020-11-04 DIAGNOSIS — E785 Hyperlipidemia, unspecified: Secondary | ICD-10-CM | POA: Diagnosis not present

## 2020-11-04 DIAGNOSIS — E538 Deficiency of other specified B group vitamins: Secondary | ICD-10-CM | POA: Diagnosis not present

## 2020-11-04 DIAGNOSIS — I10 Essential (primary) hypertension: Secondary | ICD-10-CM | POA: Diagnosis not present

## 2020-11-09 ENCOUNTER — Ambulatory Visit: Payer: Medicare HMO | Admitting: Neurology

## 2020-12-08 ENCOUNTER — Other Ambulatory Visit: Payer: Self-pay

## 2020-12-08 ENCOUNTER — Ambulatory Visit (INDEPENDENT_AMBULATORY_CARE_PROVIDER_SITE_OTHER): Payer: Medicare HMO | Admitting: Family Medicine

## 2020-12-08 ENCOUNTER — Encounter: Payer: Self-pay | Admitting: Family Medicine

## 2020-12-08 VITALS — BP 117/69 | HR 89 | Temp 98.0°F | Ht 69.0 in | Wt 226.8 lb

## 2020-12-08 DIAGNOSIS — N401 Enlarged prostate with lower urinary tract symptoms: Secondary | ICD-10-CM

## 2020-12-08 DIAGNOSIS — G629 Polyneuropathy, unspecified: Secondary | ICD-10-CM

## 2020-12-08 DIAGNOSIS — I2510 Atherosclerotic heart disease of native coronary artery without angina pectoris: Secondary | ICD-10-CM

## 2020-12-08 DIAGNOSIS — K760 Fatty (change of) liver, not elsewhere classified: Secondary | ICD-10-CM | POA: Diagnosis not present

## 2020-12-08 DIAGNOSIS — R350 Frequency of micturition: Secondary | ICD-10-CM

## 2020-12-08 DIAGNOSIS — I10 Essential (primary) hypertension: Secondary | ICD-10-CM | POA: Diagnosis not present

## 2020-12-08 DIAGNOSIS — E1140 Type 2 diabetes mellitus with diabetic neuropathy, unspecified: Secondary | ICD-10-CM

## 2020-12-08 DIAGNOSIS — G3281 Cerebellar ataxia in diseases classified elsewhere: Secondary | ICD-10-CM

## 2020-12-08 DIAGNOSIS — I4891 Unspecified atrial fibrillation: Secondary | ICD-10-CM

## 2020-12-08 DIAGNOSIS — E782 Mixed hyperlipidemia: Secondary | ICD-10-CM | POA: Diagnosis not present

## 2020-12-08 LAB — BAYER DCA HB A1C WAIVED: HB A1C (BAYER DCA - WAIVED): 6.4 % (ref <7.0)

## 2020-12-08 MED ORDER — LISINOPRIL 40 MG PO TABS: 40.0000 mg | ORAL_TABLET | Freq: Every day | ORAL | 1 refills | Status: DC

## 2020-12-08 MED ORDER — NITROGLYCERIN 0.4 MG SL SUBL: SUBLINGUAL_TABLET | SUBLINGUAL | 99 refills | Status: AC

## 2020-12-08 MED ORDER — ATORVASTATIN CALCIUM 40 MG PO TABS: 40.0000 mg | ORAL_TABLET | Freq: Every day | ORAL | 1 refills | Status: DC

## 2020-12-08 MED ORDER — TAMSULOSIN HCL 0.4 MG PO CAPS: 0.8000 mg | ORAL_CAPSULE | Freq: Every day | ORAL | 1 refills | Status: DC

## 2020-12-08 MED ORDER — RIVAROXABAN 20 MG PO TABS: ORAL_TABLET | ORAL | 1 refills | Status: DC

## 2020-12-08 NOTE — Progress Notes (Signed)
Subjective:  Patient ID: [name],  male    DOB: 1940-08-03  Age: 80 y.o.    CC: Medical Management of Chronic Issues   HPI TYRIS ELIOT presents for  follow-up of hypertension. Patient has no history of headache chest pain or shortness of breath or recent cough. Patient also denies symptoms of TIA such as numbness weakness lateralizing. Patient denies side effects from medication. States taking it regularly.  Patient also  in for follow-up of elevated cholesterol. Doing well without complaints on current medication. Denies side effects  including myalgia and arthralgia and nausea. Also in today for liver function testing. Currently no chest pain, shortness of breath or other cardiovascular related symptoms noted.  Follow-up of diabetes. Patient does check blood sugar at home. Readings run between 100 and 160 Patient denies symptoms such as excessive hunger or urinary frequency, excessive hunger, nausea No significant hypoglycemic spells noted. Medications reviewed. Pt reports taking them regularly. Pt. denies complication/adverse reaction today.    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   He has a past surgical history that includes Coronary stent placement (08/2006); Colonoscopy (9/17/20012); Polypectomy (09/17/20012); and Tonsillectomy.   His family history includes Cancer in his mother; Healthy in his son; Heart attack in his father; Heart disease in his father; Hypertension in his mother and sister; Irritable bowel syndrome in his maternal grandmother.He reports that he  quit smoking about 49 years ago. His smoking use included cigarettes. He smoked 1.00 pack per day. He quit smokeless tobacco use about 31 years ago.  His smokeless tobacco use included chew. He reports current alcohol use of about 2.0 standard drinks of alcohol per week. He reports that he does not use drugs.  Current Outpatient Medications on File Prior to Visit  Medication Sig Dispense Refill  . Blood Glucose Monitoring Suppl (ONETOUCH VERIO REFLECT) w/Device KIT See admin instructions.    . cyanocobalamin 1000 MCG tablet Take 1,000 mcg by mouth daily.    Marland Kitchen glipiZIDE (GLUCOTROL) 10 MG tablet Take 1 tablet (10 mg total) by mouth daily before breakfast. 90 tablet 1  . MAGNESIUM PO Take 420 mg by mouth.    . metoprolol succinate (TOPROL-XL) 50 MG 24 hr tablet Take 1 tablet (50 mg total) by mouth daily. Take with or immediately following a meal. 90 tablet 1  . Multiple Vitamins-Minerals (CENTRUM SILVER PO) Take 1 capsule by mouth daily.    [name] Delica Lancets 21J MISC TEST 3 TIME DAILY Dx E11.40 300 each 3  . ONETOUCH VERIO test strip TEST BS TID R73.9 100 strip 10   No current facility-administered medications on file prior to visit.    ROS Review of Systems  Constitutional: Negative for fever.  Respiratory: Negative for shortness of breath.   Cardiovascular: Negative for chest pain.  Musculoskeletal: Negative for arthralgias.  Skin: Negative for rash.    Objective:  BP 117/69   Pulse 89   Temp 98 F (36.7 C)   Ht 5' 9" (1.753 m)   Wt 226 lb 12.8 oz (102.9 kg)   SpO2 100%   BMI 33.49 kg/m   BP Readings from Last 3 Encounters:  12/08/20 117/69  09/13/20 123/68  06/09/20 (!) 126/57  Wt Readings from Last 3 Encounters:  12/08/20 226 lb 12.8 oz (102.9 kg)  09/13/20 223 lb 2 oz (101.2 kg)  06/09/20 225 lb (102.1 kg)     Physical Exam Vitals reviewed.  Constitutional:      Appearance: He is well-developed.  HENT:     Head: Normocephalic and atraumatic.      Right Ear: External ear normal.     Left Ear: External ear normal.     Mouth/Throat:     Pharynx: No oropharyngeal exudate or posterior oropharyngeal erythema.  Eyes:     Pupils: Pupils are equal, round, and reactive to light.  Cardiovascular:     Rate and Rhythm: Normal rate and regular rhythm.     Heart sounds: No murmur heard.   Pulmonary:     Effort: No respiratory distress.     Breath sounds: Normal breath sounds.  Musculoskeletal:     Cervical back: Normal range of motion and neck supple.  Neurological:     Mental Status: He is alert and oriented to person, place, and time.     Diabetic Foot Exam - Simple   Simple Foot Form Diabetic Foot exam was performed with the following findings: Yes 12/08/2020  8:14 AM  Visual Inspection No deformities, no ulcerations, no other skin breakdown bilaterally: Yes Sensation Testing Intact to touch and monofilament testing bilaterally: Yes Pulse Check Posterior Tibialis and Dorsalis pulse intact bilaterally: Yes Comments       Assessment & Plan:   Dareion was seen today for medical management of chronic issues.  Diagnoses and all orders for this visit:  Essential hypertension -     lisinopril (ZESTRIL) 40 MG tablet; Take 1 tablet (40 mg total) by mouth daily.  Type 2 diabetes mellitus with diabetic neuropathy, without long-term current use of insulin (HCC) -     Bayer DCA Hb A1c Waived -     CMP14+EGFR -     CBC with Differential/Platelet  Mixed hyperlipidemia -     Lipid panel -     atorvastatin (LIPITOR) 40 MG tablet; Take 1 tablet (40 mg total) by mouth daily. TAKE 1 TABLET 2 TIMES A WEEK  Atrial fibrillation, unspecified type (HCC) -     rivaroxaban (XARELTO) 20 MG TABS tablet; TAKE 1 TABLET (20 MG TOTAL) BY MOUTH DAILY WITH SUPPER.  Cerebellar ataxia in diseases classified elsewhere [facility])  Coronary artery disease involving native coronary artery of native heart without angina pectoris -     nitroGLYCERIN (NITROSTAT)  0.4 MG SL tablet; DISSOLVE 1 TABLET UNDER TONGUE AS NEEDED FOR CHEST PAIN  Fatty liver disease, nonalcoholic  Polyneuropathy  Benign prostatic hyperplasia with urinary frequency -     tamsulosin (FLOMAX) 0.4 MG CAPS capsule; Take 2 capsules (0.8 mg total) by mouth daily after supper. TAKE 1 CAPSULE BY MOUTH EVERYDAY AT BEDTIME   I have changed Camara G. Barnett's tamsulosin. I am also having him maintain his Multiple Vitamins-Minerals (CENTRUM SILVER PO), cyanocobalamin, OneTouch Verio Reflect, MAGNESIUM PO, OneTouch Verio, OneTouch Delica Lancets 40G, metoprolol succinate, glipiZIDE, atorvastatin, lisinopril, nitroGLYCERIN, and rivaroxaban.  Meds ordered this encounter  Medications  . atorvastatin (LIPITOR) 40 MG tablet    Sig: Take 1 tablet (40 mg total) by mouth daily. TAKE 1 TABLET 2 TIMES A WEEK    Dispense:  90 tablet    Refill:  1  . lisinopril (ZESTRIL) 40 MG tablet    Sig: Take 1 tablet (40 mg total) by mouth daily.    Dispense:  90 tablet    Refill:  1  . nitroGLYCERIN (NITROSTAT) 0.4 MG SL tablet    Sig: DISSOLVE 1 TABLET UNDER TONGUE AS NEEDED FOR CHEST PAIN    Dispense:  25 tablet    Refill:  prn  . rivaroxaban (XARELTO) 20 MG TABS tablet    Sig: TAKE 1 TABLET (20 MG TOTAL) BY MOUTH DAILY WITH SUPPER.    Dispense:  90 tablet    Refill:  1  . tamsulosin (FLOMAX) 0.4 MG CAPS capsule    Sig: Take 2 capsules (0.8 mg total) by mouth daily after supper. TAKE 1 CAPSULE BY MOUTH EVERYDAY AT BEDTIME    Dispense:  180 capsule    Refill:  1     Follow-up: Return in about 3 months (around 03/10/2021).  Claretta Fraise, M.D.

## 2020-12-08 NOTE — Patient Instructions (Signed)
A1c today is 6.4

## 2020-12-09 LAB — CBC WITH DIFFERENTIAL/PLATELET
Basophils Absolute: 0 10*3/uL (ref 0.0–0.2)
Basos: 1 %
EOS (ABSOLUTE): 0.1 10*3/uL (ref 0.0–0.4)
Eos: 1 %
Hematocrit: 31.9 % — ABNORMAL LOW (ref 37.5–51.0)
Hemoglobin: 10.8 g/dL — ABNORMAL LOW (ref 13.0–17.7)
Immature Grans (Abs): 0 10*3/uL (ref 0.0–0.1)
Immature Granulocytes: 0 %
Lymphocytes Absolute: 1 10*3/uL (ref 0.7–3.1)
Lymphs: 29 %
MCH: 29.3 pg (ref 26.6–33.0)
MCHC: 33.9 g/dL (ref 31.5–35.7)
MCV: 87 fL (ref 79–97)
Monocytes Absolute: 0.3 10*3/uL (ref 0.1–0.9)
Monocytes: 9 %
NRBC: 1 % — ABNORMAL HIGH (ref 0–0)
Neutrophils Absolute: 2.1 10*3/uL (ref 1.4–7.0)
Neutrophils: 60 %
Platelets: 70 10*3/uL — CL (ref 150–450)
RBC: 3.68 x10E6/uL — ABNORMAL LOW (ref 4.14–5.80)
RDW: 13.1 % (ref 11.6–15.4)
WBC: 3.5 10*3/uL (ref 3.4–10.8)

## 2020-12-09 LAB — CMP14+EGFR
ALT: 16 IU/L (ref 0–44)
AST: 20 IU/L (ref 0–40)
Albumin/Globulin Ratio: 1.8 (ref 1.2–2.2)
Albumin: 4.3 g/dL (ref 3.7–4.7)
Alkaline Phosphatase: 80 IU/L (ref 44–121)
BUN/Creatinine Ratio: 11 (ref 10–24)
BUN: 11 mg/dL (ref 8–27)
Bilirubin Total: 0.6 mg/dL (ref 0.0–1.2)
CO2: 22 mmol/L (ref 20–29)
Calcium: 9.8 mg/dL (ref 8.6–10.2)
Chloride: 104 mmol/L (ref 96–106)
Creatinine, Ser: 1.04 mg/dL (ref 0.76–1.27)
Globulin, Total: 2.4 g/dL (ref 1.5–4.5)
Glucose: 118 mg/dL — ABNORMAL HIGH (ref 65–99)
Potassium: 4.5 mmol/L (ref 3.5–5.2)
Sodium: 140 mmol/L (ref 134–144)
Total Protein: 6.7 g/dL (ref 6.0–8.5)
eGFR: 73 mL/min/{1.73_m2} (ref >59)

## 2020-12-09 LAB — LIPID PANEL
Chol/HDL Ratio: 2.3 ratio (ref 0.0–5.0)
Cholesterol, Total: 93 mg/dL — ABNORMAL LOW (ref 100–199)
HDL: 40 mg/dL (ref >39)
LDL Chol Calc (NIH): 41 mg/dL (ref 0–99)
Triglycerides: 46 mg/dL (ref 0–149)
VLDL Cholesterol Cal: 12 mg/dL (ref 5–40)

## 2020-12-12 NOTE — Progress Notes (Signed)
Hello Alick,  Your lab result is normal and/or stable.Some minor variations that are not significant are commonly marked abnormal, but do not represent any medical problem for you.  Best regards, Jamieson Hetland, M.D.

## 2020-12-27 ENCOUNTER — Telehealth: Payer: Self-pay | Admitting: Family Medicine

## 2020-12-27 NOTE — Telephone Encounter (Signed)
Line busy

## 2020-12-29 NOTE — Telephone Encounter (Signed)
TC yesterday to pt, form has not been received, is not on Dr. Livia Snellen desk, main fax is down. TC to [employer] this morning, they will fax another to fax # [phone]

## 2021-01-05 DIAGNOSIS — G4733 Obstructive sleep apnea (adult) (pediatric): Secondary | ICD-10-CM | POA: Diagnosis not present

## 2021-02-17 ENCOUNTER — Telehealth: Payer: Self-pay | Admitting: Family Medicine

## 2021-02-17 DIAGNOSIS — I10 Essential (primary) hypertension: Secondary | ICD-10-CM

## 2021-02-17 NOTE — Telephone Encounter (Signed)
Patient would like for [place] to send [facility] for a blood pressure monitor.  I'm not sure if insurance will pay for this, but we can try.

## 2021-02-17 NOTE — Telephone Encounter (Signed)
Pt called requesting that Rx for BP monitor be [facility].  Please advise and call patient to let him know if Rx was sent or not.

## 2021-02-17 NOTE — Telephone Encounter (Signed)
Patient aware that script sent to pharmacy for  BP monitor.

## 2021-02-17 NOTE — Telephone Encounter (Signed)
Ordered

## 2021-02-20 ENCOUNTER — Telehealth: Payer: Self-pay | Admitting: Family Medicine

## 2021-02-20 NOTE — Telephone Encounter (Signed)
LMOVM order for BP monitor faxed to [facility]

## 2021-03-06 ENCOUNTER — Other Ambulatory Visit: Payer: Self-pay | Admitting: Family Medicine

## 2021-03-06 NOTE — Telephone Encounter (Signed)
  Prescription Request  03/06/2021  What is the name of the medication or equipment? RX for monitor-Blood Pressure  Have you contacted your pharmacy to request a refill? (if applicable) yes  [facility]   Patient notified that their request is being sent to the clinical staff for review and that they should receive a response within 2 business days.    Stacks' pt.  Send RX to [place], [facility] usually [facility].  Please call ASAP.  He has tried to get RX for several weeks ago.

## 2021-03-07 ENCOUNTER — Telehealth: Payer: Self-pay | Admitting: Family Medicine

## 2021-03-07 ENCOUNTER — Other Ambulatory Visit: Payer: Self-pay | Admitting: Family Medicine

## 2021-03-07 MED ORDER — CVS BLOOD PRESSURE MONITOR KIT: PACK | 0 refills | Status: DC

## 2021-03-07 NOTE — Telephone Encounter (Signed)
[facility] that [facility] no longer did blood pressure monitors for his insurance He called his insurance back & he has to go through a DME supplier, either [facility] or [employer] Will fax order for BP monitor to [place]

## 2021-03-13 ENCOUNTER — Ambulatory Visit (INDEPENDENT_AMBULATORY_CARE_PROVIDER_SITE_OTHER): Payer: Medicare HMO | Admitting: Family Medicine

## 2021-03-13 ENCOUNTER — Other Ambulatory Visit: Payer: Self-pay

## 2021-03-13 ENCOUNTER — Encounter: Payer: Self-pay | Admitting: Family Medicine

## 2021-03-13 ENCOUNTER — Ambulatory Visit: Payer: Medicare HMO | Admitting: Family Medicine

## 2021-03-13 VITALS — BP 160/65 | HR 62 | Temp 98.0°F | Ht 69.0 in | Wt 233.0 lb

## 2021-03-13 DIAGNOSIS — E1140 Type 2 diabetes mellitus with diabetic neuropathy, unspecified: Secondary | ICD-10-CM

## 2021-03-13 DIAGNOSIS — E119 Type 2 diabetes mellitus without complications: Secondary | ICD-10-CM

## 2021-03-13 DIAGNOSIS — D649 Anemia, unspecified: Secondary | ICD-10-CM | POA: Diagnosis not present

## 2021-03-13 DIAGNOSIS — R42 Dizziness and giddiness: Secondary | ICD-10-CM | POA: Insufficient documentation

## 2021-03-13 DIAGNOSIS — I10 Essential (primary) hypertension: Secondary | ICD-10-CM

## 2021-03-13 DIAGNOSIS — E782 Mixed hyperlipidemia: Secondary | ICD-10-CM

## 2021-03-13 LAB — BAYER DCA HB A1C WAIVED: HB A1C (BAYER DCA - WAIVED): 6.3 % (ref <7.0)

## 2021-03-13 MED ORDER — METOPROLOL SUCCINATE ER 50 MG PO TB24: 50.0000 mg | ORAL_TABLET | Freq: Every day | ORAL | 3 refills | Status: DC

## 2021-03-13 MED ORDER — GLIPIZIDE 10 MG PO TABS: 10.0000 mg | ORAL_TABLET | Freq: Every day | ORAL | 3 refills | Status: DC

## 2021-03-13 NOTE — Progress Notes (Signed)
Subjective:  Patient ID: [name],  male    DOB: [birth year]  Age: 80 y.o.    CC: Medical Management of Chronic Issues   HPI DOW BLAHNIK presents for  follow-up of hypertension. Patient has no history of headache chest pain or shortness of breath or recent cough. Patient also denies symptoms of TIA such as numbness weakness lateralizing. Patient denies side effects from medication. States taking it regularly.  Patient also  in for follow-up of elevated cholesterol. Doing well without complaints on current medication. Denies side effects  including myalgia and arthralgia and nausea. Also in today for liver function testing. Currently no chest pain, shortness of breath or other cardiovascular related symptoms noted.  Follow-up of diabetes. Patient does check blood sugar at home. Readings run between 120 and 140 fasting 150-180 Patient denies symptoms such as excessive hunger or urinary frequency, excessive hunger, nausea No significant hypoglycemic spells noted. Medications reviewed. Pt reports taking them regularly. Pt. denies complication/adverse reaction today.    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   He has a past surgical history that includes Coronary stent placement (08/2006); Colonoscopy (9/17/20012); Polypectomy (09/17/20012); and Tonsillectomy.   His family history includes Cancer in his mother; Healthy in his son; Heart attack in his father; Heart disease in his father; Hypertension in his mother and sister; Irritable bowel syndrome in his maternal grandmother.He  reports that he quit smoking about 50 years ago. His smoking use included cigarettes. He smoked an average of 1 pack per day. He quit smokeless tobacco use about 31 years ago.  His smokeless tobacco use included chew. He reports current alcohol use of about 2.0 standard drinks per week. He reports that he does not use drugs.  Current Outpatient Medications on File Prior to Visit  Medication Sig Dispense Refill   atorvastatin (LIPITOR) 40 MG tablet Take 1 tablet (40 mg total) by mouth daily. TAKE 1 TABLET 2 TIMES A WEEK 90 tablet 1   Blood Glucose Monitoring Suppl (ONETOUCH VERIO REFLECT) w/Device KIT See admin instructions.     Blood Pressure Monitoring ([facility] BLOOD PRESSURE MONITOR) KIT DX I10 Monitor BP daily & as needed 1 kit 0   cyanocobalamin 1000 MCG tablet Take 1,000 mcg by mouth daily.     lisinopril (ZESTRIL) 40 MG tablet Take 1 tablet (40 mg total) by mouth daily. 90 tablet 1   MAGNESIUM PO Take 420 mg by mouth.     Multiple Vitamins-Minerals (CENTRUM SILVER PO) Take 1 capsule by mouth daily.     nitroGLYCERIN (NITROSTAT) 0.4 MG SL tablet DISSOLVE 1 TABLET UNDER TONGUE AS NEEDED FOR CHEST PAIN 25 tablet prn   OneTouch Delica Lancets 55D MISC TEST 3 TIME DAILY Dx E11.40 300 each 3   ONETOUCH VERIO test strip TEST 3 TIME DAILY Dx E11.40 300 strip 3   rivaroxaban (XARELTO) 20 MG TABS tablet TAKE 1 TABLET (20 MG TOTAL) BY MOUTH DAILY WITH SUPPER. 90 tablet 1   tamsulosin (FLOMAX) 0.4 MG CAPS capsule Take 2 capsules (0.8 mg total) by mouth daily after supper. TAKE 1 CAPSULE BY MOUTH EVERYDAY AT BEDTIME 180 capsule 1   No current facility-administered medications on file prior to visit.    ROS  Review of Systems  Constitutional:  Negative for fever.  Respiratory:  Negative for shortness of breath.   Cardiovascular:  Negative for chest pain.  Musculoskeletal:  Negative for arthralgias.  Skin:  Negative for rash.   Objective:  BP (!) 160/65   Pulse 62   Temp 98 F (36.7 C)   Ht _0   (1.753 m)   Wt 233 lb (105.7 kg)   SpO2 97%   BMI 34.41 kg/m   BP Readings from Last 3 Encounters:  03/13/21 (!) 160/65  12/08/20 117/69  09/13/20 123/68    Wt Readings from Last 3 Encounters:  03/13/21 233 lb (105.7 kg)  12/08/20 226 lb 12.8 oz (102.9 kg)  09/13/20 223 lb 2 oz (101.2 kg)     Physical Exam Vitals reviewed.  Constitutional:      Appearance: He is well-developed.  HENT:     Head: Normocephalic and atraumatic.     Right Ear: External ear normal.     Left Ear: External ear normal.     Mouth/Throat:     Pharynx: No oropharyngeal exudate or posterior oropharyngeal erythema.  Eyes:     Pupils: Pupils are equal, round, and reactive to light.  Cardiovascular:     Rate and Rhythm: Normal rate and regular rhythm.     Heart sounds: No murmur heard. Pulmonary:     Effort: No respiratory distress.     Breath sounds: Normal breath sounds.  Musculoskeletal:     Cervical back: Normal range of motion and neck supple.  Neurological:     Mental Status: He is alert and oriented to person, place, and time.    Diabetic Foot Exam - Simple   No data filed       Assessment & Plan:   [name] was seen today for medical management of chronic issues.  Diagnoses and all orders for this visit:  Type 2 diabetes mellitus with diabetic neuropathy, without long-term current use of insulin (HCC) -     Bayer DCA Hb A1c Waived -     CBC with Differential/Platelet  Essential hypertension -     CMP14+EGFR  Mixed hyperlipidemia -     Lipid panel  Low hemoglobin -     CBC with Differential/Platelet  Diabetes mellitus without complication (HCC) -     glipiZIDE (GLUCOTROL) 10 MG tablet; Take 1 tablet (10 mg total) by mouth daily before breakfast.  Other orders -     metoprolol succinate (TOPROL-XL) 50 MG 24 hr tablet; Take 1 tablet (50 mg total) by mouth daily. Take with or immediately following a meal.  I am having Kaydence G. Doubek maintain his Multiple Vitamins-Minerals  (CENTRUM SILVER PO), cyanocobalamin, OneTouch Verio Reflect, MAGNESIUM PO, OneTouch Delica Lancets 93J, atorvastatin, lisinopril, nitroGLYCERIN, rivaroxaban, tamsulosin, CVS Blood Pressure Monitor, OneTouch Verio, glipiZIDE, and metoprolol succinate.  Meds ordered this encounter  Medications   glipiZIDE (GLUCOTROL) 10 MG tablet    Sig: Take 1 tablet (10 mg total) by mouth daily before breakfast.    Dispense:  90 tablet    Refill:  3   metoprolol succinate (TOPROL-XL) 50 MG 24 hr tablet    Sig: Take 1 tablet (50 mg total) by mouth daily. Take with or immediately following a meal.    Dispense:  90 tablet    Refill:  3     Follow-up: Return in about 3 months (around 06/13/2021).  Claretta Fraise, M.D.

## 2021-03-13 NOTE — Progress Notes (Signed)
Cardiology Office Note   Date:  03/15/2021   ID:  [name], DOB 1941/03/03, MRN [id number]  PCP:  Claretta Fraise, MD  Cardiologist:   Minus Breeding, MD  Referring:  Particia Nearing   Chief Complaint  Patient presents with   Atrial Fibrillation      History of Present Illness: [name] is a 80 y.o. male who presents for follow up of CAD. He had a DES to the LAD in 2/08. Repeat cath in 11/11 showed patent stent.  Echo in 2/16 showed EF 55-60% he did have some mild mitral regurgitation.  He returns for follow up.  Since I last saw dizziness he was previously.  This happens sporadically.  He has had no new chest pressure, neck or arm discomfort.  Has had no new shortness of breath, PND or orthopnea.  Has had no weight gain or edema.   Past Medical History:  Diagnosis Date   Coronary atherosclerosis of unspecified type of vessel, native or graft    Diabetes ([place])    Diverticulosis    Educated about COVID-19 virus infection 12/17/2018   ETD (eustachian tube dysfunction) 07/07/2013   Hepatomegaly    Hx of colonic polyps    Metabolic syndrome 27/51/7001   Obesity, unspecified    Obstructive sleep apnea    Uses CPAP.    Other and unspecified hyperlipidemia    Unspecified essential hypertension     Past Surgical History:  Procedure Laterality Date   COLONOSCOPY  9/17/20012   CORONARY STENT PLACEMENT  08/2006   LAD   POLYPECTOMY  09/17/20012   TONSILLECTOMY       Current Outpatient Medications  Medication Sig Dispense Refill   atorvastatin (LIPITOR) 40 MG tablet Take 1 tablet (40 mg total) by mouth daily. TAKE 1 TABLET 2 TIMES A WEEK (Patient taking differently: Take 40 mg by mouth daily.) 90 tablet 1   cyanocobalamin 1000 MCG tablet Take 1,000 mcg by mouth daily.     glipiZIDE (GLUCOTROL) 10 MG tablet Take 1 tablet (10 mg total) by mouth daily before breakfast. 90 tablet 3   lisinopril (ZESTRIL) 40 MG tablet Take 1 tablet (40 mg total) by mouth daily. 90 tablet  1   MAGNESIUM PO Take 420 mg by mouth.     metoprolol succinate (TOPROL-XL) 50 MG 24 hr tablet Take 1 tablet (50 mg total) by mouth daily. Take with or immediately following a meal. 90 tablet 3   Multiple Vitamins-Minerals (CENTRUM SILVER PO) Take 1 capsule by mouth daily.     nitroGLYCERIN (NITROSTAT) 0.4 MG SL tablet DISSOLVE 1 TABLET UNDER TONGUE AS NEEDED FOR CHEST PAIN 25 tablet prn   rivaroxaban (XARELTO) 20 MG TABS tablet TAKE 1 TABLET (20 MG TOTAL) BY MOUTH DAILY WITH SUPPER. 90 tablet 1   tamsulosin (FLOMAX) 0.4 MG CAPS capsule Take 2 capsules (0.8 mg total) by mouth daily after supper. TAKE 1 CAPSULE BY MOUTH EVERYDAY AT BEDTIME 180 capsule 1   Blood Glucose Monitoring Suppl (ONETOUCH VERIO REFLECT) w/Device KIT See admin instructions.     Blood Pressure Monitoring ([facility] BLOOD PRESSURE MONITOR) KIT DX I10 Monitor BP daily & as needed 1 kit 0   OneTouch Delica Lancets 74B MISC TEST 3 TIME DAILY Dx E11.40 300 each 3   ONETOUCH VERIO test strip TEST 3 TIME DAILY Dx E11.40 300 strip 3   No current facility-administered medications for this visit.    Allergies:   Patient has no known allergies.  ROS:  Please see the history of present illness.   Otherwise, review of systems are positive for none.   All other systems are reviewed and negative.    PHYSICAL EXAM: VS:  BP 118/60   Pulse (!) 55   Ht _0  (1.803 m)   Wt 229 lb (103.9 kg)   BMI 31.94 kg/m  , BMI Body mass index is 31.94 kg/m.  GENERAL:  Well appearing NECK:  No jugular venous distention, waveform within normal limits, carotid upstroke brisk and symmetric, no bruits, no thyromegaly LUNGS:  Clear to auscultation bilaterally CHEST:  Unremarkable HEART:  PMI not displaced or sustained,S1 and S2 within normal limits, no S3, no S4, no clicks, no rubs, 2 out of 6 brief apical murmur that I hear at the axilla and slightly at the aortic outflow tract, there are no diastolic murmurs ABD:  Flat, positive bowel sounds normal  in frequency in pitch, no bruits, no rebound, no guarding, no midline pulsatile mass, no hepatomegaly, no splenomegaly EXT:  2 plus pulses throughout, no edema, no cyanosis no clubbing  EKG:  EKG is  ordered today. The ekg demonstrates sinus rhythm, rate 55, axis within normal limits, intervals within normal limits, no acute ST-T wave changes.   Recent Labs: 03/13/2021: ALT 15; BUN 11; Creatinine, Ser 1.02; Hemoglobin 10.9; Platelets 81; Potassium 4.3; Sodium 142    Lipid Panel    Component Value Date/Time   CHOL 93 (L) 03/13/2021 1523   CHOL 138 11/06/2012 0945   TRIG 164 (H) 03/13/2021 1523   TRIG 108 07/07/2013 0952   TRIG 98 11/06/2012 0945   HDL 37 (L) 03/13/2021 1523   HDL 45 07/07/2013 0952   HDL 42 11/06/2012 0945   CHOLHDL 2.5 03/13/2021 1523   CHOLHDL 3 09/09/2014 1451   VLDL 24.6 09/09/2014 1451   LDLCALC 29 03/13/2021 1523   LDLCALC 74 07/07/2013 0952   LDLCALC 76 11/06/2012 0945      Wt Readings from Last 3 Encounters:  03/15/21 229 lb (103.9 kg)  03/13/21 233 lb (105.7 kg)  12/08/20 226 lb 12.8 oz (102.9 kg)      Other studies Reviewed: Additional studies/ records that were reviewed today include: Labs Review of the above records demonstrates: See elsewhere  ASSESSMENT AND PLAN:   CAD:    The patient has no new sypmtoms.  No further cardiovascular testing is indicated.  We will continue with aggressive risk reduction and meds as listed.  DYSLIPIDEMIA:   LDL was 29 recently with an HDL of 37.  No change in therapy.   ATRIAL FIB:     He is in sinus rhythm today.  He does not notice when he is in atrial fibrillation.  He is not having any problems taking anticoagulation.  No change in therapy.   DM:   A1c is 6.3.  No change in therapy.  , MR: I will follow-up with an echocardiogram.   Current medicines are reviewed at length with the patient today.  The patient does not have concerns regarding medicines.  The following changes have been made:     None  Labs/ tests ordered today include:     Orders Placed This Encounter  Procedures   EKG 12-Lead   ECHOCARDIOGRAM COMPLETE      Disposition:   FU with me in 12 months   Signed, Minus Breeding, MD  03/15/2021 12:41 PM    [place]

## 2021-03-14 LAB — CMP14+EGFR
ALT: 15 IU/L (ref 0–44)
AST: 20 IU/L (ref 0–40)
Albumin/Globulin Ratio: 1.6 (ref 1.2–2.2)
Albumin: 4.2 g/dL (ref 3.7–4.7)
Alkaline Phosphatase: 87 IU/L (ref 44–121)
BUN/Creatinine Ratio: 11 (ref 10–24)
BUN: 11 mg/dL (ref 8–27)
Bilirubin Total: 0.5 mg/dL (ref 0.0–1.2)
CO2: 25 mmol/L (ref 20–29)
Calcium: 9.4 mg/dL (ref 8.6–10.2)
Chloride: 105 mmol/L (ref 96–106)
Creatinine, Ser: 1.02 mg/dL (ref 0.76–1.27)
Globulin, Total: 2.7 g/dL (ref 1.5–4.5)
Glucose: 150 mg/dL — ABNORMAL HIGH (ref 65–99)
Potassium: 4.3 mmol/L (ref 3.5–5.2)
Sodium: 142 mmol/L (ref 134–144)
Total Protein: 6.9 g/dL (ref 6.0–8.5)
eGFR: 75 mL/min/{1.73_m2} (ref >59)

## 2021-03-14 LAB — LIPID PANEL
Chol/HDL Ratio: 2.5 ratio (ref 0.0–5.0)
Cholesterol, Total: 93 mg/dL — ABNORMAL LOW (ref 100–199)
HDL: 37 mg/dL — ABNORMAL LOW (ref >39)
LDL Chol Calc (NIH): 29 mg/dL (ref 0–99)
Triglycerides: 164 mg/dL — ABNORMAL HIGH (ref 0–149)
VLDL Cholesterol Cal: 27 mg/dL (ref 5–40)

## 2021-03-14 LAB — CBC WITH DIFFERENTIAL/PLATELET
Basophils Absolute: 0 10*3/uL (ref 0.0–0.2)
Basos: 1 %
EOS (ABSOLUTE): 0.1 10*3/uL (ref 0.0–0.4)
Eos: 2 %
Hematocrit: 32.7 % — ABNORMAL LOW (ref 37.5–51.0)
Hemoglobin: 10.9 g/dL — ABNORMAL LOW (ref 13.0–17.7)
Immature Grans (Abs): 0 10*3/uL (ref 0.0–0.1)
Immature Granulocytes: 0 %
Lymphocytes Absolute: 1.2 10*3/uL (ref 0.7–3.1)
Lymphs: 28 %
MCH: 28.9 pg (ref 26.6–33.0)
MCHC: 33.3 g/dL (ref 31.5–35.7)
MCV: 87 fL (ref 79–97)
Monocytes Absolute: 0.4 10*3/uL (ref 0.1–0.9)
Monocytes: 10 %
NRBC: 1 % — ABNORMAL HIGH (ref 0–0)
Neutrophils Absolute: 2.5 10*3/uL (ref 1.4–7.0)
Neutrophils: 59 %
Platelets: 81 10*3/uL — CL (ref 150–450)
RBC: 3.77 x10E6/uL — ABNORMAL LOW (ref 4.14–5.80)
RDW: 14.2 % (ref 11.6–15.4)
WBC: 4.2 10*3/uL (ref 3.4–10.8)

## 2021-03-15 ENCOUNTER — Ambulatory Visit: Payer: Medicare HMO | Admitting: Cardiology

## 2021-03-15 ENCOUNTER — Encounter: Payer: Self-pay | Admitting: Cardiology

## 2021-03-15 ENCOUNTER — Other Ambulatory Visit: Payer: Self-pay

## 2021-03-15 VITALS — BP 118/60 | HR 55 | Ht 71.0 in | Wt 229.0 lb

## 2021-03-15 DIAGNOSIS — E785 Hyperlipidemia, unspecified: Secondary | ICD-10-CM | POA: Diagnosis not present

## 2021-03-15 DIAGNOSIS — I4891 Unspecified atrial fibrillation: Secondary | ICD-10-CM | POA: Diagnosis not present

## 2021-03-15 DIAGNOSIS — I340 Nonrheumatic mitral (valve) insufficiency: Secondary | ICD-10-CM

## 2021-03-15 DIAGNOSIS — I2510 Atherosclerotic heart disease of native coronary artery without angina pectoris: Secondary | ICD-10-CM | POA: Diagnosis not present

## 2021-03-15 DIAGNOSIS — R42 Dizziness and giddiness: Secondary | ICD-10-CM

## 2021-03-15 NOTE — Patient Instructions (Signed)
Medication Instructions:  The current medical regimen is effective;  continue present plan and medications.  *If you need a refill on your cardiac medications before your next appointment, please call your pharmacy*  Testing/Procedures: Your physician has requested that you have an echocardiogram. Echocardiography is a painless test that uses sound waves to create images of your heart. It provides your doctor with information about the size and shape of your heart and how well your heart's chambers and valves are working. This procedure takes approximately one hour. There are no restrictions for this procedure. This will be completed at our [place] office [facility].  Follow-Up: At [facility], you and your health needs are our priority.  As part of our continuing mission to provide you with exceptional heart care, we have created designated Provider Care Teams.  These Care Teams include your primary Cardiologist (physician) and Advanced Practice Providers (APPs -  Physician Assistants and Nurse Practitioners) who all work together to provide you with the care you need, when you need it.  We recommend signing up for the patient portal called "MyChart".  Sign up information is provided on this After Visit Summary.  MyChart is used to connect with patients for Virtual Visits (Telemedicine).  Patients are able to view lab/test results, encounter notes, upcoming appointments, etc.  Non-urgent messages can be sent to your provider as well.   To learn more about what you can do with MyChart, go to [email or web address].    Your next appointment:   1 year(s)  The format for your next appointment:   In Person  Provider:   Minus Breeding, MD   Thank you for choosing [facility]!!

## 2021-03-16 ENCOUNTER — Telehealth: Payer: Self-pay | Admitting: Cardiology

## 2021-03-16 NOTE — Telephone Encounter (Signed)
Checking percert on the following patient for testing scheduled at [facility].    ECHO  04/10/2021

## 2021-03-17 NOTE — Progress Notes (Signed)
Hello Darell,  Your lab result is normal and/or stable.Some minor variations that are not significant are commonly marked abnormal, but do not represent any medical problem for you.  Best regards, Navid Lenzen, M.D.

## 2021-04-10 ENCOUNTER — Ambulatory Visit (HOSPITAL_COMMUNITY)
Admission: RE | Admit: 2021-04-10 | Discharge: 2021-04-10 | Disposition: A | Discharge status: Home / Self Care | Payer: Medicare HMO | Source: Ambulatory Visit | Attending: Cardiology | Admitting: Cardiology

## 2021-04-10 ENCOUNTER — Other Ambulatory Visit: Payer: Self-pay

## 2021-04-10 DIAGNOSIS — I2510 Atherosclerotic heart disease of native coronary artery without angina pectoris: Secondary | ICD-10-CM | POA: Diagnosis not present

## 2021-04-10 DIAGNOSIS — I340 Nonrheumatic mitral (valve) insufficiency: Secondary | ICD-10-CM

## 2021-04-10 LAB — ECHOCARDIOGRAM COMPLETE
Area-P 1/2: 2.6 cm2
S' Lateral: 1.9 cm

## 2021-04-10 NOTE — Progress Notes (Signed)
*  PRELIMINARY RESULTS* Echocardiogram 2D Echocardiogram has been performed.  [name] 04/10/2021, 11:28 AM

## 2021-04-11 ENCOUNTER — Telehealth: Payer: Self-pay | Admitting: Family Medicine

## 2021-04-11 NOTE — Telephone Encounter (Signed)
TC to [employer] at the Pharm D # [phone] who gave me another number [phone] to call to get patient BP machine ordered.   [facility], [facility] but a PA will have to be called into the [place] provider line.

## 2021-04-12 ENCOUNTER — Telehealth: Payer: Self-pay | Admitting: Family Medicine

## 2021-04-12 NOTE — Telephone Encounter (Signed)
Left message for patient to call back and schedule Medicare Annual Wellness Visit (AWV) either virtually or in office. Left both office number and my number [phone]   Last AWV ;09/14/17  please schedule at anytime with health coach

## 2021-04-17 ENCOUNTER — Telehealth: Payer: Self-pay | Admitting: Cardiology

## 2021-04-17 NOTE — Telephone Encounter (Signed)
Patient is requesting to go over his echo results.

## 2021-04-17 NOTE — Telephone Encounter (Signed)
Called patient, advised of results.  Patient wife requested it be mailed to them.

## 2021-04-19 ENCOUNTER — Ambulatory Visit (INDEPENDENT_AMBULATORY_CARE_PROVIDER_SITE_OTHER): Payer: Medicare HMO

## 2021-04-19 VITALS — Ht 71.0 in | Wt 225.0 lb

## 2021-04-19 DIAGNOSIS — Z0000 Encounter for general adult medical examination without abnormal findings: Secondary | ICD-10-CM | POA: Diagnosis not present

## 2021-04-19 NOTE — Progress Notes (Signed)
Subjective:   [name] is a 80 y.o. male who presents for Medicare Annual/Subsequent preventive examination.  Virtual Visit via Telephone Note  I connected with  DOMINQUE MARLIN on 04/19/21 at  9:45 AM EDT by telephone and verified that I am speaking with the correct person using two identifiers.  Location: Patient: Home Provider: [facility] Persons participating in the virtual visit: patient/Nurse Health Advisor   I discussed the limitations, risks, security and privacy concerns of performing an evaluation and management service by telephone and the availability of in person appointments. The patient expressed understanding and agreed to proceed.  Interactive audio and video telecommunications were attempted between this nurse and patient, however failed, due to patient having technical difficulties OR patient did not have access to video capability.  We continued and completed visit with audio only.  Some vital signs may be absent or patient reported.   [place] Kinyon E Ezel Vallone, LPN   Review of Systems     Cardiac Risk Factors include: advanced age (>71mn, >>75women);male gender;dyslipidemia;obesity (BMI >30kg/m2);sedentary lifestyle;hypertension;diabetes mellitus;Other (see comment), Risk factor comments: A. Fib, thrombocytopenia     Objective:    Today's Vitals   04/19/21 0953  Weight: 225 lb (102.1 kg)  Height: _0  (1.803 m)   Body mass index is 31.38 kg/m.  Advanced Directives 04/19/2021 11/19/2019 07/15/2019 06/30/2019 06/29/2019 09/04/2017 09/04/2017  Does Patient Have a Medical Advance Directive? _1  No No  Would patient like information on creating a medical advance directive? No - Patient declined No - Patient declined No - Patient declined No - Patient declined No - Patient declined Yes (MAU/Ambulatory/Procedural Areas - Information given) No - Patient declined    Current Medications (verified) Outpatient Encounter Medications as of 04/19/2021  Medication Sig    atorvastatin (LIPITOR) 40 MG tablet Take 1 tablet (40 mg total) by mouth daily. TAKE 1 TABLET 2 TIMES A WEEK (Patient taking differently: Take 40 mg by mouth daily.)   Blood Glucose Monitoring Suppl (ONETOUCH VERIO REFLECT) w/Device KIT See admin instructions.   Blood Pressure Monitoring ([employer] BLOOD PRESSURE MONITOR) KIT DX I10 Monitor BP daily & as needed   cyanocobalamin 1000 MCG tablet Take 1,000 mcg by mouth daily.   glipiZIDE (GLUCOTROL) 10 MG tablet Take 1 tablet (10 mg total) by mouth daily before breakfast.   lisinopril (ZESTRIL) 40 MG tablet Take 1 tablet (40 mg total) by mouth daily.   MAGNESIUM PO Take 420 mg by mouth.   metoprolol succinate (TOPROL-XL) 50 MG 24 hr tablet Take 1 tablet (50 mg total) by mouth daily. Take with or immediately following a meal.   Multiple Vitamins-Minerals (CENTRUM SILVER PO) Take 1 capsule by mouth daily.   nitroGLYCERIN (NITROSTAT) 0.4 MG SL tablet DISSOLVE 1 TABLET UNDER TONGUE AS NEEDED FOR CHEST PAIN   OneTouch Delica Lancets 388KMISC TEST 3 TIME DAILY Dx E11.40   ONETOUCH VERIO test strip TEST 3 TIME DAILY Dx E11.40   rivaroxaban (XARELTO) 20 MG TABS tablet TAKE 1 TABLET (20 MG TOTAL) BY MOUTH DAILY WITH SUPPER.   tamsulosin (FLOMAX) 0.4 MG CAPS capsule Take 2 capsules (0.8 mg total) by mouth daily after supper. TAKE 1 CAPSULE BY MOUTH EVERYDAY AT BEDTIME   No facility-administered encounter medications on file as of 04/19/2021.    Allergies (verified) Patient has no known allergies.   History: Past Medical History:  Diagnosis Date   Coronary atherosclerosis of unspecified type of vessel, native or graft    Diabetes (  [place])    Diverticulosis    Educated about COVID-19 virus infection 12/17/2018   ETD (eustachian tube dysfunction) 07/07/2013   Hepatomegaly    Hx of colonic polyps    Metabolic syndrome 54/56/2563   Obesity, unspecified    Obstructive sleep apnea    Uses CPAP.    Other and unspecified hyperlipidemia    Unspecified essential  hypertension    Past Surgical History:  Procedure Laterality Date   COLONOSCOPY  9/17/20012   CORONARY STENT PLACEMENT  08/2006   LAD   POLYPECTOMY  09/17/20012   TONSILLECTOMY     Family History  Problem Relation Age of Onset   Heart disease Father        and Multiple family members on father Side    Heart attack Father    Cancer Mother    Hypertension Mother    Hypertension Sister    Healthy Son    Irritable bowel syndrome Maternal Grandmother    Colon cancer Neg Hx    Social History   Socioeconomic History   Marital status: Married    Spouse name: Not on file   Number of children: 1   Years of education: 12 years   Highest education level: Some college, no degree  Occupational History   Occupation: Nurse, learning disability    Comment: Retired    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 He is married and lives at home with his wife. He has one adult son. Their teenage granddaughter has recently moved in with them.    Right handed    Caffeine~ does not use    Social Determinants of Health   Financial Resource Strain: Medium Risk   Difficulty of Paying Living Expenses: Somewhat hard  Food Insecurity: Food Insecurity Present   Worried About Charity fundraiser in the Last Year: Sometimes true   Ran Out of Food in the Last Year: Never true  Transportation Needs: No Transportation Needs   Lack of  Transportation (Medical): No   Lack of Transportation (Non-Medical): No  Physical Activity: Insufficiently Active   Days of Exercise per Week: 7 days   Minutes of Exercise per Session: 10 min  Stress: No Stress Concern Present   Feeling of Stress : Not at all  Social Connections: Socially Integrated   Frequency of Communication with Friends and Family: Not on file   Frequency of Social Gatherings with Friends and Family: More than three times a week   Attends Religious Services: More than 4 times per year   Active Member of [employer] or Organizations: Yes   Attends Archivist Meetings: More than 4 times per year   Marital Status: Married    Tobacco Counseling Counseling given: Not Answered  Clinical Intake:  Pre-visit preparation completed: Yes  Pain : No/denies pain     BMI - recorded: 31.38 Nutritional Status: BMI > 30  Obese Nutritional Risks: None Diabetes: Yes CBG done?: No Did pt. bring in CBG monitor from home?: No  How often do you need to have someone help you when you read instructions, pamphlets, or other written materials from your doctor or pharmacy?: 1 - Never  Diabetic? Nutrition Risk Assessment:  Has the patient had any N/V/D within the last 2 months?  No  Does the patient have any non-healing wounds?  No  Has the patient had any unintentional weight loss or weight gain?  No   Diabetes:  Is the patient diabetic?  Yes  If diabetic, was a CBG obtained today?  No  Did the patient bring in their glucometer from home?  No  How often do you monitor your CBG's? BID - 120 this morning per patient.   Financial Strains and Diabetes Management:  Are you having any financial strains with the device, your supplies or your medication? Yes .  Does the patient want to be seen by Chronic Care Management for management of their diabetes?  No  Would the patient like to be referred to a Nutritionist or for Diabetic Management?  No   Diabetic  Exams:  Diabetic Eye Exam: Com[facility] 06/2020 at [facility].  Diabetic Foot Exam: Completed 12/08/20. Pt has been advised about the importance in completing this exam. Pt is scheduled for diabetic foot exam on next year.    Interpreter Needed?: No  Information entered by :: Doriana Mazurkiewicz, LPN   Activities of Daily Living In your present state of health, do you have any difficulty performing the following activities: 04/19/2021  Hearing? Y  Comment wears hearing aids  Vision? N  Difficulty concentrating or making decisions? N  Walking or climbing stairs? Y  Comment just slow  Dressing or bathing? N  Doing errands, shopping? N  Preparing Food and eating ? N  Using the Toilet? N  In the past six months, have you accidently leaked urine? N  Do you have problems with loss of bowel control? N  Managing your Medications? N  Managing your Finances? N  Housekeeping or managing your Housekeeping? N  Some recent data might be hidden    Patient Care Team: Claretta Fraise, MD as PCP - General (Family Medicine) Larey Dresser, MD as Consulting Physician (Cardiology) Irene Shipper, MD as Consulting Physician (Gastroenterology)  Indicate any recent Medical Services you may have received from other than [facility] providers in the past year (date may be approximate).     Assessment:   This is a routine wellness examination for [place].  Hearing/Vision screen Hearing Screening - Comments:: Wears hearing aids - from [place] - Comments:: Wears reading glasses prn - up to date with annual eye exams at [facility] issues and exercise activities discussed: Current Exercise Habits: Home exercise routine, Type of exercise: walking;stretching, Time (Minutes): 15, Frequency (Times/Week): 7, Weekly Exercise (Minutes/Week): 105, Intensity: Mild, Exercise limited by: orthopedic condition(s);cardiac condition(s);neurologic condition(s)   Goals Addressed             This Visit's Progress    Exercise  3x per week (30 min per time)   On track    Continue to exercise 3 times per week       Depression Screen [facility] 2/9 Scores 04/19/2021 03/13/2021 12/08/2020 09/13/2020 06/09/2020 03/09/2020 12/09/2019  PHQ - 2 Score 0  0 0 0 0 0 0  PHQ- 9 Score - - - - - - -    Fall Risk Fall Risk  04/19/2021 03/13/2021 03/13/2021 12/08/2020 09/13/2020  Falls in the past year? 1 1 0 0 0  Number falls in past yr: 0 0 - - -  Injury with Fall? 0 0 - - -  Risk for fall due to : History of fall(s);Impaired balance/gait History of fall(s) - - -  Follow up Education provided;Falls prevention discussed Falls evaluation completed - - Falls evaluation completed    FALL RISK PREVENTION PERTAINING TO THE HOME:  Any stairs in or around the home? Yes  If so, are there any without handrails? No  Home free of loose throw rugs in walkways, pet beds, electrical cords, etc? Yes  Adequate lighting in your home to reduce risk of falls? Yes   ASSISTIVE DEVICES UTILIZED TO PREVENT FALLS:  Life alert? No  Use of a cane, walker or w/c? Yes  Grab bars in the bathroom? Yes  Shower chair or bench in shower? No  Elevated toilet seat or a handicapped toilet? No   TIMED UP AND GO:  Was the test performed? No . Telephonic visit  Cognitive Function: MMSE - Mini Mental State Exam 09/04/2017 05/25/2016 12/21/2014  Orientation to time _0 Orientation to Place _1 Registration _2 Attention/ Calculation _3 Recall _4 Language- name 2 objects _5 Language- repeat _6 Language- follow 3 step command _7 Language- read & follow direction _8 Write a sentence _9 Copy design _10 Total score _11 Immunizations Immunization History  Administered Date(s) Administered   Fluad Quad(high Dose 65+) 05/11/2019   Influenza Split 04/29/2013   Influenza,inj,Quad PF,6+ Mos 05/02/2015   Influenza-Unspecified 05/26/2014, 05/15/2016, 05/20/2017   Moderna Sars-Covid-2 Vaccination 08/22/2019,  09/19/2019, 03/30/2020, 10/28/2020   Pneumococcal Conjugate-13 06/14/2014   Pneumococcal Polysaccharide-23 06/09/2010   Tdap 01/12/2011   Zoster, Live 10/29/2014    TDAP status: Due, Education has been provided regarding the importance of this vaccine. Advised may receive this vaccine at local pharmacy or Health Dept. Aware to provide a copy of the vaccination record if obtained from local pharmacy or Health Dept. Verbalized acceptance and understanding.  Flu Vaccine status: Due, Education has been provided regarding the importance of this vaccine. Advised may receive this vaccine at local pharmacy or Health Dept. Aware to provide a copy of the vaccination record if obtained from local pharmacy or Health Dept. Verbalized acceptance and understanding.  Pneumococcal vaccine status: Up to date  Covid-19 vaccine status: Completed vaccines  Qualifies for Shingles Vaccine? Yes   Zostavax completed Yes   Shingrix Completed?: No.    Education has been provided regarding the importance of this vaccine. Patient has been advised to call insurance company to determine out of pocket expense if they have not yet received this vaccine. Advised may also receive vaccine at local pharmacy or Health Dept. Verbalized acceptance and understanding.  Screening Tests Health Maintenance  Topic Date Due   OPHTHALMOLOGY EXAM  Never done   INFLUENZA VACCINE  02/27/2021   COVID-19 Vaccine (5 - Booster for Moderna series) 02/27/2021   Zoster Vaccines- Shingrix (1 of 2) 06/13/2021 (Originally 12/12/1959)   TETANUS/TDAP  03/13/2022 (Originally 01/11/2021)   HEMOGLOBIN A1C  09/13/2021  FOOT EXAM  12/08/2021   COLONOSCOPY (Pts 45-33yr Insurance coverage will need to be confirmed)  10/14/2024   Hepatitis C Screening  Completed   HPV VACCINES  Aged Out    Health Maintenance  Health Maintenance Due  Topic Date Due   OPHTHALMOLOGY EXAM  Never done   INFLUENZA VACCINE  02/27/2021   COVID-19 Vaccine (5 - Booster for  Moderna series) 02/27/2021    Colorectal cancer screening: No longer required.   Lung Cancer Screening: (Low Dose CT Chest recommended if Age 79-80years, 30 pack-year currently smoking OR have quit w/in 15years.) does not qualify.   Additional Screening:  Hepatitis C Screening: does not qualify  Vision Screening: Recommended annual ophthalmology exams for early detection of glaucoma and other disorders of the eye. Is the patient up to date with their annual eye exam?  Yes  Who is the provider or what is the name of the office in which the patient attends [facility] exams[facility] If pt is not established with a provider, would they like to be referred to a provider to establish care? No .   Dental Screening: Recommended annual dental exams for proper oral hygiene  Community Resource Referral / Chronic Care Management: CRR required this visit?  No   CCM required this visit?  No      Plan:     I have personally reviewed and noted the following in the patient's chart:   Medical and social history Use of alcohol, tobacco or illicit drugs  Current medications and supplements including opioid prescriptions. Patient is not currently taking opioid prescriptions. Functional ability and status Nutritional status Physical activity Advanced directives List of other physicians Hospitalizations, surgeries, and ER visits in previous 12 months Vitals Screenings to include cognitive, depression, and falls Referrals and appointments  In addition, I have reviewed and discussed with patient certain preventive protocols, quality metrics, and best practice recommendations. A written personalized care plan for preventive services as well as general preventive health recommendations were provided to patient.     ASandrea Hammond LPN   97/90/2409  Nurse Notes: None

## 2021-04-19 NOTE — Patient Instructions (Signed)
[name] , Thank you for taking time to come for your Medicare Wellness Visit. I appreciate your ongoing commitment to your health goals. Please review the following plan we discussed and let me know if I can assist you in the future.   Screening recommendations/referrals: Colonoscopy: Done 10/15/2019 - repeat not required Recommended yearly ophthalmology/optometry visit for glaucoma screening and checkup Recommended yearly dental visit for hygiene and checkup  Vaccinations: Influenza vaccine: Done every fall - due now Pneumococcal vaccine: Done 06/09/2010 & 06/14/2014 Tdap vaccine: Done 01/12/2011 - Repeat in 10 years  Shingles vaccine: Zostavax done 2016 - If you had Shingrix at the [place], please bring [place] those dates   Covid-19: Done 08/22/2019, 09/19/2019, 03/2020, & 10/2020  Advanced directives: Please bring a copy of your health care power of attorney and living will to the office to be added to your chart at your convenience.   Conditions/risks identified: Aim for 30 minutes of exercise or brisk walking each day, drink 6-8 glasses of water and eat lots of fruits and vegetables.   Next appointment: Follow up in one year for your annual wellness visit.   Preventive Care 80 Years and Older, Male  Preventive care refers to lifestyle choices and visits with your health care provider that can promote health and wellness. What does preventive care include? A yearly physical exam. This is also called an annual well check. Dental exams once or twice a year. Routine eye exams. Ask your health care provider how often you should have your eyes checked. Personal lifestyle choices, including: Daily care of your teeth and gums. Regular physical activity. Eating a healthy diet. Avoiding tobacco and drug use. Limiting alcohol use. Practicing safe sex. Taking low doses of aspirin every day. Taking vitamin and mineral supplements as recommended by your health care provider. What happens during an  annual well check? The services and screenings done by your health care provider during your annual well check will depend on your age, overall health, lifestyle risk factors, and family history of disease. Counseling  Your health care provider may ask you questions about your: Alcohol use. Tobacco use. Drug use. Emotional well-being. Home and relationship well-being. Sexual activity. Eating habits. History of falls. Memory and ability to understand (cognition). Work and work Statistician. Screening  You may have the following tests or measurements: Height, weight, and BMI. Blood pressure. Lipid and cholesterol levels. These may be checked every 5 years, or more frequently if you are over 15 years old. Skin check. Lung cancer screening. You may have this screening every year starting at age 30 if you have a 30-pack-year history of smoking and currently smoke or have quit within the past 15 years. Fecal occult blood test (FOBT) of the stool. You may have this test every year starting at age 74. Flexible sigmoidoscopy or colonoscopy. You may have a sigmoidoscopy every 5 years or a colonoscopy every 10 years starting at age 9. Prostate cancer screening. Recommendations will vary depending on your family history and other risks. Hepatitis C blood test. Hepatitis B blood test. Sexually transmitted disease (STD) testing. Diabetes screening. This is done by checking your blood sugar (glucose) after you have not eaten for a while (fasting). You may have this done every 1-3 years. Abdominal aortic aneurysm (AAA) screening. You may need this if you are a current or former smoker. Osteoporosis. You may be screened starting at age 23 if you are at high risk. Talk with your health care provider about your test results,  treatment options, and if necessary, the need for more tests. Vaccines  Your health care provider may recommend certain vaccines, such as: Influenza vaccine. This is recommended  every year. Tetanus, diphtheria, and acellular pertussis (Tdap, Td) vaccine. You may need a Td booster every 10 years. Zoster vaccine. You may need this after age 58. Pneumococcal 13-valent conjugate (PCV13) vaccine. One dose is recommended after age 18. Pneumococcal polysaccharide (PPSV23) vaccine. One dose is recommended after age 63. Talk to your health care provider about which screenings and vaccines you need and how often you need them. This information is not intended to replace advice given to you by your health care provider. Make sure you discuss any questions you have with your health care provider. Document Released: 08/12/2015 Document Revised: 04/04/2016 Document Reviewed: 05/17/2015 Elsevier Interactive Patient Education  2017 [place] Prevention in the Home Falls can cause injuries. They can happen to people of all ages. There are many things you can do to make your home safe and to help prevent falls. What can I do on the outside of my home? Regularly fix the edges of walkways and driveways and fix any cracks. Remove anything that might make you trip as you walk through a door, such as a raised step or threshold. Trim any bushes or trees on the path to your home. Use bright outdoor lighting. Clear any walking paths of anything that might make someone trip, such as rocks or tools. Regularly check to see if handrails are loose or broken. Make sure that both sides of any steps have handrails. Any raised decks and porches should have guardrails on the edges. Have any leaves, snow, or ice cleared regularly. Use sand or salt on walking paths during winter. Clean up any spills in your garage right away. This includes oil or grease spills. What can I do in the bathroom? Use night lights. Install grab bars by the toilet and in the tub and shower. Do not use towel bars as grab bars. Use non-skid mats or decals in the tub or shower. If you need to sit down in the shower,  use a plastic, non-slip stool. Keep the floor dry. Clean up any water that spills on the floor as soon as it happens. Remove soap buildup in the tub or shower regularly. Attach bath mats securely with double-sided non-slip rug tape. Do not have throw rugs and other things on the floor that can make you trip. What can I do in the bedroom? Use night lights. Make sure that you have a light by your bed that is easy to reach. Do not use any sheets or blankets that are too big for your bed. They should not hang down onto the floor. Have a firm chair that has side arms. You can use this for support while you get dressed. Do not have throw rugs and other things on the floor that can make you trip. What can I do in the kitchen? Clean up any spills right away. Avoid walking on wet floors. Keep items that you use a lot in easy-to-reach places. If you need to reach something above you, use a strong step stool that has a grab bar. Keep electrical cords out of the way. Do not use floor polish or wax that makes floors slippery. If you must use wax, use non-skid floor wax. Do not have throw rugs and other things on the floor that can make you trip. What can I do with my stairs? Do  not leave any items on the stairs. Make sure that there are handrails on both sides of the stairs and use them. Fix handrails that are broken or loose. Make sure that handrails are as long as the stairways. Check any carpeting to make sure that it is firmly attached to the stairs. Fix any carpet that is loose or worn. Avoid having throw rugs at the top or bottom of the stairs. If you do have throw rugs, attach them to the floor with carpet tape. Make sure that you have a light switch at the top of the stairs and the bottom of the stairs. If you do not have them, ask someone to add them for you. What else can I do to help prevent falls? Wear shoes that: Do not have high heels. Have rubber bottoms. Are comfortable and fit you  well. Are closed at the toe. Do not wear sandals. If you use a stepladder: Make sure that it is fully opened. Do not climb a closed stepladder. Make sure that both sides of the stepladder are locked into place. Ask someone to hold it for you, if possible. Clearly mark and make sure that you can see: Any grab bars or handrails. First and last steps. Where the edge of each step is. Use tools that help you move around (mobility aids) if they are needed. These include: Canes. Walkers. Scooters. Crutches. Turn on the lights when you go into a dark area. Replace any light bulbs as soon as they burn out. Set up your furniture so you have a clear path. Avoid moving your furniture around. If any of your floors are uneven, fix them. If there are any pets around you, be aware of where they are. Review your medicines with your doctor. Some medicines can make you feel dizzy. This can increase your chance of falling. Ask your doctor what other things that you can do to help prevent falls. This information is not intended to replace advice given to you by your health care provider. Make sure you discuss any questions you have with your health care provider. Document Released: 05/12/2009 Document Revised: 12/22/2015 Document Reviewed: 08/20/2014 Elsevier Interactive Patient Education  2017 [employer].

## 2021-04-19 NOTE — Telephone Encounter (Signed)
TC to [facility], they have never been able to bill an insurance for this and it would have to go to a DME supplier. I have sent this to [employer] which declined & said to send to a local retailer, I have also looked up [employer] and they do not care BP monitors. Pt will call [employer] back to see again what information he can get from them.

## 2021-05-23 DIAGNOSIS — L03032 Cellulitis of left toe: Secondary | ICD-10-CM | POA: Diagnosis not present

## 2021-05-23 DIAGNOSIS — G4733 Obstructive sleep apnea (adult) (pediatric): Secondary | ICD-10-CM | POA: Diagnosis not present

## 2021-05-23 DIAGNOSIS — G609 Hereditary and idiopathic neuropathy, unspecified: Secondary | ICD-10-CM | POA: Diagnosis not present

## 2021-06-12 ENCOUNTER — Encounter: Payer: Self-pay | Admitting: Family Medicine

## 2021-06-12 ENCOUNTER — Ambulatory Visit (INDEPENDENT_AMBULATORY_CARE_PROVIDER_SITE_OTHER): Payer: Medicare HMO | Admitting: Family Medicine

## 2021-06-12 ENCOUNTER — Other Ambulatory Visit: Payer: Self-pay

## 2021-06-12 VITALS — BP 132/56 | HR 61 | Temp 98.4°F | Ht 71.0 in | Wt 235.6 lb

## 2021-06-12 DIAGNOSIS — N401 Enlarged prostate with lower urinary tract symptoms: Secondary | ICD-10-CM | POA: Diagnosis not present

## 2021-06-12 DIAGNOSIS — R350 Frequency of micturition: Secondary | ICD-10-CM

## 2021-06-12 DIAGNOSIS — I10 Essential (primary) hypertension: Secondary | ICD-10-CM

## 2021-06-12 DIAGNOSIS — I4891 Unspecified atrial fibrillation: Secondary | ICD-10-CM

## 2021-06-12 DIAGNOSIS — G629 Polyneuropathy, unspecified: Secondary | ICD-10-CM | POA: Diagnosis not present

## 2021-06-12 DIAGNOSIS — E782 Mixed hyperlipidemia: Secondary | ICD-10-CM | POA: Diagnosis not present

## 2021-06-12 DIAGNOSIS — Z125 Encounter for screening for malignant neoplasm of prostate: Secondary | ICD-10-CM | POA: Diagnosis not present

## 2021-06-12 DIAGNOSIS — E1140 Type 2 diabetes mellitus with diabetic neuropathy, unspecified: Secondary | ICD-10-CM

## 2021-06-12 LAB — BAYER DCA HB A1C WAIVED: HB A1C (BAYER DCA - WAIVED): 6.3 % — ABNORMAL HIGH (ref 4.8–5.6)

## 2021-06-12 NOTE — Patient Instructions (Signed)
Please hold xarelto for 5 days before any medical or dental procedure.

## 2021-06-12 NOTE — Progress Notes (Signed)
 Subjective:  Patient ID: [name],  male    DOB: 08/27/1940  Age: 80 y.o.    CC: Medical Management of Chronic Issues   HPI [name] presents for  follow-up of hypertension. Patient has no history of headache chest pain or shortness of breath or recent cough. Patient also denies symptoms of TIA such as numbness weakness lateralizing. Patient denies side effects from medication. States taking it regularly.  Patient also  in for follow-up of elevated cholesterol. Doing well without complaints on current medication. Denies side effects  including myalgia and arthralgia and nausea. Also in today for liver function testing. Currently no chest pain, shortness of breath or other cardiovascular related symptoms noted.  Follow-up of diabetes. Patient does check blood sugar at home. Readings run between 105 and 150 both fasting and after meals.  Patient denies symptoms such as excessive hunger or urinary frequency, excessive hunger, nausea No significant hypoglycemic spells noted. Occasional - 2-3 times a year - mild symptom responds to OJ or glucose tablet  Medications reviewed. Pt reports taking them regularly. Pt. denies complication/adverse reaction today.   Atrial fibrillation follow up. Pt. is treated with rate control and anticoagulation. Pt.  denies palpitations, rapid rate, chest pain, dyspnea and edema. There has been no bleeding from nose or gums. Pt. has not noticed blood with urine or stool.  Although there is routine bruising easily, it is not excessive. Did have a lot of bleeding with a dental procedure this summer.    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   He has a past surgical history that includes Coronary stent placement (08/2006); Colonoscopy (9/17/20012); Polypectomy (09/17/20012); and Tonsillectomy.   His family history includes Cancer in his mother; Healthy in his son; Heart attack in his father; Heart disease in his father; Hypertension in his mother and sister; Irritable bowel syndrome in his maternal grandmother.He reports that he quit smoking about 50 years ago. His smoking use included cigarettes. He smoked an average of 1 pack per day. He quit smokeless tobacco use about 31 years ago.  His smokeless tobacco use included chew. He reports current alcohol use of about 2.0 standard drinks per week. He reports that he does not use drugs.  Current Outpatient Medications on File Prior to Visit  Medication Sig Dispense Refill   atorvastatin (LIPITOR) 40 MG tablet Take 1 tablet (40 mg total) by mouth daily. TAKE 1 TABLET 2 TIMES A WEEK (Patient taking differently: Take 40 mg by mouth daily.) 90 tablet 1   Blood Glucose Monitoring Suppl (ONETOUCH VERIO REFLECT) w/Device KIT See admin instructions.     Blood Pressure Monitoring ([facility] BLOOD PRESSURE MONITOR) KIT DX I10 Monitor BP daily & as needed 1 kit 0   cyanocobalamin 1000 MCG tablet Take 1,000 mcg by mouth daily.     glipiZIDE (GLUCOTROL) 10 MG tablet Take 1 tablet (10 mg total) by mouth daily before breakfast. 90 tablet 3   lisinopril (ZESTRIL) 40 MG tablet Take 1 tablet (40 mg total) by mouth daily. 90 tablet 1   MAGNESIUM PO Take 420 mg by mouth.     metoprolol succinate (TOPROL-XL) 50 MG 24 hr tablet Take 1 tablet (50 mg total) by mouth daily. Take   with or immediately following a meal. 90 tablet 3   Multiple Vitamins-Minerals (CENTRUM SILVER PO) Take 1 capsule by mouth daily.     nitroGLYCERIN (NITROSTAT) 0.4 MG SL tablet DISSOLVE 1 TABLET UNDER TONGUE AS NEEDED FOR CHEST PAIN 25 tablet prn   OneTouch Delica  Lancets 33G MISC TEST 3 TIME DAILY Dx E11.40 300 each 3   ONETOUCH VERIO test strip TEST 3 TIME DAILY Dx E11.40 300 strip 3   rivaroxaban (XARELTO) 20 MG TABS tablet TAKE 1 TABLET (20 MG TOTAL) BY MOUTH DAILY WITH SUPPER. 90 tablet 1   tamsulosin (FLOMAX) 0.4 MG CAPS capsule Take 2 capsules (0.8 mg total) by mouth daily after supper. TAKE 1 CAPSULE BY MOUTH EVERYDAY AT BEDTIME 180 capsule 1   No current facility-administered medications on file prior to visit.    ROS Review of Systems  Constitutional: Negative.   HENT: Negative.    Eyes:  Negative for visual disturbance.  Respiratory:  Negative for cough and shortness of breath.   Cardiovascular:  Negative for chest pain and leg swelling.  Gastrointestinal:  Negative for abdominal pain, diarrhea, nausea and vomiting.  Genitourinary:  Negative for difficulty urinating.  Musculoskeletal:  Negative for arthralgias and myalgias.  Skin:  Negative for rash.  Neurological:  Negative for headaches.  Psychiatric/Behavioral:  Negative for sleep disturbance.    Objective:  Ht 5' 11" (1.803 m)   BMI 31.38 kg/m   BP Readings from Last 3 Encounters:  03/15/21 118/60  03/13/21 (!) 160/65  12/08/20 117/69    Wt Readings from Last 3 Encounters:  04/19/21 225 lb (102.1 kg)  03/15/21 229 lb (103.9 kg)  03/13/21 233 lb (105.7 kg)     Physical Exam Constitutional:      General: He is not in acute distress.    Appearance: He is well-developed.  HENT:     Head: Normocephalic and atraumatic.     Right Ear: External ear normal.     Left Ear: External ear normal.     Nose: Nose normal.  Eyes:     Conjunctiva/sclera: Conjunctivae normal.     Pupils: Pupils are equal, round, and reactive to light.  Cardiovascular:     Rate and Rhythm: Normal rate and regular rhythm.     Heart sounds: Normal heart sounds. No murmur heard. Pulmonary:     Effort: Pulmonary effort is normal. No respiratory distress.     Breath sounds: Normal breath sounds.  No wheezing or rales.  Abdominal:     Palpations: Abdomen is soft.     Tenderness: There is no abdominal tenderness.  Musculoskeletal:        General: Normal range of motion.     Cervical back: Normal range of motion and neck supple.  Skin:    General: Skin is warm and dry.  Neurological:     Mental Status: He is alert and oriented to person, place, and time.     Deep Tendon Reflexes: Reflexes are normal and symmetric.  Psychiatric:        Behavior: Behavior normal.        Thought Content: Thought content normal.        Judgment: Judgment normal.    Diabetic Foot Exam - Simple   No data filed       Assessment & Plan:   [name] was seen today for medical management of chronic issues.  Diagnoses and all orders for this visit:  Type 2 diabetes mellitus with diabetic neuropathy, without long-term current   use of insulin (HCC) -     Bayer DCA Hb A1c Waived -     CBC with Differential/Platelet  Essential hypertension -     CMP14+EGFR  Mixed hyperlipidemia -     Lipid panel  Prostate cancer screening -     PSA, total and free  Benign prostatic hyperplasia with urinary frequency   I am having [name] maintain his Multiple Vitamins-Minerals (CENTRUM SILVER PO), cyanocobalamin, OneTouch Verio Reflect, MAGNESIUM PO, OneTouch Delica Lancets 33G, atorvastatin, lisinopril, nitroGLYCERIN, rivaroxaban, tamsulosin, [facility] Blood Pressure Monitor, OneTouch Verio, glipiZIDE, and metoprolol succinate.  No orders of the defined types were placed in this encounter.    Follow-up: No follow-ups on file.  Warren Stacks, M.D.  

## 2021-06-13 LAB — CMP14+EGFR
ALT: 14 IU/L (ref 0–44)
AST: 20 IU/L (ref 0–40)
Albumin/Globulin Ratio: 1.9 (ref 1.2–2.2)
Albumin: 4.2 g/dL (ref 3.7–4.7)
Alkaline Phosphatase: 86 IU/L (ref 44–121)
BUN/Creatinine Ratio: 14 (ref 10–24)
BUN: 15 mg/dL (ref 8–27)
Bilirubin Total: 0.5 mg/dL (ref 0.0–1.2)
CO2: 25 mmol/L (ref 20–29)
Calcium: 9.2 mg/dL (ref 8.6–10.2)
Chloride: 103 mmol/L (ref 96–106)
Creatinine, Ser: 1.05 mg/dL (ref 0.76–1.27)
Globulin, Total: 2.2 g/dL (ref 1.5–4.5)
Glucose: 212 mg/dL — ABNORMAL HIGH (ref 70–99)
Potassium: 4.5 mmol/L (ref 3.5–5.2)
Sodium: 138 mmol/L (ref 134–144)
Total Protein: 6.4 g/dL (ref 6.0–8.5)
eGFR: 72 mL/min/{1.73_m2} (ref >59)

## 2021-06-13 LAB — PSA, TOTAL AND FREE
PSA, Free Pct: 30.7 %
PSA, Free: 0.43 ng/mL
Prostate Specific Ag, Serum: 1.4 ng/mL (ref 0.0–4.0)

## 2021-06-13 LAB — CBC WITH DIFFERENTIAL/PLATELET
Basophils Absolute: 0.1 10*3/uL (ref 0.0–0.2)
Basos: 1 %
EOS (ABSOLUTE): 0.1 10*3/uL (ref 0.0–0.4)
Eos: 2 %
Hematocrit: 32.1 % — ABNORMAL LOW (ref 37.5–51.0)
Hemoglobin: 10.8 g/dL — ABNORMAL LOW (ref 13.0–17.7)
Immature Grans (Abs): 0 10*3/uL (ref 0.0–0.1)
Immature Granulocytes: 0 %
Lymphocytes Absolute: 0.7 10*3/uL (ref 0.7–3.1)
Lymphs: 18 %
MCH: 29.6 pg (ref 26.6–33.0)
MCHC: 33.6 g/dL (ref 31.5–35.7)
MCV: 88 fL (ref 79–97)
Monocytes Absolute: 0.3 10*3/uL (ref 0.1–0.9)
Monocytes: 8 %
Neutrophils Absolute: 2.8 10*3/uL (ref 1.4–7.0)
Neutrophils: 71 %
Platelets: 69 10*3/uL — CL (ref 150–450)
RBC: 3.65 x10E6/uL — ABNORMAL LOW (ref 4.14–5.80)
RDW: 13.4 % (ref 11.6–15.4)
WBC: 3.9 10*3/uL (ref 3.4–10.8)

## 2021-06-13 LAB — LIPID PANEL
Chol/HDL Ratio: 2.3 ratio (ref 0.0–5.0)
Cholesterol, Total: 93 mg/dL — ABNORMAL LOW (ref 100–199)
HDL: 40 mg/dL (ref >39)
LDL Chol Calc (NIH): 41 mg/dL (ref 0–99)
Triglycerides: 47 mg/dL (ref 0–149)
VLDL Cholesterol Cal: 12 mg/dL (ref 5–40)

## 2021-06-13 NOTE — Progress Notes (Signed)
Hello Volney,  Your lab result is normal and/or stable.Some minor variations that are not significant are commonly marked abnormal, but do not represent any medical problem for you.  Best regards, Omarius Grantham, M.D.

## 2021-06-27 ENCOUNTER — Telehealth: Payer: Self-pay | Admitting: Family Medicine

## 2021-06-27 NOTE — Telephone Encounter (Signed)
Patient aware.

## 2021-06-27 NOTE — Telephone Encounter (Signed)
Start back one day after the procedure.

## 2021-08-02 IMAGING — DX DG BONE SURVEY MET
8 of 10 series · 8 of 10 positions shown · non-contrast
Comparison: Chest radiograph 06/18/2010

CLINICAL DATA: Monoclonal gammopathy of undetermined significance
(MGUS)

EXAM:
METASTATIC BONE SURVEY

[skull lat]
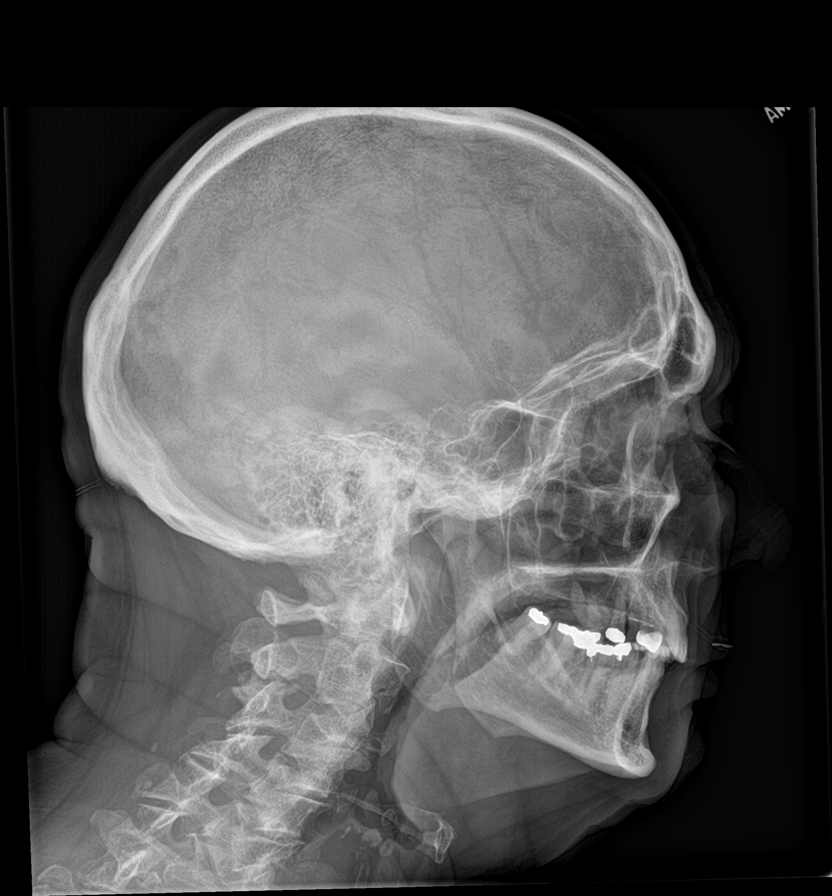

[shoulder ap (1 of 2)]
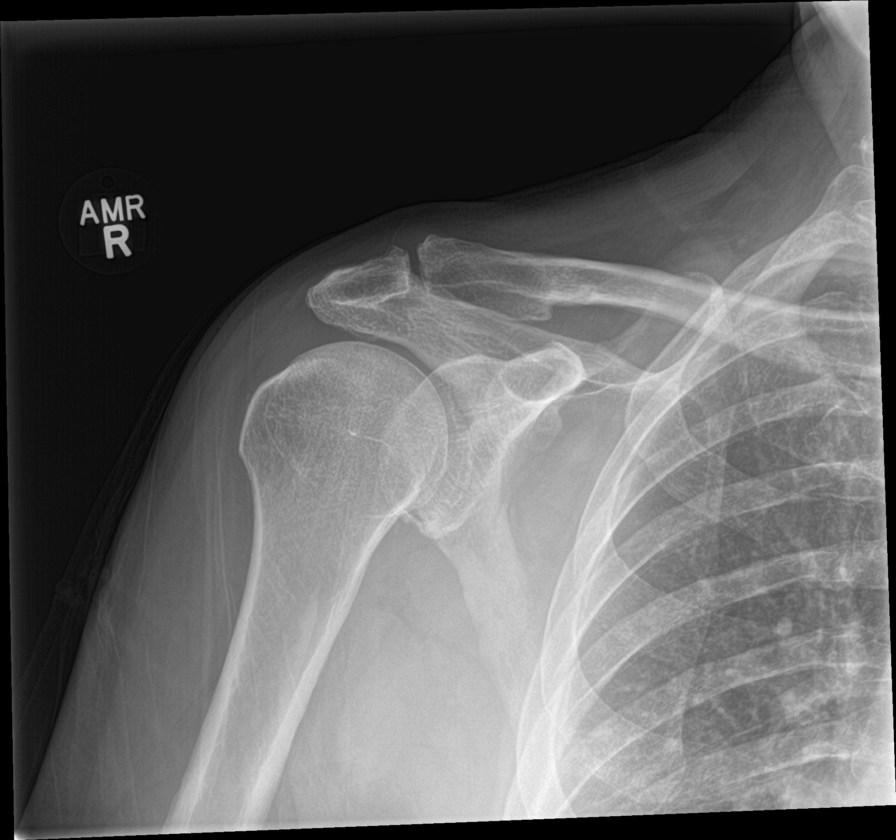

[shoulder ap (2 of 2)]
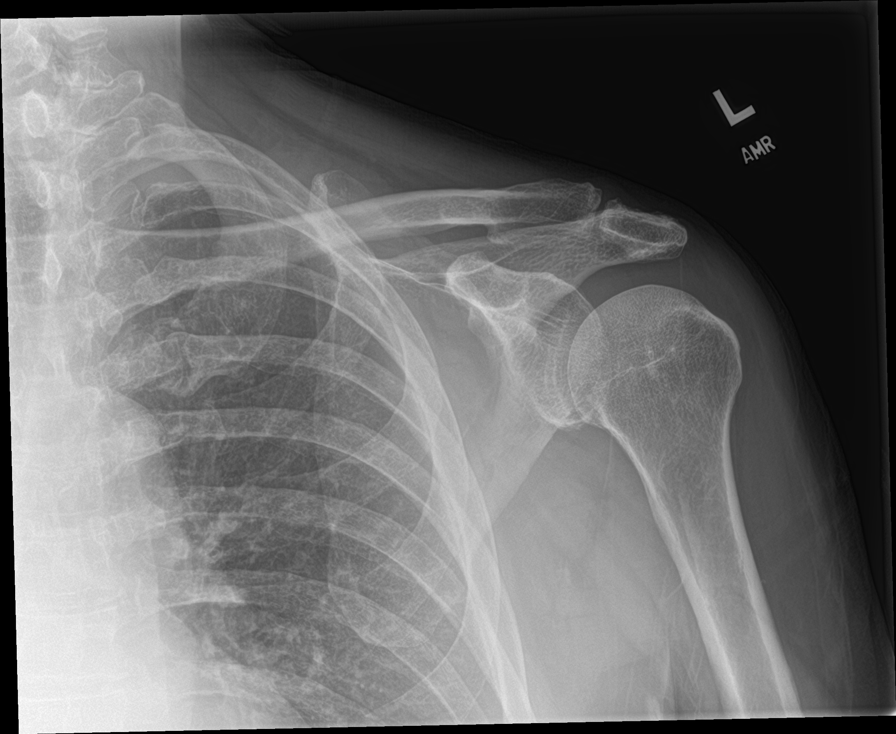

[humerus ap (1 of 2)]
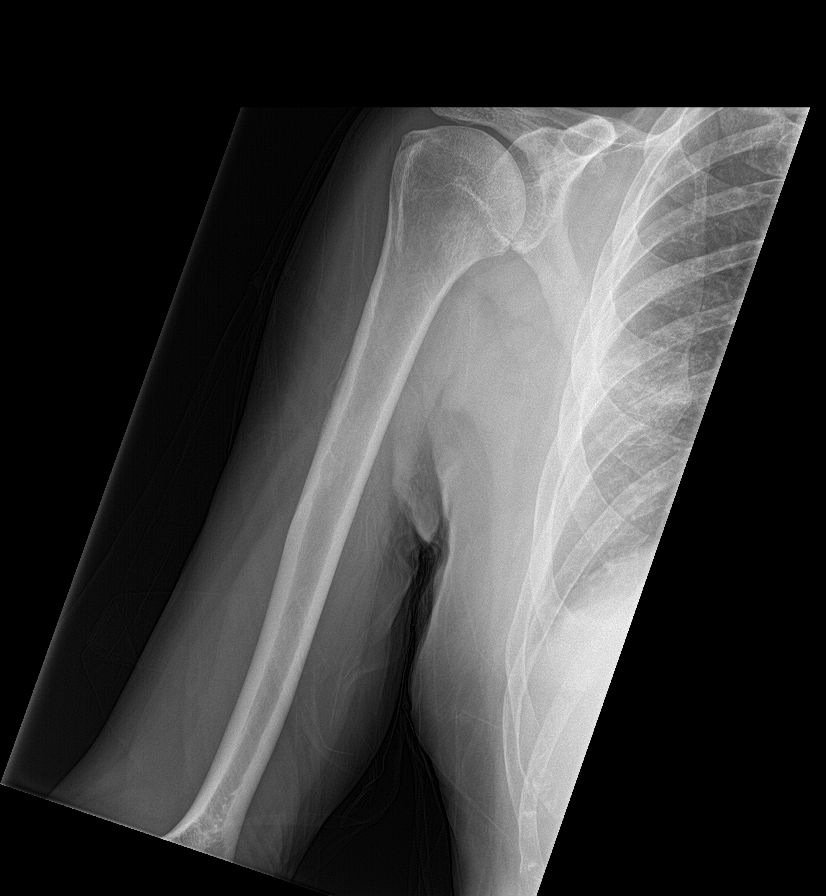

[humerus ap (2 of 2)]
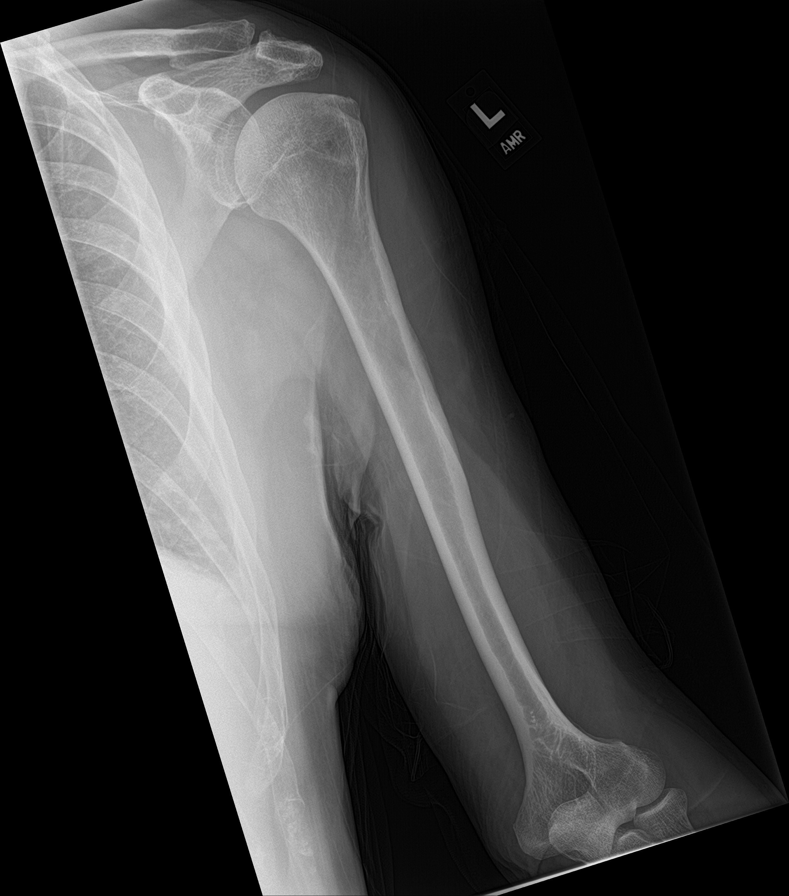

[forearm ap (1 of 2)]
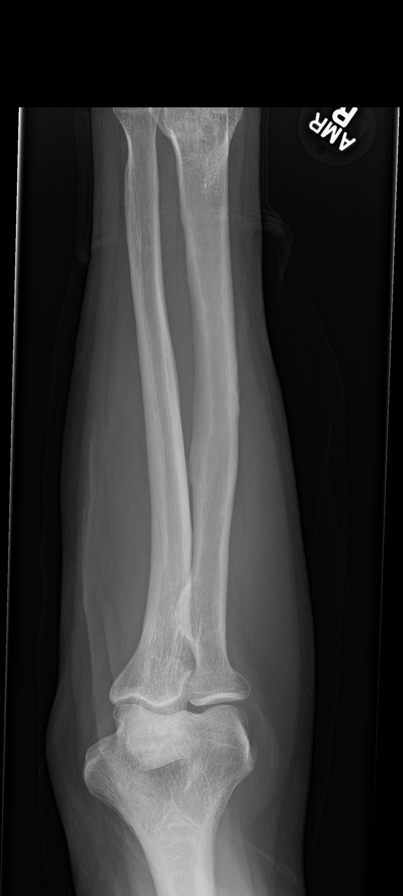

[forearm ap (2 of 2)]
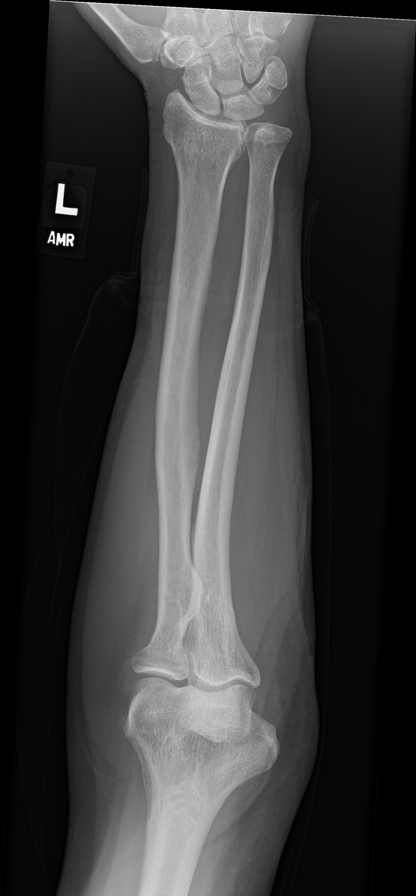

[c-spine ap]
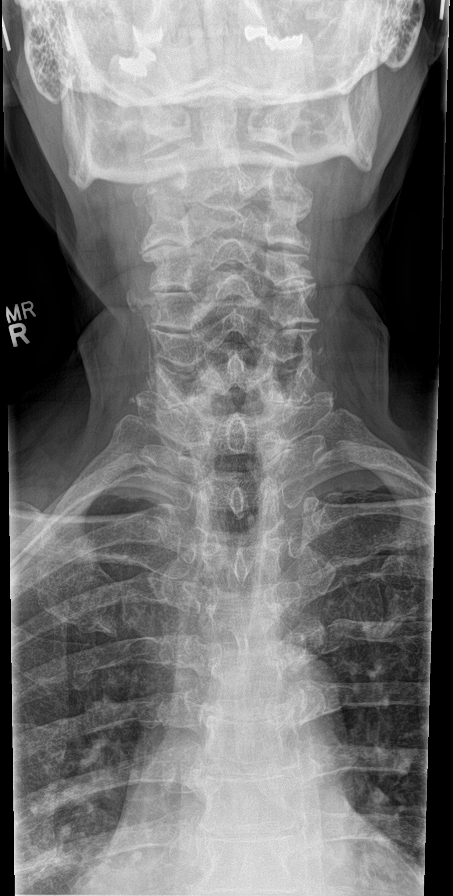

[8 of 10 positions shown; findings below may reference images not displayed]

FINDINGS: Lateral view skull: Question a solitary punched-out lytic focus
towards the anterior parietal bones.

AP and lateral C-spine: Multilevel cervical spondylitic changes and
minimal facet hypertrophic change. No acute osseous abnormality. No
suspicious osseous lesions.

AP and lateral T-spine: Multilevel discogenic and facet degenerative
changes with several bridging anterior osteophytes. No acute osseous
abnormality. No suspicious osseous lesions.

AP and lateral L-spine: 5 lumbar type vertebral bodies with
discogenic and facet degenerative change. Rounded eggshell
calcification projecting over the upper abdomen on lateral
radiograph, could reflect a splenic artery aneurysm.

AP view right shoulder: Minimal glenohumeral and acromioclavicular
arthrosis. No acute osseous abnormality. No suspicious osseous
lesions.

AP view right humerus: No acute osseous abnormality. No suspicious
osseous lesions.

AP view right forearm: Degenerative radiocarpal changes and
corticated ossification in the triangular fibrocartilage possibly
reflecting accessory ossicle or posttraumatic change. No acute
osseous abnormality. No suspicious osseous lesions.

AP view left shoulder: Glenohumeral and acromioclavicular arthrosis.
No acute osseous abnormality. No suspicious osseous lesions.

AP view left humerus: No acute osseous abnormality. No suspicious
osseous lesions.

AP view left forearm: Mild radiocarpal arthrosis. No acute osseous
abnormality. No suspicious osseous lesions.

PA chest: Coarsened interstitial changes with a basilar
predominance. No consolidation, features of edema, pneumothorax, or
effusion. The cardiomediastinal contours are unremarkable.

AP pelvis: Degenerative changes at the lumbosacral junction, SI
joints, both hips and symphysis pubis. Phleboliths in the pelvis. No
acute osseous abnormality. No suspicious osseous lesions.

AP view right femur: Well-defined sclerotic focus in the proximal
left femur, likely benign enostosis. No acute osseous abnormality.
No suspicious osseous lesions.

AP view right tibia/fibula: Soft tissue edema. Degenerative changes
at the knee and ankle. Indeterminate cortically based lucency in the
mid shaft fibula. No acute osseous abnormality. No other suspicious
osseous lesions.

AP view left femur: No acute osseous abnormality. No suspicious
osseous lesions.

AP view left tibia/fibula: Mild degenerative changes at the knee and
ankle. Lower leg soft tissue edema. No acute osseous abnormality. No
suspicious osseous lesions.
IMPRESSION: 1. Question solitary punched-out lytic focus towards the anterior
parietal bones on lateral skull radiograph.
2. Additional irregular cortically based lucency in the medial
cortex the midshaft right fibula.
3. No other suspicious osseous lesions.
4. Degenerative changes as above.
5. Chronic coarsened interstitial changes in the lungs.

## 2021-08-10 DIAGNOSIS — M79676 Pain in unspecified toe(s): Secondary | ICD-10-CM | POA: Diagnosis not present

## 2021-08-10 DIAGNOSIS — L84 Corns and callosities: Secondary | ICD-10-CM | POA: Diagnosis not present

## 2021-08-10 DIAGNOSIS — E119 Type 2 diabetes mellitus without complications: Secondary | ICD-10-CM | POA: Diagnosis not present

## 2021-08-10 DIAGNOSIS — B351 Tinea unguium: Secondary | ICD-10-CM | POA: Diagnosis not present

## 2021-08-30 ENCOUNTER — Ambulatory Visit (INDEPENDENT_AMBULATORY_CARE_PROVIDER_SITE_OTHER): Payer: Medicare HMO | Admitting: Family Medicine

## 2021-08-30 ENCOUNTER — Encounter: Payer: Self-pay | Admitting: Family Medicine

## 2021-08-30 VITALS — BP 131/65 | HR 70 | Temp 98.1°F | Ht 71.0 in | Wt 235.8 lb

## 2021-08-30 DIAGNOSIS — N3943 Post-void dribbling: Secondary | ICD-10-CM | POA: Diagnosis not present

## 2021-08-30 DIAGNOSIS — N401 Enlarged prostate with lower urinary tract symptoms: Secondary | ICD-10-CM

## 2021-08-30 DIAGNOSIS — L309 Dermatitis, unspecified: Secondary | ICD-10-CM

## 2021-08-30 LAB — URINALYSIS, ROUTINE W REFLEX MICROSCOPIC
Bilirubin, UA: NEGATIVE
Glucose, UA: NEGATIVE
Ketones, UA: NEGATIVE
Leukocytes,UA: NEGATIVE
Nitrite, UA: NEGATIVE
Protein,UA: NEGATIVE
RBC, UA: NEGATIVE
Specific Gravity, UA: 1.01 (ref 1.005–1.030)
Urobilinogen, Ur: 0.2 mg/dL (ref 0.2–1.0)
pH, UA: 5.5 (ref 5.0–7.5)

## 2021-08-30 MED ORDER — METHYLPREDNISOLONE ACETATE 80 MG/ML IJ SUSP
80.0000 mg | Freq: Once | INTRAMUSCULAR | Status: AC
  Administered 2021-08-30: 80 mg via INTRAMUSCULAR

## 2021-08-30 NOTE — Progress Notes (Signed)
Assessment & Plan:  1. Dermatitis - may use OTC antihistamines to help with itching such as zyrtec or benadryl - cold compresses may also help soothe skin - advised to stop wearing this brand of incontinence brief - methylPREDNISolone acetate (DEPO-MEDROL) injection 80 mg  2. 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  Lucile Crater, NP Student  I personally was present during the history, physical exam, and medical decision-making activities of this service and have verified that the service and findings are accurately documented in the nurse practitioner student's note.  Hendricks Limes, MSN, APRN, FNP-C [facility]  Subjective:   Patient ID: [name], male    DOB: [birth year], 81 y.o.   MRN: [id number]  HPI: [name] is a 81 y.o. male presenting on 08/30/2021 for Rash (Patient states he noticed a rash this morning from his groin area up. )   Patient states he has a rash in his groin, lower abdomen, and scattered spots up to his underarms and upper back that started yesterday. He reports that the only new thing he has been wearing is depends due to overflow incontinence and dribbling after voiding, which he has been wearing only 5 days. Prior to this he states he wore regular underwear with a pad, which did not cause this reaction. He takes his medications as prescribed and is not aware of any other changes to his diet or laundry detergent or soap.    ROS: Negative unless specifically indicated above in HPI.   Relevant past medical history reviewed and updated as indicated.   Allergies and medications reviewed and updated.   Current Outpatient Medications:    atorvastatin (LIPITOR) 40 MG  tablet, Take 1 tablet (40 mg total) by mouth daily. TAKE 1 TABLET 2 TIMES A WEEK (Patient taking differently: Take 40 mg by mouth daily.), Disp: 90 tablet, Rfl: 1   Blood Glucose Monitoring Suppl (ONETOUCH VERIO REFLECT) w/Device KIT, See admin instructions., Disp: , Rfl:    Blood Pressure Monitoring ([facility]S BLOOD PRESSURE MONITOR) KIT, DX I10 Monitor BP daily & as needed, Disp: 1 kit, Rfl: 0   cyanocobalamin 1000 MCG tablet, Take 1,000 mcg by mouth daily., Disp: , Rfl:    glipiZIDE (GLUCOTROL) 10 MG tablet, Take 1 tablet (10 mg total) by mouth daily before breakfast., Disp: 90 tablet, Rfl: 3   lisinopril (ZESTRIL) 40 MG tablet, Take 1 tablet (40 mg total) by mouth daily., Disp: 90 tablet, Rfl: 1   MAGNESIUM PO, Take 420 mg by mouth., Disp: , Rfl:    metoprolol succinate (TOPROL-XL) 50 MG 24 hr tablet, Take 1 tablet (50 mg total) by mouth daily. Take with or immediately following a meal., Disp: 90 tablet, Rfl: 3   Multiple Vitamins-Minerals (CENTRUM SILVER PO), Take 1 capsule by mouth daily., Disp: , Rfl:    nitroGLYCERIN (NITROSTAT) 0.4 MG SL tablet, DISSOLVE 1 TABLET UNDER TONGUE AS NEEDED FOR CHEST PAIN, Disp: 25 tablet, Rfl: prn   OneTouch Delica Lancets 36O MISC, TEST 3 TIME DAILY Dx E11.40, Disp: 300 each, Rfl: 3   ONETOUCH VERIO test strip, TEST 3 TIME DAILY Dx E11.40, Disp: 300 strip, Rfl: 3   rivaroxaban (XARELTO) 20 MG TABS tablet, TAKE 1 TABLET (20 MG TOTAL) BY MOUTH  DAILY WITH SUPPER., Disp: 90 tablet, Rfl: 1   tamsulosin (FLOMAX) 0.4 MG CAPS capsule, Take 2 capsules (0.8 mg total) by mouth daily after supper. TAKE 1 CAPSULE BY MOUTH EVERYDAY AT BEDTIME, Disp: 180 capsule, Rfl: 1  No Known Allergies  Objective:   BP 131/65    Pulse 70    Temp 98.1 F (36.7 C) (Temporal)    Ht _0  (1.803 m)    Wt 107 kg    BMI 32.89 kg/m    Physical Exam Vitals reviewed.  Constitutional:      General: He is not in acute distress.    Appearance: Normal appearance. He is not ill-appearing,  toxic-appearing or diaphoretic.  HENT:     Head: Normocephalic and atraumatic.  Eyes:     General: No scleral icterus.       Right eye: No discharge.        Left eye: No discharge.     Conjunctiva/sclera: Conjunctivae normal.  Cardiovascular:     Rate and Rhythm: Normal rate and regular rhythm.     Heart sounds: Normal heart sounds. No murmur heard.   No friction rub. No gallop.  Pulmonary:     Effort: Pulmonary effort is normal. No respiratory distress.     Breath sounds: Normal breath sounds. No stridor. No wheezing, rhonchi or rales.  Musculoskeletal:        General: Normal range of motion.     Cervical back: Normal range of motion.     Right lower leg: No edema.     Left lower leg: No edema.  Skin:    General: Skin is warm and dry.     Findings: Rash present. Rash is urticarial (lower abdomen, groin, upper thighs, and low back.).  Neurological:     Mental Status: He is alert and oriented to person, place, and time. Mental status is at baseline.  Psychiatric:        Mood and Affect: Mood normal.        Behavior: Behavior normal.        Thought Content: Thought content normal.        Judgment: Judgment normal.

## 2021-09-12 ENCOUNTER — Ambulatory Visit: Payer: Medicare HMO | Admitting: Family Medicine

## 2021-09-13 DIAGNOSIS — G4733 Obstructive sleep apnea (adult) (pediatric): Secondary | ICD-10-CM | POA: Diagnosis not present

## 2021-09-18 ENCOUNTER — Telehealth: Payer: Self-pay | Admitting: Family Medicine

## 2021-09-18 NOTE — Telephone Encounter (Signed)
Spoke with patient, according to Dr. Livia Snellen office visit notes from November 2021, the [place] had taken patient off of Metformin.  The patient said sometimes when his blood sugar is running high he will take one at night.  I advised him to contact the [place] and speak with them to see if they want to see him or recommend he come here.

## 2021-09-18 NOTE — Telephone Encounter (Signed)
°  Prescription Request  09/18/2021  Is this a "Controlled Substance" medicine? yes  Have you seen your PCP in the last 2 weeks? no  If YES, route message to pool  -  If NO, patient needs to be scheduled for appointment.  What is the name of the medication or equipment? Metformin  Have you contacted your pharmacy to request a refill? no   Which pharmacy would you like this sent to? [facility]   Patient notified that their request is being sent to the clinical staff for review and that they should receive a response within 2 business days.    [name]' pt.  He has an appt on 09-28-2021 w/[name].

## 2021-09-28 ENCOUNTER — Ambulatory Visit (INDEPENDENT_AMBULATORY_CARE_PROVIDER_SITE_OTHER): Payer: Medicare HMO | Admitting: Family Medicine

## 2021-09-28 ENCOUNTER — Encounter: Payer: Self-pay | Admitting: Family Medicine

## 2021-09-28 VITALS — BP 128/60 | HR 70 | Temp 98.3°F | Ht 71.0 in | Wt 226.2 lb

## 2021-09-28 DIAGNOSIS — I4891 Unspecified atrial fibrillation: Secondary | ICD-10-CM

## 2021-09-28 DIAGNOSIS — I10 Essential (primary) hypertension: Secondary | ICD-10-CM | POA: Diagnosis not present

## 2021-09-28 DIAGNOSIS — N401 Enlarged prostate with lower urinary tract symptoms: Secondary | ICD-10-CM | POA: Diagnosis not present

## 2021-09-28 DIAGNOSIS — E1140 Type 2 diabetes mellitus with diabetic neuropathy, unspecified: Secondary | ICD-10-CM

## 2021-09-28 DIAGNOSIS — R350 Frequency of micturition: Secondary | ICD-10-CM | POA: Diagnosis not present

## 2021-09-28 DIAGNOSIS — E782 Mixed hyperlipidemia: Secondary | ICD-10-CM

## 2021-09-28 LAB — CBC WITH DIFFERENTIAL/PLATELET

## 2021-09-28 LAB — LIPID PANEL

## 2021-09-28 LAB — BAYER DCA HB A1C WAIVED: HB A1C (BAYER DCA - WAIVED): 6.9 % — ABNORMAL HIGH (ref 4.8–5.6)

## 2021-09-28 MED ORDER — RIVAROXABAN 20 MG PO TABS: ORAL_TABLET | ORAL | 2 refills | Status: DC

## 2021-09-28 MED ORDER — ATORVASTATIN CALCIUM 40 MG PO TABS: 40.0000 mg | ORAL_TABLET | Freq: Every day | ORAL | 3 refills | Status: DC

## 2021-09-28 MED ORDER — LISINOPRIL 40 MG PO TABS: 40.0000 mg | ORAL_TABLET | Freq: Every day | ORAL | 2 refills | Status: DC

## 2021-09-28 MED ORDER — TAMSULOSIN HCL 0.4 MG PO CAPS: 0.8000 mg | ORAL_CAPSULE | Freq: Every day | ORAL | 2 refills | Status: DC

## 2021-09-28 NOTE — Progress Notes (Signed)
? ?Subjective:  ?Patient ID: [name],  ?male    DOB: [birth year]  Age: 81 y.o.  ? ? ?CC: Medical Management of Chronic Issues ? ? ?HPI ?[name] presents for  follow-up of hypertension. Patient has no history of headache chest pain or shortness of breath or recent cough. Patient also denies symptoms of TIA such as numbness weakness lateralizing. Patient denies side effects from medication. States taking it regularly. Home log reviewed shows readings  128-786 systolic  ? ?Patient also  in for follow-up of elevated cholesterol. Doing well without complaints on current medication. Denies side effects  including myalgia and arthralgia and nausea. Also in today for liver function testing. Currently no chest pain, shortness of breath or other cardiovascular related symptoms noted. ? ?Follow-up of diabetes. Patient does check blood sugar at home. Readings run between 130-150 ?Patient denies symptoms such as excessive hunger or urinary frequency, excessive hunger, nausea ?No significant hypoglycemic spells noted. ?Medications reviewed. Pt reports taking them regularly. Pt. denies complication/adverse reaction today.  ?Patient following at [facility] as well.  They have been prescribing his medicines.  They told him to quit taking his metformin.  He follow through on that.  They also took him off of his aspirin. ? 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  ? ?He has a past surgical history that includes Coronary stent placement (08/2006); Colonoscopy (9/17/20012); Polypectomy (09/17/20012); and Tonsillectomy.  ? ?His family history  includes Cancer in his mother; Healthy in his son; Heart attack in his father; Heart disease in his father; Hypertension in his mother and sister; Irritable bowel syndrome in his maternal grandmother.He reports that he quit smoking about 50 years ago. His smoking use included cigarettes. He smoked an average of 1 pack per day. He quit smokeless tobacco use about 32 years ago.  His smokeless tobacco use included chew. He reports current alcohol use of about 2.0 standard drinks per week. He reports that he does not use drugs. ? ?Current Outpatient Medications on File Prior to Visit  ?Medication Sig Dispense Refill  ? Blood Glucose Monitoring Suppl (ONETOUCH VERIO REFLECT) w/Device KIT See admin instructions.    ? Blood Pressure Monitoring (CVS BLOOD PRESSURE MONITOR) KIT DX I10 Monitor BP daily & as needed 1 kit 0  ? cyanocobalamin 1000 MCG tablet Take 1,000 mcg by mouth daily.    ? glipiZIDE (GLUCOTROL) 10 MG tablet Take 1 tablet (10 mg total) by mouth daily before breakfast. 90 tablet 3  ? MAGNESIUM PO Take 420 mg by mouth.    ? metoprolol succinate (TOPROL-XL) 50 MG 24 hr tablet Take 1 tablet (50 mg total) by mouth daily. Take with or immediately following a meal. 90 tablet 3  ? Multiple Vitamins-Minerals (CENTRUM SILVER PO) Take 1 capsule by mouth daily.    ? nitroGLYCERIN (NITROSTAT) 0.4 MG SL tablet DISSOLVE 1 TABLET UNDER TONGUE AS NEEDED FOR CHEST PAIN 25 tablet prn  ? OneTouch Delica Lancets 09G MISC TEST 3 TIME DAILY Dx E11.40 300 each 3  ? ONETOUCH VERIO test strip TEST 3 TIME DAILY Dx E11.40 300 strip 3  ? ?No current facility-administered medications on file prior to visit.  ? ? ?ROS ?Review of Systems  ?Constitutional:  Negative for  fever.  ?Respiratory:  Negative for shortness of breath.   ?Cardiovascular:  Negative for chest pain.  ?Musculoskeletal:  Negative for arthralgias.  ?Skin:  Negative for rash.  ? ?Objective:  ?BP 128/60   Pulse 70   Temp 98.3 ?F (36.8 ?C)   Ht $R'5\' 11"'an$  (1.803 m)   Wt 226 lb  3.2 oz (102.6 kg)   SpO2 95%   BMI 31.55 kg/m?  ? ?BP Readings from Last 3 Encounters:  ?09/28/21 128/60  ?08/30/21 131/65  ?06/12/21 (!) 132/56  ? ? ?Wt Readings from Last 3 Encounters:  ?09/28/21 226 lb 3.2 oz (102.6 kg)  ?08/30/21 235 lb 12.8 oz (107 kg)  ?06/12/21 235 lb 9.6 oz (106.9 kg)  ? ? ? ?Physical Exam ?Vitals reviewed.  ?Constitutional:   ?   Appearance: He is well-developed.  ?HENT:  ?   Head: Normocephalic and atraumatic.  ?   Right Ear: External ear normal.  ?   Left Ear: External ear normal.  ?   Mouth/Throat:  ?   Pharynx: No oropharyngeal exudate or posterior oropharyngeal erythema.  ?Eyes:  ?   Pupils: Pupils are equal, round, and reactive to light.  ?Cardiovascular:  ?   Rate and Rhythm: Normal rate and regular rhythm.  ?   Heart sounds: No murmur heard. ?Pulmonary:  ?   Effort: No respiratory distress.  ?   Breath sounds: Normal breath sounds.  ?Musculoskeletal:  ?   Cervical back: Normal range of motion and neck supple.  ?Neurological:  ?   Mental Status: He is alert and oriented to person, place, and time.  ? ? ?Diabetic Foot Exam - Simple   ?No data filed ?  ? ? ? ? ?Assessment & Plan:  ? ?[name] was seen today for medical management of chronic issues. ? ?Diagnoses and all orders for this visit: ? ?Type 2 diabetes mellitus with diabetic neuropathy, without long-term current use of insulin ([place]) ?-     Bayer DCA Hb A1c Waived ? ?Essential hypertension ?-     CMP14+EGFR ?-     CBC with Differential/Platelet ?-     lisinopril (ZESTRIL) 40 MG tablet; Take 1 tablet (40 mg total) by mouth daily. ? ?Mixed hyperlipidemia ?-     Lipid panel ?-     atorvastatin (LIPITOR) 40 MG tablet; Take 1 tablet (40 mg total) by mouth daily. ? ?Atrial fibrillation, unspecified type ([place]) ?-     rivaroxaban (XARELTO) 20 MG TABS tablet; TAKE 1 TABLET (20 MG TOTAL) BY MOUTH DAILY WITH SUPPER. ? ?Benign prostatic hyperplasia with urinary frequency ?-     tamsulosin (FLOMAX) 0.4 MG CAPS capsule; Take 2 capsules (0.8 mg  total) by mouth daily after supper. TAKE 1 CAPSULE BY MOUTH EVERYDAY AT BEDTIME ? ? ?I have changed Urijah G. Hurtado's atorvastatin. I am also having him maintain his Multiple Vitamins-Minerals (CENTRUM SILVER PO), cyanocobalamin, OneTouch Verio Reflect, MAGNESIUM PO, OneTouch Delica Lancets 33L, nitroGLYCERIN, CVS Blood Pressure Monitor, OneTouch Verio, glipiZIDE, metoprolol succinate, lisinopril, rivaroxaban, and tamsulosin. ? ?Meds ordered this encounter  ?Medications  ? lisinopril (ZESTRIL) 40 MG tablet  ?  Sig: Take 1 tablet (40 mg total) by mouth daily.  ?  Dispense:  90 tablet  ?  Refill:  2  ? rivaroxaban (XARELTO) 20 MG TABS tablet  ?  Sig: TAKE 1 TABLET (20 MG TOTAL) BY MOUTH DAILY WITH SUPPER.  ?  Dispense:  90 tablet  ?  Refill:  2  ? tamsulosin (FLOMAX) 0.4  MG CAPS capsule  ?  Sig: Take 2 capsules (0.8 mg total) by mouth daily after supper. TAKE 1 CAPSULE BY MOUTH EVERYDAY AT BEDTIME  ?  Dispense:  180 capsule  ?  Refill:  2  ? atorvastatin (LIPITOR) 40 MG tablet  ?  Sig: Take 1 tablet (40 mg total) by mouth daily.  ?  Dispense:  90 tablet  ?  Refill:  3  ? ? ? ?Follow-up: Return in about 3 months (around 12/29/2021). ? ?Claretta Fraise, M.D. ?

## 2021-09-29 LAB — CBC WITH DIFFERENTIAL/PLATELET
Basophils Absolute: 0 10*3/uL (ref 0.0–0.2)
Basos: 1 %
EOS (ABSOLUTE): 0.1 10*3/uL (ref 0.0–0.4)
Eos: 1 %
Hematocrit: 36.9 % — ABNORMAL LOW (ref 37.5–51.0)
Hemoglobin: 12.1 g/dL — ABNORMAL LOW (ref 13.0–17.7)
Immature Grans (Abs): 0 10*3/uL (ref 0.0–0.1)
Immature Granulocytes: 0 %
Lymphocytes Absolute: 1.1 10*3/uL (ref 0.7–3.1)
Lymphs: 26 %
MCH: 28.7 pg (ref 26.6–33.0)
MCHC: 32.8 g/dL (ref 31.5–35.7)
MCV: 87 fL (ref 79–97)
Monocytes Absolute: 0.3 10*3/uL (ref 0.1–0.9)
Monocytes: 8 %
Neutrophils Absolute: 2.8 10*3/uL (ref 1.4–7.0)
Neutrophils: 64 %
Platelets: 76 10*3/uL — CL (ref 150–450)
RBC: 4.22 x10E6/uL (ref 4.14–5.80)
RDW: 13.9 % (ref 11.6–15.4)
WBC: 4.4 10*3/uL (ref 3.4–10.8)

## 2021-09-29 LAB — CMP14+EGFR
ALT: 17 IU/L (ref 0–44)
AST: 16 IU/L (ref 0–40)
Albumin/Globulin Ratio: 2 (ref 1.2–2.2)
Albumin: 4.6 g/dL (ref 3.7–4.7)
Alkaline Phosphatase: 83 IU/L (ref 44–121)
BUN/Creatinine Ratio: 15 (ref 10–24)
BUN: 17 mg/dL (ref 8–27)
Bilirubin Total: 0.6 mg/dL (ref 0.0–1.2)
CO2: 25 mmol/L (ref 20–29)
Calcium: 9.9 mg/dL (ref 8.6–10.2)
Chloride: 104 mmol/L (ref 96–106)
Creatinine, Ser: 1.17 mg/dL (ref 0.76–1.27)
Globulin, Total: 2.3 g/dL (ref 1.5–4.5)
Glucose: 125 mg/dL — ABNORMAL HIGH (ref 70–99)
Potassium: 4.7 mmol/L (ref 3.5–5.2)
Sodium: 141 mmol/L (ref 134–144)
Total Protein: 6.9 g/dL (ref 6.0–8.5)
eGFR: 63 mL/min/{1.73_m2} (ref >59)

## 2021-09-29 LAB — LIPID PANEL
Chol/HDL Ratio: 2.2 ratio (ref 0.0–5.0)
Cholesterol, Total: 93 mg/dL — ABNORMAL LOW (ref 100–199)
HDL: 42 mg/dL (ref >39)
LDL Chol Calc (NIH): 39 mg/dL (ref 0–99)
Triglycerides: 49 mg/dL (ref 0–149)
VLDL Cholesterol Cal: 12 mg/dL (ref 5–40)

## 2021-10-01 NOTE — Progress Notes (Signed)
Hello Devion,  Your lab result is normal and/or stable.Some minor variations that are not significant are commonly marked abnormal, but do not represent any medical problem for you.  Best regards, Ashe Graybeal, M.D.

## 2021-11-09 DIAGNOSIS — L84 Corns and callosities: Secondary | ICD-10-CM | POA: Diagnosis not present

## 2021-11-09 DIAGNOSIS — M79676 Pain in unspecified toe(s): Secondary | ICD-10-CM | POA: Diagnosis not present

## 2021-11-09 DIAGNOSIS — E1142 Type 2 diabetes mellitus with diabetic polyneuropathy: Secondary | ICD-10-CM | POA: Diagnosis not present

## 2021-11-09 DIAGNOSIS — B351 Tinea unguium: Secondary | ICD-10-CM | POA: Diagnosis not present

## 2021-11-29 DIAGNOSIS — G4733 Obstructive sleep apnea (adult) (pediatric): Secondary | ICD-10-CM | POA: Diagnosis not present

## 2022-01-01 ENCOUNTER — Encounter: Payer: Self-pay | Admitting: Family Medicine

## 2022-01-01 ENCOUNTER — Ambulatory Visit (INDEPENDENT_AMBULATORY_CARE_PROVIDER_SITE_OTHER): Payer: Medicare HMO | Admitting: Family Medicine

## 2022-01-01 VITALS — BP 153/63 | HR 66 | Temp 98.3°F | Ht 71.0 in | Wt 235.8 lb

## 2022-01-01 DIAGNOSIS — E782 Mixed hyperlipidemia: Secondary | ICD-10-CM | POA: Diagnosis not present

## 2022-01-01 DIAGNOSIS — E1140 Type 2 diabetes mellitus with diabetic neuropathy, unspecified: Secondary | ICD-10-CM

## 2022-01-01 DIAGNOSIS — I10 Essential (primary) hypertension: Secondary | ICD-10-CM | POA: Diagnosis not present

## 2022-01-01 DIAGNOSIS — E119 Type 2 diabetes mellitus without complications: Secondary | ICD-10-CM | POA: Diagnosis not present

## 2022-01-01 LAB — BAYER DCA HB A1C WAIVED: HB A1C (BAYER DCA - WAIVED): 6.3 % — ABNORMAL HIGH (ref 4.8–5.6)

## 2022-01-01 MED ORDER — GLIPIZIDE 10 MG PO TABS: 10.0000 mg | ORAL_TABLET | Freq: Every day | ORAL | 3 refills | Status: DC

## 2022-01-01 MED ORDER — METOPROLOL SUCCINATE ER 50 MG PO TB24: 50.0000 mg | ORAL_TABLET | Freq: Every day | ORAL | 3 refills | Status: DC

## 2022-01-01 NOTE — Progress Notes (Signed)
Subjective:  Patient ID: [name],  male    DOB: 06-27-1941  Age: 81 y.o.    CC: Medical Management of Chronic Issues   HPI [name] presents for  follow-up of hypertension. Patient has no history of headache chest pain or shortness of breath or recent cough. Patient also denies symptoms of TIA such as numbness weakness lateralizing. Patient denies side effects from medication. States taking it regularly. Home readings done daily usually high 027X to 412 systolic.   Patient also  in for follow-up of elevated cholesterol. Doing well without complaints on current medication. Denies side effects  including myalgia and arthralgia and nausea. Also in today for liver function testing. Currently no chest pain, shortness of breath or other cardiovascular related symptoms noted.  Follow-up of diabetes. Patient does check blood sugar at home. Readings run between 120 and 130 Patient denies symptoms such as excessive hunger or urinary frequency, excessive hunger, nausea No significant hypoglycemic spells noted. Medications reviewed. Pt reports taking them regularly. Pt. denies complication/adverse reaction today.    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   [name] has a past surgical history that includes Coronary stent placement (08/2006); Colonoscopy (9/17/20012); Polypectomy (09/17/20012); and Tonsillectomy.   His family history includes Cancer in his mother; Healthy in his son; Heart attack in his father; Heart disease in his father; Hypertension in his mother and sister; Irritable bowel syndrome in  his maternal grandmother.[name] reports that [name] quit smoking about 50 years ago. His smoking use included cigarettes. [name] smoked an average of 1 pack per day. [name] quit smokeless tobacco use about 32 years ago.  His smokeless tobacco use included chew. [name] reports current alcohol use of about 2.0 standard drinks per week. [name] reports that [name] does not use drugs.  Current Outpatient Medications on File Prior to Visit  Medication Sig Dispense Refill   atorvastatin (LIPITOR) 40 MG tablet Take 1 tablet (40 mg total) by mouth daily. 90 tablet 3   Blood Glucose Monitoring Suppl (ONETOUCH VERIO REFLECT) w/Device KIT See admin instructions.     Blood Pressure Monitoring (CVS BLOOD PRESSURE MONITOR) KIT DX I10 Monitor BP daily & as needed 1 kit 0   cyanocobalamin 1000 MCG tablet Take 1,000 mcg by mouth daily.     lisinopril (ZESTRIL) 40 MG tablet Take 1 tablet (40 mg total) by mouth daily. 90 tablet 2   MAGNESIUM PO Take 420 mg by mouth.     Multiple Vitamins-Minerals (CENTRUM SILVER PO) Take 1 capsule by mouth daily.     nitroGLYCERIN (NITROSTAT) 0.4 MG SL tablet DISSOLVE 1 TABLET UNDER TONGUE AS NEEDED FOR CHEST PAIN 25 tablet prn   OneTouch Delica Lancets 09O MISC TEST 3 TIME DAILY Dx E11.40 300 each 3   ONETOUCH VERIO test strip TEST 3 TIME DAILY Dx E11.40 300 strip 3   rivaroxaban (XARELTO) 20 MG TABS tablet TAKE 1 TABLET (20 MG TOTAL) BY MOUTH DAILY WITH SUPPER. 90 tablet 2   tamsulosin (FLOMAX) 0.4 MG CAPS capsule Take 2 capsules (0.8 mg total) by mouth daily after supper. TAKE 1 CAPSULE BY MOUTH EVERYDAY AT BEDTIME 180 capsule 2   No current facility-administered medications on file prior to visit.    ROS  Review of Systems  Constitutional:  Negative for fever.  Respiratory:  Negative for shortness of breath.   Cardiovascular:  Negative for chest pain.  Genitourinary:  Positive for frequency (nocturia X 3. Unchanged with med.).  Musculoskeletal:  Negative for arthralgias.  Skin:  Negative for rash.    Objective:  BP (!) 153/63   Pulse 66   Temp 98.3 F (36.8 C)   Ht $R'5\' 11"'yB$  (1.803 m)   Wt 235 lb 12.8 oz (107 kg)   SpO2 94%   BMI 32.89 kg/m   BP Readings from Last 3 Encounters:  01/01/22 (!) 153/63  09/28/21 128/60  08/30/21 131/65    Wt Readings from Last 3 Encounters:  01/01/22 235 lb 12.8 oz (107 kg)  09/28/21 226 lb 3.2 oz (102.6 kg)  08/30/21 235 lb 12.8 oz (107 kg)     Physical Exam Vitals reviewed.  Constitutional:      Appearance: [name] is well-developed.  HENT:     Head: Normocephalic and atraumatic.     Right Ear: External ear normal.     Left Ear: External ear normal.     Mouth/Throat:     Pharynx: No oropharyngeal exudate or posterior oropharyngeal erythema.  Eyes:     Pupils: Pupils are equal, round, and reactive to light.  Cardiovascular:     Rate and Rhythm: Normal rate and regular rhythm.     Heart sounds: No murmur heard. Pulmonary:     Effort: No respiratory distress.     Breath sounds: Normal breath sounds.  Abdominal:     Tenderness: There is no abdominal tenderness.  Musculoskeletal:     Cervical back: Normal range of motion and neck supple.  Neurological:     Mental Status: [name] is alert and oriented to person, place, and time.    Diabetic Foot Exam - Simple   No data filed     Lab Results  Component Value Date   HGBA1C 6.9 (H) 09/28/2021   HGBA1C 6.3 (H) 06/12/2021   HGBA1C 6.3 03/13/2021    Assessment & Plan:   [name] was seen today for medical management of chronic issues.  Diagnoses and all orders for this visit:  Type 2 diabetes mellitus with diabetic neuropathy, without long-term current use of insulin ([place]) -     Bayer DCA Hb A1c Waived -     CBC with Differential/Platelet; Standing -     CMP14+EGFR; Standing -     Bayer DCA Hb A1c Waived; Standing -     Lipid panel; Standing  Essential hypertension -     CBC with Differential/Platelet -     CMP14+EGFR -     CBC with Differential/Platelet; Standing -      CMP14+EGFR; Standing -     Bayer DCA Hb A1c Waived; Standing -     Lipid panel; Standing  Mixed hyperlipidemia -     Lipid panel -     CBC with Differential/Platelet; Standing -     CMP14+EGFR; Standing -     Bayer DCA Hb A1c Waived; Standing -     Lipid panel; Standing  Diabetes mellitus without complication (H[facility]) -     glipiZIDE (GLUCOTROL) 10 MG tablet; Take 1 tablet (10 mg total) by mouth daily before breakfast.  Other orders -     metoprolol succinate (TOPROL-XL) 50 MG 24 hr tablet; Take 1 tablet (50 mg total) by mouth daily. Take with or immediately following a meal.   I am having [name]  maintain his Multiple Vitamins-Minerals (CENTRUM SILVER PO), cyanocobalamin, OneTouch Verio Reflect, MAGNESIUM PO, OneTouch Delica Lancets 21J, nitroGLYCERIN, CVS Blood Pressure Monitor, OneTouch Verio, lisinopril, rivaroxaban, tamsulosin, atorvastatin, glipiZIDE, and metoprolol succinate.  Meds ordered this encounter  Medications   glipiZIDE (GLUCOTROL) 10 MG tablet    Sig: Take 1 tablet (10 mg total) by mouth daily before breakfast.    Dispense:  90 tablet    Refill:  3   metoprolol succinate (TOPROL-XL) 50 MG 24 hr tablet    Sig: Take 1 tablet (50 mg total) by mouth daily. Take with or immediately following a meal.    Dispense:  90 tablet    Refill:  3     Follow-up: Return in about 3 months (around 04/03/2022).  Claretta Fraise, M.D.

## 2022-01-02 LAB — CMP14+EGFR
ALT: 20 IU/L (ref 0–44)
AST: 20 IU/L (ref 0–40)
Albumin/Globulin Ratio: 1.9 (ref 1.2–2.2)
Albumin: 4.3 g/dL (ref 3.7–4.7)
Alkaline Phosphatase: 87 IU/L (ref 44–121)
BUN/Creatinine Ratio: 10 (ref 10–24)
BUN: 12 mg/dL (ref 8–27)
Bilirubin Total: 0.6 mg/dL (ref 0.0–1.2)
CO2: 22 mmol/L (ref 20–29)
Calcium: 9.2 mg/dL (ref 8.6–10.2)
Chloride: 103 mmol/L (ref 96–106)
Creatinine, Ser: 1.16 mg/dL (ref 0.76–1.27)
Globulin, Total: 2.3 g/dL (ref 1.5–4.5)
Glucose: 184 mg/dL — ABNORMAL HIGH (ref 70–99)
Potassium: 4.2 mmol/L (ref 3.5–5.2)
Sodium: 139 mmol/L (ref 134–144)
Total Protein: 6.6 g/dL (ref 6.0–8.5)
eGFR: 64 mL/min/{1.73_m2} (ref >59)

## 2022-01-02 LAB — CBC WITH DIFFERENTIAL/PLATELET
Basophils Absolute: 0 10*3/uL (ref 0.0–0.2)
Basos: 1 %
EOS (ABSOLUTE): 0.1 10*3/uL (ref 0.0–0.4)
Eos: 2 %
Hematocrit: 33.6 % — ABNORMAL LOW (ref 37.5–51.0)
Hemoglobin: 11.6 g/dL — ABNORMAL LOW (ref 13.0–17.7)
Immature Grans (Abs): 0 10*3/uL (ref 0.0–0.1)
Immature Granulocytes: 0 %
Lymphocytes Absolute: 0.8 10*3/uL (ref 0.7–3.1)
Lymphs: 22 %
MCH: 30.8 pg (ref 26.6–33.0)
MCHC: 34.5 g/dL (ref 31.5–35.7)
MCV: 89 fL (ref 79–97)
Monocytes Absolute: 0.3 10*3/uL (ref 0.1–0.9)
Monocytes: 9 %
Neutrophils Absolute: 2.3 10*3/uL (ref 1.4–7.0)
Neutrophils: 66 %
Platelets: 58 10*3/uL — CL (ref 150–450)
RBC: 3.77 x10E6/uL — ABNORMAL LOW (ref 4.14–5.80)
RDW: 13.7 % (ref 11.6–15.4)
WBC: 3.5 10*3/uL (ref 3.4–10.8)

## 2022-01-02 LAB — LIPID PANEL
Chol/HDL Ratio: 2.3 ratio (ref 0.0–5.0)
Cholesterol, Total: 98 mg/dL — ABNORMAL LOW (ref 100–199)
HDL: 42 mg/dL (ref >39)
LDL Chol Calc (NIH): 41 mg/dL (ref 0–99)
Triglycerides: 67 mg/dL (ref 0–149)
VLDL Cholesterol Cal: 15 mg/dL (ref 5–40)

## 2022-03-05 ENCOUNTER — Other Ambulatory Visit: Payer: Self-pay | Admitting: Family Medicine

## 2022-03-05 ENCOUNTER — Telehealth: Payer: Self-pay | Admitting: Family Medicine

## 2022-03-05 NOTE — Telephone Encounter (Signed)
Unable to dial home# , NA/NVM on mobile# refill done via Rx RF request from pharmacy today.

## 2022-03-05 NOTE — Telephone Encounter (Signed)
  Prescription Request  03/05/2022  Is this a "Controlled Substance" medicine? NO  Have you seen your PCP in the last 2 weeks? No  If YES, route message to pool  -  If NO, patient needs to be scheduled for appointment.  What is the name of the medication or equipment? One Touch Lancet  Have you contacted your pharmacy to request a refill? YES   Which pharmacy would you like this sent to? [place]   Patient notified that their request is being sent to the clinical staff for review and that they should receive a response within 2 business days.

## 2022-03-23 DIAGNOSIS — G4733 Obstructive sleep apnea (adult) (pediatric): Secondary | ICD-10-CM | POA: Diagnosis not present

## 2022-03-27 NOTE — Progress Notes (Unsigned)
Cardiology Office Note   Date:  03/28/2022   ID:  Jared, Ward [birth year], MRN [id number]  PCP:  Claretta Fraise, MD  Cardiologist:   Minus Breeding, MD  Referring:  Particia Nearing   Chief Complaint  Patient presents with   Coronary Artery Disease      History of Present Illness: [name] is a 81 y.o. male who presents for follow up of CAD. He had a DES to the LAD in 2/08. Repeat cath in 11/11 showed patent stent.  Echo in 2/16 showed EF 55-60% he did have some mild mitral regurgitation.  He returns for follow up.  Since I last saw him he has done well.  The patient denies any new symptoms such as chest discomfort, neck or arm discomfort. There has been no new shortness of breath, PND or orthopnea. There have been no reported palpitations, presyncope or syncope.  He gets around slowly with a walking stick because of some balance issues joint problems.     Past Medical History:  Diagnosis Date   Coronary atherosclerosis of unspecified type of vessel, native or graft    Diabetes ([place])    Diverticulosis    Educated about COVID-19 virus infection 12/17/2018   ETD (eustachian tube dysfunction) 07/07/2013   Hepatomegaly    Hx of colonic polyps    Metabolic syndrome 50/03/3817   Obesity, unspecified    Obstructive sleep apnea    Uses CPAP.    Other and unspecified hyperlipidemia    Unspecified essential hypertension     Past Surgical History:  Procedure Laterality Date   COLONOSCOPY  9/17/20012   CORONARY STENT PLACEMENT  08/2006   LAD   POLYPECTOMY  09/17/20012   TONSILLECTOMY       Current Outpatient Medications  Medication Sig Dispense Refill   atorvastatin (LIPITOR) 40 MG tablet Take 1 tablet (40 mg total) by mouth daily. 90 tablet 3   cyanocobalamin 1000 MCG tablet Take 1,000 mcg by mouth daily.     glipiZIDE (GLUCOTROL) 10 MG tablet Take 1 tablet (10 mg total) by mouth daily before breakfast. 90 tablet 3   lisinopril-hydrochlorothiazide (ZESTORETIC)  20-12.5 MG tablet Take 2 tablets by mouth daily. 180 tablet 3   MAGNESIUM PO Take 420 mg by mouth.     metoprolol succinate (TOPROL-XL) 50 MG 24 hr tablet Take 1 tablet (50 mg total) by mouth daily. Take with or immediately following a meal. 90 tablet 3   Multiple Vitamins-Minerals (CENTRUM SILVER PO) Take 1 capsule by mouth daily.     nitroGLYCERIN (NITROSTAT) 0.4 MG SL tablet DISSOLVE 1 TABLET UNDER TONGUE AS NEEDED FOR CHEST PAIN 25 tablet prn   rivaroxaban (XARELTO) 20 MG TABS tablet TAKE 1 TABLET (20 MG TOTAL) BY MOUTH DAILY WITH SUPPER. 90 tablet 2   tamsulosin (FLOMAX) 0.4 MG CAPS capsule Take 2 capsules (0.8 mg total) by mouth daily after supper. TAKE 1 CAPSULE BY MOUTH EVERYDAY AT BEDTIME 180 capsule 2   Blood Glucose Monitoring Suppl (ONETOUCH VERIO REFLECT) w/Device KIT See admin instructions.     Blood Pressure Monitoring ([facility] BLOOD PRESSURE MONITOR) KIT DX I10 Monitor BP daily & as needed 1 kit 0   Lancets (ONETOUCH DELICA PLUS [id number]) MISC TEST 3 TIME DAILY DX E11.40 300 each 3   ONETOUCH VERIO test strip TEST 3 TIME DAILY Dx E11.40 300 strip 3   No current facility-administered medications for this visit.    Allergies:   Patient has no  known allergies.    ROS:  Please see the history of present illness.   Otherwise, review of systems are positive for joint pain, back pain, neuropathy.   All other systems are reviewed and negative.    PHYSICAL EXAM: VS:  BP (!) 158/74   Pulse 68   Ht $R'5\' 11"'mx$  (1.803 m)   Wt 243 lb (110.2 kg)   BMI 33.89 kg/m  , BMI Body mass index is 33.89 kg/m.  GENERAL:  Well appearing NECK:  No jugular venous distention, waveform within normal limits, carotid upstroke brisk and symmetric, no bruits, no thyromegaly LUNGS:  Clear to auscultation bilaterally CHEST:  Unremarkable HEART:  PMI not displaced or sustained,S1 and S2 within normal limits, no S3, no S4, no clicks, no rubs, soft apical systolic murmurs, no diastolic ABD:  Flat, positive bowel  sounds normal in frequency in pitch, no bruits, no rebound, no guarding, no midline pulsatile mass, no hepatomegaly, no splenomegaly EXT:  2 plus pulses throughout, no edema, no cyanosis no clubbing   EKG:  EKG is  ordered today. The ekg demonstrates sinus rhythm, rate 68, axis within normal limits, intervals within normal limits, no acute ST-T wave changes.  Early transition lead V2   Recent Labs: 01/01/2022: ALT 20; BUN 12; Creatinine, Ser 1.16; Hemoglobin 11.6; Platelets 58; Potassium 4.2; Sodium 139    Lipid Panel    Component Value Date/Time   CHOL 98 (L) 01/01/2022 0839   CHOL 138 11/06/2012 0945   TRIG 67 01/01/2022 0839   TRIG 108 07/07/2013 0952   TRIG 98 11/06/2012 0945   HDL 42 01/01/2022 0839   HDL 45 07/07/2013 0952   HDL 42 11/06/2012 0945   CHOLHDL 2.3 01/01/2022 0839   CHOLHDL 3 09/09/2014 1451   VLDL 24.6 09/09/2014 1451   LDLCALC 41 01/01/2022 0839   LDLCALC 74 07/07/2013 0952   LDLCALC 76 11/06/2012 0945      Wt Readings from Last 3 Encounters:  03/28/22 243 lb (110.2 kg)  01/01/22 235 lb 12.8 oz (107 kg)  09/28/21 226 lb 3.2 oz (102.6 kg)      Other studies Reviewed: Additional studies/ records that were reviewed today include:   Labs Review of the above records demonstrates: See elsewhere  ASSESSMENT AND PLAN:   CAD:   The patient has no new sypmtoms.  No further cardiovascular testing is indicated.  We will continue with aggressive risk reduction and meds as listed.  DYSLIPIDEMIA:   LDL was 41 with an HDL 42.  No change in therapy.   ATRIAL FIB:     He has had no symptomatic paroxysms.  I have not documented this in a long time.  No change in therapy.   DM:   A1c is 6.3.  No change in therapy.   HTN: His blood pressure is not well controlled.  I am going to add HCTZ to his regimen.  Check to be met in 2 weeks.  He will keep a BP diary.  Current medicines are reviewed at length with the patient today.  The patient does not have concerns  regarding medicines.  The following changes have been made:    As above  Labs/ tests ordered today include:     Orders Placed This Encounter  Procedures   Basic metabolic panel      Disposition:   FU with me in 12 months   Signed, Minus Breeding, MD  03/28/2022 3:03 PM    [facility]

## 2022-03-28 ENCOUNTER — Ambulatory Visit: Payer: Medicare HMO | Admitting: Cardiology

## 2022-03-28 ENCOUNTER — Encounter: Payer: Self-pay | Admitting: Cardiology

## 2022-03-28 ENCOUNTER — Other Ambulatory Visit: Payer: Self-pay | Admitting: *Deleted

## 2022-03-28 VITALS — BP 158/74 | HR 68 | Ht 71.0 in | Wt 243.0 lb

## 2022-03-28 DIAGNOSIS — E785 Hyperlipidemia, unspecified: Secondary | ICD-10-CM

## 2022-03-28 DIAGNOSIS — I4891 Unspecified atrial fibrillation: Secondary | ICD-10-CM | POA: Diagnosis not present

## 2022-03-28 DIAGNOSIS — I2510 Atherosclerotic heart disease of native coronary artery without angina pectoris: Secondary | ICD-10-CM | POA: Diagnosis not present

## 2022-03-28 DIAGNOSIS — I10 Essential (primary) hypertension: Secondary | ICD-10-CM

## 2022-03-28 DIAGNOSIS — Z79899 Other long term (current) drug therapy: Secondary | ICD-10-CM | POA: Diagnosis not present

## 2022-03-28 MED ORDER — LISINOPRIL-HYDROCHLOROTHIAZIDE 20-12.5 MG PO TABS: 2.0000 | ORAL_TABLET | Freq: Every day | ORAL | 3 refills | Status: DC

## 2022-03-28 NOTE — Patient Instructions (Signed)
Medication Instructions:  Please discontinue your Lisinopril 40 and start Lisinopril/HCTZ 40-12.5 mg (2) tablets daily. Continue all  other medications as listed.  *If you need a refill on your cardiac medications before your next appointment, please call your pharmacy*  Lab Work: Please have blood work in 2 weeks (BMP)  If you have labs (blood work) drawn today and your tests are completely normal, you will receive your results only by: Stephen (if you have MyChart) OR A paper copy in the mail If you have any lab test that is abnormal or we need to change your treatment, we will call you to review the results.  Follow-Up: At [facility], you and your health needs are our priority.  As part of our continuing mission to provide you with exceptional heart care, we have created designated Provider Care Teams.  These Care Teams include your primary Cardiologist (physician) and Advanced Practice Providers (APPs -  Physician Assistants and Nurse Practitioners) who all work together to provide you with the care you need, when you need it.  We recommend signing up for the patient portal called "MyChart".  Sign up information is provided on this After Visit Summary.  MyChart is used to connect with patients for Virtual Visits (Telemedicine).  Patients are able to view lab/test results, encounter notes, upcoming appointments, etc.  Non-urgent messages can be sent to your provider as well.   To learn more about what you can do with MyChart, go to [email or web address].    Your next appointment:   1 year(s)  The format for your next appointment:   In Person  Provider:   Minus Breeding, MD    Please keep a blood pressure diary.  Important Information About Sugar

## 2022-03-29 ENCOUNTER — Other Ambulatory Visit: Payer: Self-pay | Admitting: Family Medicine

## 2022-03-29 NOTE — Addendum Note (Signed)
Addended by: Janan Halter F on: 03/29/2022 11:33 AM   Modules accepted: Orders

## 2022-04-04 ENCOUNTER — Ambulatory Visit (INDEPENDENT_AMBULATORY_CARE_PROVIDER_SITE_OTHER): Payer: Medicare HMO | Admitting: Family Medicine

## 2022-04-04 ENCOUNTER — Encounter: Payer: Self-pay | Admitting: Family Medicine

## 2022-04-04 VITALS — BP 143/69 | HR 63 | Temp 98.1°F | Ht 71.0 in | Wt 241.4 lb

## 2022-04-04 DIAGNOSIS — E1140 Type 2 diabetes mellitus with diabetic neuropathy, unspecified: Secondary | ICD-10-CM | POA: Diagnosis not present

## 2022-04-04 DIAGNOSIS — I10 Essential (primary) hypertension: Secondary | ICD-10-CM

## 2022-04-04 DIAGNOSIS — E782 Mixed hyperlipidemia: Secondary | ICD-10-CM

## 2022-04-04 DIAGNOSIS — Z23 Encounter for immunization: Secondary | ICD-10-CM | POA: Diagnosis not present

## 2022-04-04 LAB — BAYER DCA HB A1C WAIVED: HB A1C (BAYER DCA - WAIVED): 6.4 % — ABNORMAL HIGH (ref 4.8–5.6)

## 2022-04-04 MED ORDER — METOPROLOL SUCCINATE ER 100 MG PO TB24
100.0000 mg | ORAL_TABLET | Freq: Every day | ORAL | 3 refills | Status: DC
Start: 2022-04-04 — End: 2023-04-10

## 2022-04-04 MED ORDER — METOPROLOL SUCCINATE ER 100 MG PO TB24
50.0000 mg | ORAL_TABLET | Freq: Every day | ORAL | 3 refills | Status: DC
Start: 2022-04-04 — End: 2022-04-04

## 2022-04-04 NOTE — Progress Notes (Signed)
Subjective:  Patient ID: [name],  male    DOB: [birth year]  Age: 81 y.o.    CC: Medical Management of Chronic Issues   HPI MAXIE SLOVACEK presents for  follow-up of hypertension. Patient has no history of headache chest pain or shortness of breath or recent cough. Patient also denies symptoms of TIA such as numbness weakness lateralizing. Patient denies side effects from medication. States taking it regularly.  Patient also  in for follow-up of elevated cholesterol. Doing well without complaints on current medication. Denies side effects  including myalgia and arthralgia and nausea. Also in today for liver function testing. Currently no chest pain, shortness of breath or other cardiovascular related symptoms noted.  Follow-up of diabetes. Patient does check blood sugar at home. Readings run between 140-160 fasting and lower later in the day.+ Patient denies symptoms such as excessive hunger or urinary frequency, excessive hunger, nausea No significant hypoglycemic spells noted. Medications reviewed. Pt reports taking them regularly. Pt. denies complication/adverse reaction today.    History Taiquan has a past medical history of Coronary atherosclerosis of unspecified type of vessel, native or graft, Diabetes ([place]), Diverticulosis, Educated about COVID-19 virus infection (12/17/2018), ETD (eustachian tube dysfunction) (07/07/2013), Hepatomegaly, colonic polyps, Metabolic syndrome (48/25/0037), Obesity, unspecified, Obstructive sleep apnea, Other and unspecified hyperlipidemia, and Unspecified essential hypertension.   He has a past surgical history that includes Coronary stent placement (08/2006); Colonoscopy (9/17/20012); Polypectomy (09/17/20012); and Tonsillectomy.   His family history includes Cancer in his mother; Healthy in his son; Heart attack in his father; Heart disease in his father; Hypertension in his mother and sister; Irritable bowel syndrome in his maternal grandmother.He  reports that he quit smoking about 51 years ago. His smoking use included cigarettes. He smoked an average of 1 pack per day. He quit smokeless tobacco use about 32 years ago.  His smokeless tobacco use included chew. He reports current alcohol use of about 2.0 standard drinks of alcohol per week. He reports that he does not use drugs.  Current Outpatient Medications on File Prior to Visit  Medication Sig Dispense Refill   atorvastatin (LIPITOR) 40 MG tablet Take 1 tablet (40 mg total) by mouth daily. 90 tablet 3   Blood Glucose Monitoring Suppl (ONETOUCH VERIO REFLECT) w/Device KIT See admin instructions.     Blood Pressure Monitoring ([facility]S BLOOD PRESSURE MONITOR) KIT DX I10 Monitor BP daily & as needed 1 kit 0   cyanocobalamin 1000 MCG tablet Take 1,000 mcg by mouth daily.     glipiZIDE (GLUCOTROL) 10 MG tablet Take 1 tablet (10 mg total) by mouth daily before breakfast. 90 tablet 3   Lancets (ONETOUCH DELICA PLUS [id number]) MISC TEST 3 TIME DAILY DX E11.40 300 each 3   lisinopril-hydrochlorothiazide (ZESTORETIC) 20-12.5 MG tablet Take 2 tablets by mouth daily. 180 tablet 3   MAGNESIUM PO Take 420 mg by mouth.     Multiple Vitamins-Minerals (CENTRUM SILVER PO) Take 1 capsule by mouth daily.     nitroGLYCERIN (NITROSTAT) 0.4 MG SL tablet DISSOLVE 1 TABLET UNDER TONGUE AS NEEDED FOR CHEST PAIN 25 tablet prn   ONETOUCH VERIO test strip TEST 3 TIME DAILY DX E11.40 300 strip 3   rivaroxaban (XARELTO) 20 MG TABS tablet TAKE 1 TABLET (20 MG TOTAL) BY MOUTH DAILY WITH SUPPER. 90 tablet 2   tamsulosin (FLOMAX) 0.4 MG CAPS capsule Take 2 capsules (0.8 mg total) by mouth daily after supper. TAKE 1 CAPSULE BY MOUTH EVERYDAY AT BEDTIME 180 capsule 2  No current facility-administered medications on file prior to visit.    ROS Review of Systems  Constitutional:  Negative for fever.  Respiratory:  Negative for shortness of breath.   Cardiovascular:  Negative for chest pain.  Musculoskeletal:  Negative  for arthralgias.  Skin:  Negative for rash.    Objective:  BP (!) 143/69   Pulse 63   Temp 98.1 F (36.7 C)   Ht $R'5\' 11"'bw$  (1.803 m)   Wt 241 lb 6.4 oz (109.5 kg)   SpO2 95%   BMI 33.67 kg/m   BP Readings from Last 3 Encounters:  04/04/22 (!) 143/69  03/28/22 (!) 158/74  01/01/22 (!) 153/63    Wt Readings from Last 3 Encounters:  04/04/22 241 lb 6.4 oz (109.5 kg)  03/28/22 243 lb (110.2 kg)  01/01/22 235 lb 12.8 oz (107 kg)     Physical Exam Vitals reviewed.  Constitutional:      Appearance: He is well-developed.  HENT:     Head: Normocephalic and atraumatic.     Right Ear: External ear normal.     Left Ear: External ear normal.     Mouth/Throat:     Pharynx: No oropharyngeal exudate or posterior oropharyngeal erythema.  Eyes:     Pupils: Pupils are equal, round, and reactive to light.  Cardiovascular:     Rate and Rhythm: Normal rate and regular rhythm.     Heart sounds: No murmur heard. Pulmonary:     Effort: No respiratory distress.     Breath sounds: Normal breath sounds.  Musculoskeletal:     Cervical back: Normal range of motion and neck supple.  Neurological:     Mental Status: He is alert and oriented to person, place, and time.     Diabetic Foot Exam - Simple   No data filed     Lab Results  Component Value Date   HGBA1C 6.3 (H) 01/01/2022   HGBA1C 6.9 (H) 09/28/2021   HGBA1C 6.3 (H) 06/12/2021    Assessment & Plan:   Breylan was seen today for medical management of chronic issues.  Diagnoses and all orders for this visit:  Type 2 diabetes mellitus with diabetic neuropathy, without long-term current use of insulin (HCC) -     Bayer DCA Hb A1c Waived  Essential hypertension -     CBC with Differential/Platelet -     CMP14+EGFR  Mixed hyperlipidemia -     Lipid panel  Other orders -     Discontinue: metoprolol succinate (TOPROL-XL) 100 MG 24 hr tablet; Take 0.5 tablets (50 mg total) by mouth daily. Take with or immediately following  a meal. -     metoprolol succinate (TOPROL-XL) 100 MG 24 hr tablet; Take 1 tablet (100 mg total) by mouth daily. Take with or immediately following a meal.   I have discontinued Abrahan G. Pons's metoprolol succinate. I have also changed his metoprolol succinate. Additionally, I am having him maintain his Multiple Vitamins-Minerals (CENTRUM SILVER PO), cyanocobalamin, OneTouch Verio Reflect, MAGNESIUM PO, nitroGLYCERIN, CVS Blood Pressure Monitor, rivaroxaban, tamsulosin, atorvastatin, glipiZIDE, OneTouch Delica Plus [id number], lisinopril-hydrochlorothiazide, and OneTouch Verio.  Meds ordered this encounter  Medications   DISCONTD: metoprolol succinate (TOPROL-XL) 100 MG 24 hr tablet    Sig: Take 0.5 tablets (50 mg total) by mouth daily. Take with or immediately following a meal.    Dispense:  90 tablet    Refill:  3   metoprolol succinate (TOPROL-XL) 100 MG 24 hr tablet    Sig: Take  1 tablet (100 mg total) by mouth daily. Take with or immediately following a meal.    Dispense:  90 tablet    Refill:  3    Replaces earlier scrip sent in error     Follow-up: Return in about 3 months (around 07/04/2022).  Claretta Fraise, M.D.

## 2022-04-04 NOTE — Addendum Note (Signed)
Addended by: Baldomero Lamy B on: 04/04/2022 03:58 PM   Modules accepted: Orders

## 2022-04-05 ENCOUNTER — Other Ambulatory Visit: Payer: Self-pay | Admitting: Family Medicine

## 2022-04-05 LAB — CMP14+EGFR
ALT: 45 IU/L — ABNORMAL HIGH (ref 0–44)
AST: 71 IU/L — ABNORMAL HIGH (ref 0–40)
Albumin/Globulin Ratio: 1.4 (ref 1.2–2.2)
Albumin: 3.8 g/dL (ref 3.8–4.8)
Alkaline Phosphatase: 118 IU/L (ref 44–121)
BUN/Creatinine Ratio: 13 (ref 10–24)
BUN: 16 mg/dL (ref 8–27)
Bilirubin Total: 1.1 mg/dL (ref 0.0–1.2)
CO2: 24 mmol/L (ref 20–29)
Calcium: 9.2 mg/dL (ref 8.6–10.2)
Chloride: 100 mmol/L (ref 96–106)
Creatinine, Ser: 1.24 mg/dL (ref 0.76–1.27)
Globulin, Total: 2.8 g/dL (ref 1.5–4.5)
Glucose: 200 mg/dL — ABNORMAL HIGH (ref 70–99)
Potassium: 4.7 mmol/L (ref 3.5–5.2)
Sodium: 139 mmol/L (ref 134–144)
Total Protein: 6.6 g/dL (ref 6.0–8.5)
eGFR: 59 mL/min/{1.73_m2} — ABNORMAL LOW (ref >59)

## 2022-04-05 LAB — LIPID PANEL
Chol/HDL Ratio: 1.9 ratio (ref 0.0–5.0)
Cholesterol, Total: 92 mg/dL — ABNORMAL LOW (ref 100–199)
HDL: 49 mg/dL (ref >39)
LDL Chol Calc (NIH): 22 mg/dL (ref 0–99)
Triglycerides: 120 mg/dL (ref 0–149)
VLDL Cholesterol Cal: 21 mg/dL (ref 5–40)

## 2022-04-05 LAB — CBC WITH DIFFERENTIAL/PLATELET
Basophils Absolute: 0 10*3/uL (ref 0.0–0.2)
Basos: 1 %
EOS (ABSOLUTE): 0.1 10*3/uL (ref 0.0–0.4)
Eos: 2 %
Hematocrit: 35.7 % — ABNORMAL LOW (ref 37.5–51.0)
Hemoglobin: 12.3 g/dL — ABNORMAL LOW (ref 13.0–17.7)
Immature Grans (Abs): 0 10*3/uL (ref 0.0–0.1)
Immature Granulocytes: 0 %
Lymphocytes Absolute: 0.8 10*3/uL (ref 0.7–3.1)
Lymphs: 23 %
MCH: 34.1 pg — ABNORMAL HIGH (ref 26.6–33.0)
MCHC: 34.5 g/dL (ref 31.5–35.7)
MCV: 99 fL — ABNORMAL HIGH (ref 79–97)
Monocytes Absolute: 0.2 10*3/uL (ref 0.1–0.9)
Monocytes: 7 %
Neutrophils Absolute: 2.2 10*3/uL (ref 1.4–7.0)
Neutrophils: 67 %
RBC: 3.61 x10E6/uL — ABNORMAL LOW (ref 4.14–5.80)
RDW: 14.9 % (ref 11.6–15.4)
WBC: 3.3 10*3/uL — ABNORMAL LOW (ref 3.4–10.8)

## 2022-04-11 LAB — B12 AND FOLATE PANEL
Folate: >20 ng/mL
Vitamin B-12: 1362 pg/mL — ABNORMAL HIGH (ref 232–1245)

## 2022-04-11 LAB — SPECIMEN STATUS REPORT

## 2022-04-23 ENCOUNTER — Telehealth: Payer: Self-pay | Admitting: Family Medicine

## 2022-04-23 NOTE — Telephone Encounter (Signed)
Decrease the lisinopril to once a day.

## 2022-04-23 NOTE — Telephone Encounter (Signed)
Patient aware.

## 2022-04-23 NOTE — Telephone Encounter (Signed)
Pt called stating that when he takes his Lisinopril and Metoprolol, he has dizziness. Needs advise.

## 2022-04-25 ENCOUNTER — Telehealth: Payer: Self-pay | Admitting: *Deleted

## 2022-04-25 NOTE — Telephone Encounter (Signed)
Received blood pressure diary left in [place] office for review.  Dr Percival Spanish reviewed and advised pt to increase his Lisinopril?HCT to 40/25 mg daily.  Called and spoke with pt who had been taking Lisinopril/HCTZ 20-12.5 mg (2) tablets daily since 03/28/22 when ordered by Dr Percival Spanish.  Pt reports he has been having trouble with dizziness, leg weakness and in fact has fallen once. His BP on Saturday was 98/54.   He contacted Dr Quinn Axe (see phone note from 9/25) who decreased pt's Lisinopril/HCTZ to 20-12.5 mg once daily.  Pt reports he has continued to monitor his BP and will continue to check it daily.  Since the decrease BP has been 120-130/60s.  Will notify Dr Percival Spanish.

## 2022-06-09 DIAGNOSIS — G4733 Obstructive sleep apnea (adult) (pediatric): Secondary | ICD-10-CM | POA: Diagnosis not present

## 2022-06-09 DIAGNOSIS — Z8249 Family history of ischemic heart disease and other diseases of the circulatory system: Secondary | ICD-10-CM | POA: Diagnosis not present

## 2022-06-09 DIAGNOSIS — E1122 Type 2 diabetes mellitus with diabetic chronic kidney disease: Secondary | ICD-10-CM | POA: Diagnosis not present

## 2022-06-09 DIAGNOSIS — E11620 Type 2 diabetes mellitus with diabetic dermatitis: Secondary | ICD-10-CM | POA: Diagnosis not present

## 2022-06-09 DIAGNOSIS — Z7901 Long term (current) use of anticoagulants: Secondary | ICD-10-CM | POA: Diagnosis not present

## 2022-06-09 DIAGNOSIS — E785 Hyperlipidemia, unspecified: Secondary | ICD-10-CM | POA: Diagnosis not present

## 2022-06-09 DIAGNOSIS — E1142 Type 2 diabetes mellitus with diabetic polyneuropathy: Secondary | ICD-10-CM | POA: Diagnosis not present

## 2022-06-09 DIAGNOSIS — N400 Enlarged prostate without lower urinary tract symptoms: Secondary | ICD-10-CM | POA: Diagnosis not present

## 2022-06-09 DIAGNOSIS — E669 Obesity, unspecified: Secondary | ICD-10-CM | POA: Diagnosis not present

## 2022-06-09 DIAGNOSIS — N1831 Chronic kidney disease, stage 3a: Secondary | ICD-10-CM | POA: Diagnosis not present

## 2022-06-09 DIAGNOSIS — I129 Hypertensive chronic kidney disease with stage 1 through stage 4 chronic kidney disease, or unspecified chronic kidney disease: Secondary | ICD-10-CM | POA: Diagnosis not present

## 2022-06-09 DIAGNOSIS — Z008 Encounter for other general examination: Secondary | ICD-10-CM | POA: Diagnosis not present

## 2022-06-09 DIAGNOSIS — I2510 Atherosclerotic heart disease of native coronary artery without angina pectoris: Secondary | ICD-10-CM | POA: Diagnosis not present

## 2022-06-28 DIAGNOSIS — G4733 Obstructive sleep apnea (adult) (pediatric): Secondary | ICD-10-CM | POA: Diagnosis not present

## 2022-07-05 ENCOUNTER — Ambulatory Visit: Payer: Medicare HMO | Admitting: Family Medicine

## 2022-07-11 ENCOUNTER — Ambulatory Visit: Payer: Medicare HMO | Admitting: Family Medicine

## 2022-10-25 DIAGNOSIS — G4733 Obstructive sleep apnea (adult) (pediatric): Secondary | ICD-10-CM | POA: Diagnosis not present

## 2022-11-29 ENCOUNTER — Telehealth: Payer: Self-pay | Admitting: Family Medicine

## 2022-11-29 MED ORDER — LANCET DEVICE MISC: 1.0000 | Freq: Three times a day (TID) | 0 refills | Status: AC

## 2022-11-29 MED ORDER — LANCETS MISC. MISC: 1.0000 | Freq: Three times a day (TID) | 0 refills | Status: AC

## 2022-11-29 MED ORDER — BLOOD GLUCOSE MONITORING SUPPL DEVI: 1.0000 | Freq: Three times a day (TID) | 0 refills | Status: DC

## 2022-11-29 MED ORDER — BLOOD GLUCOSE TEST VI STRP: 1.0000 | ORAL_STRIP | Freq: Three times a day (TID) | 0 refills | Status: DC

## 2022-11-29 NOTE — Telephone Encounter (Signed)
Pt asking for a rx for glucose monitor. He has one now--verio reflex one touch that is not working. He says that he has had to use 6 strips because monitor not working. He needs a rx for strips too. [facility].

## 2022-11-29 NOTE — Telephone Encounter (Signed)
Rx sent to pharmacy for patient for new meter.

## 2023-01-04 ENCOUNTER — Other Ambulatory Visit: Payer: Self-pay | Admitting: Family Medicine

## 2023-01-04 DIAGNOSIS — E119 Type 2 diabetes mellitus without complications: Secondary | ICD-10-CM

## 2023-01-08 ENCOUNTER — Other Ambulatory Visit: Payer: Self-pay | Admitting: Family Medicine

## 2023-01-08 DIAGNOSIS — E119 Type 2 diabetes mellitus without complications: Secondary | ICD-10-CM

## 2023-01-09 ENCOUNTER — Encounter: Payer: Self-pay | Admitting: Family Medicine

## 2023-01-09 NOTE — Telephone Encounter (Signed)
Stacks pt NTBS 30-d given 01/04/23

## 2023-01-09 NOTE — Telephone Encounter (Signed)
LMTCB TO SCHEDULE APPT Letter mailed 

## 2023-01-09 NOTE — Telephone Encounter (Signed)
Patient called back and said that he gets this medication from the [facility] in [place] and does not need from [place]

## 2023-02-05 DIAGNOSIS — G4733 Obstructive sleep apnea (adult) (pediatric): Secondary | ICD-10-CM | POA: Diagnosis not present

## 2023-02-12 ENCOUNTER — Encounter: Payer: Self-pay | Admitting: Nutrition

## 2023-02-12 ENCOUNTER — Encounter: Payer: Medicare HMO | Attending: Family Medicine | Admitting: Nutrition

## 2023-02-12 VITALS — Ht 71.0 in | Wt 244.0 lb

## 2023-02-12 DIAGNOSIS — I10 Essential (primary) hypertension: Secondary | ICD-10-CM

## 2023-02-12 DIAGNOSIS — I2510 Atherosclerotic heart disease of native coronary artery without angina pectoris: Secondary | ICD-10-CM

## 2023-02-12 DIAGNOSIS — K760 Fatty (change of) liver, not elsewhere classified: Secondary | ICD-10-CM | POA: Diagnosis not present

## 2023-02-12 DIAGNOSIS — E782 Mixed hyperlipidemia: Secondary | ICD-10-CM

## 2023-02-12 DIAGNOSIS — E1140 Type 2 diabetes mellitus with diabetic neuropathy, unspecified: Secondary | ICD-10-CM

## 2023-02-12 NOTE — Patient Instructions (Addendum)
Goals  Eat 3 meals per days at times discussed. Eat more whole plant based foods at meals every day. Get A1C down to 7% or less.

## 2023-02-12 NOTE — Progress Notes (Unsigned)
Medical Nutrition Therapy  Appointment Start time:  0930  Appointment End time:  1100  Primary concerns today: Dm Type 2, Obesity  Referral diagnosis: E11.8 Preferred learning style: Read  Learning readiness: Ready    NUTRITION ASSESSMENT  82 yr old wmale referred for Type 2 DM and Obesity.  He is getting his medical care through the [place]. Local PCP has been Dr. Mechele Claude.  PMH: Obesity, Type 2 DM, HTN and Hyperlipidemia. He thinks his last A1C was > 7.5%, almost close to 8%. No report available. Eat eats 2 meals per day. Skips supper. Walks with a cane.  He notes he has had a lot of dizziness recently when he stands up or sometimes during the day. Hasn't test blood sugars when he felt that way.  FBS are 200-240's Bedtime 200-150's. BS high in am from skipping dinner. Currently taking Metformin 500 mg BID and Glimepiride twice a day.  BS are poorly controlled. He notes he is not snacking and has cut down on portions Current diet is insuffient to meet his nutritional needs and manage his DM. He is wiling to make some behavior changes and food choices.  He is willing to work with Lifestyle Medicine to help improve his chronic health issues and possibly reverse his DM and other health issues. He will focus on more whole plant based foods and increase a little walking.  Anthropometrics  Wt Readings from Last 3 Encounters:  04/04/22 241 lb 6.4 oz (109.5 kg)  03/28/22 243 lb (110.2 kg)  01/01/22 235 lb 12.8 oz (107 kg)   Ht Readings from Last 3 Encounters:  04/04/22 5\' 11"  (1.803 m)  03/28/22 5\' 11"  (1.803 m)  01/01/22 5\' 11"  (1.803 m)   There is no height or weight on file to calculate BMI. @BMIFA @ Facility age limit for growth %iles is 20 years. Facility age limit for growth %iles is 20 years.    Clinical Medical Hx: TYPe 2 DM, HLD, HTN, OBesity,  cirrhosis of liver,CAD, Anemia, Neuropathy, CHF, BPH. Medications: Metformin and Glimeperide Labs:  Lab Results  Component  Value Date   HGBA1C 6.4 (H) 04/04/2022    Notable Signs/Symptoms: None  Lifestyle & Dietary Hx Lives with his wife. He does the cooking. Walks with a cane. Limited mobility.  Estimated daily fluid intake: 60 oz Supplements: MVI, Vit B 12, Mag ox Sleep: 6-8 hrs Stress / self-care:  No issues Current average weekly physical activity:ADL  24-Hr Dietary Recall First Meal: Coffee, banana, boiled egg and 1 slice ww dry toast Snack:  Second Meal: Chicken filet patty with slaw- no bread, water Snack:  Third Meal: PInto beans, blueberry yogurt, water Snack:  Beverages: water  Estimated Energy Needs Calories: 1800 Carbohydrate: 200g Protein: 135g Fat: 50g   NUTRITION DIAGNOSIS  NB-1.1 Food and nutrition-related knowledge deficit As related to Diabetes Type 2 uncontrolled .  As evidenced by A1C > 7.5%.   NUTRITION INTERVENTION  Nutrition education (E-1) on the following topics:  Nutrition and Diabetes education provided on My Plate, CHO counting, meal planning, portion sizes, timing of meals, avoiding snacks between meals unless having a low blood sugar, target ranges for A1C and blood sugars, signs/symptoms and treatment of hyper/hypoglycemia, monitoring blood sugars, taking medications as prescribed, benefits of exercising 30 minutes per day and prevention of complications of DM.   Lifestyle Medicine  - Whole Food, Plant Predominant Nutrition is highly recommended: Eat Plenty of vegetables, Mushrooms, fruits, Legumes, Whole Grains, Nuts, seeds in lieu of processed meats,  processed snacks/pastries red meat, poultry, eggs.    -It is better to avoid simple carbohydrates including: Cakes, Sweet Desserts, Ice Cream, Soda (diet and regular), Sweet Tea, Candies, Chips, Cookies, Store Bought Juices, Alcohol in Excess of  1-2 drinks a day, Lemonade,  Artificial Sweeteners, Doughnuts, Coffee Creamers, "Sugar-free" Products, etc, etc.  This is not a complete list.....  Exercise: If you are  able: 30 -60 minutes a day ,4 days a week, or 150 minutes a week.  The longer the better.  Combine stretch, strength, and aerobic activities.  If you were told in the past that you have high risk for cardiovascular diseases, you may seek evaluation by your heart doctor prior to initiating moderate to intense exercise programs.   Handouts Provided Include  Lifestyle Medicine Know your numbers  Learning Style & Readiness for Change Teaching method utilized: Visual & Auditory  Demonstrated degree of understanding via: Teach Back  Barriers to learning/adherence to lifestyle change: none  Goals Established by Pt Goals  Eat 3 meals per days at times discussed. Eat more whole plant based foods at meals every day. Get A1C down to 7% or less.   MONITORING & EVALUATION Dietary intake, weekly physical activity, and blood sugars and weight in 1 month.  Next Steps  Patient is to work on eating balanced whole plant based meals at times discussed. Marland Kitchen

## 2023-02-13 ENCOUNTER — Encounter: Payer: Self-pay | Admitting: Nutrition

## 2023-02-16 DIAGNOSIS — M1990 Unspecified osteoarthritis, unspecified site: Secondary | ICD-10-CM | POA: Diagnosis not present

## 2023-02-16 DIAGNOSIS — I129 Hypertensive chronic kidney disease with stage 1 through stage 4 chronic kidney disease, or unspecified chronic kidney disease: Secondary | ICD-10-CM | POA: Diagnosis not present

## 2023-02-16 DIAGNOSIS — E1122 Type 2 diabetes mellitus with diabetic chronic kidney disease: Secondary | ICD-10-CM | POA: Diagnosis not present

## 2023-02-16 DIAGNOSIS — N529 Male erectile dysfunction, unspecified: Secondary | ICD-10-CM | POA: Diagnosis not present

## 2023-02-16 DIAGNOSIS — G4733 Obstructive sleep apnea (adult) (pediatric): Secondary | ICD-10-CM | POA: Diagnosis not present

## 2023-02-16 DIAGNOSIS — E1142 Type 2 diabetes mellitus with diabetic polyneuropathy: Secondary | ICD-10-CM | POA: Diagnosis not present

## 2023-02-16 DIAGNOSIS — I2510 Atherosclerotic heart disease of native coronary artery without angina pectoris: Secondary | ICD-10-CM | POA: Diagnosis not present

## 2023-02-16 DIAGNOSIS — I4891 Unspecified atrial fibrillation: Secondary | ICD-10-CM | POA: Diagnosis not present

## 2023-02-16 DIAGNOSIS — N1831 Chronic kidney disease, stage 3a: Secondary | ICD-10-CM | POA: Diagnosis not present

## 2023-02-16 DIAGNOSIS — D6869 Other thrombophilia: Secondary | ICD-10-CM | POA: Diagnosis not present

## 2023-02-16 DIAGNOSIS — E785 Hyperlipidemia, unspecified: Secondary | ICD-10-CM | POA: Diagnosis not present

## 2023-02-16 DIAGNOSIS — N400 Enlarged prostate without lower urinary tract symptoms: Secondary | ICD-10-CM | POA: Diagnosis not present

## 2023-02-28 ENCOUNTER — Ambulatory Visit (INDEPENDENT_AMBULATORY_CARE_PROVIDER_SITE_OTHER): Payer: Medicare HMO

## 2023-02-28 VITALS — Ht 71.0 in | Wt 240.0 lb

## 2023-02-28 DIAGNOSIS — Z0000 Encounter for general adult medical examination without abnormal findings: Secondary | ICD-10-CM

## 2023-02-28 NOTE — Progress Notes (Signed)
Subjective:   [name] is a 82 y.o. male who presents for Medicare Annual/Subsequent preventive examination.  Visit Complete: Virtual  I connected with  CON QUINNEY on 02/28/23 by a video and audio enabled telemedicine application and verified that I am speaking with the correct person using two identifiers.  Patient Location: Home  Provider Location: Home Office  I discussed the limitations of evaluation and management by telemedicine. The patient expressed understanding and agreed to proceed.  Patient Medicare AWV questionnaire was completed by the patient on 02/28/2023; I have confirmed that all information answered by patient is correct and no changes since this date.  Review of Systems    Vital Signs: Unable to obtain new vitals due to this being a telehealth visit.  Cardiac Risk Factors include: advanced age (>51men, >33 women);diabetes mellitus;dyslipidemia;male gender;hypertension Nutrition Risk Assessment:  Has the patient had any N/V/D within the last 2 months?  No  Does the patient have any non-healing wounds?  No  Has the patient had any unintentional weight loss or weight gain?  No   Diabetes:  Is the patient diabetic?  Yes  If diabetic, was a CBG obtained today?  No  Did the patient bring in their glucometer from home?  No  How often do you monitor your CBG's? 2 x day .   Financial Strains and Diabetes Management:  Are you having any financial strains with the device, your supplies or your medication? No .  Does the patient want to be seen by Chronic Care Management for management of their diabetes?  No  Would the patient like to be referred to a Nutritionist or for Diabetic Management?  No   Diabetic Exams:  Diabetic Eye Exam: Overdue for diabetic eye exam. Pt has been advised about the importance in completing this exam. Patient advised to call and schedule an eye exam. Diabetic Foot Exam: Overdue, Pt has been advised about the importance in  completing this exam. Pt is scheduled for diabetic foot exam on next office visit .     Objective:    Today's Vitals   02/28/23 0958  Weight: 240 lb (108.9 kg)  Height: 5\' 11"  (1.803 m)   Body mass index is 33.47 kg/m.     02/28/2023   10:06 AM 04/19/2021    9:58 AM 11/19/2019    1:00 PM 07/15/2019    1:44 PM 06/30/2019   11:24 AM 06/29/2019    3:33 PM 09/04/2017    2:21 PM  Advanced Directives  Does Patient Have a Medical Advance Directive? No No No No No No No  Would patient like information on creating a medical advance directive? Yes (MAU/Ambulatory/Procedural Areas - Information given) No - Patient declined No - Patient declined No - Patient declined No - Patient declined No - Patient declined Yes (MAU/Ambulatory/Procedural Areas - Information given)    Current Medications (verified) Outpatient Encounter Medications as of 02/28/2023  Medication Sig   atorvastatin (LIPITOR) 40 MG tablet Take 1 tablet (40 mg total) by mouth daily.   Blood Glucose Monitoring Suppl DEVI 1 each by Does not apply route in the morning, at noon, and at bedtime. May substitute to any manufacturer covered by patient's insurance. Dx: E11.40   Blood Pressure Monitoring ([employer] BLOOD PRESSURE MONITOR) KIT DX I10 Monitor BP daily & as needed   cyanocobalamin 1000 MCG tablet Take 1,000 mcg by mouth daily.   glimepiride (AMARYL) 2 MG tablet Take 2 mg by mouth at bedtime.   glimepiride (  AMARYL) 4 MG tablet Take 4 mg by mouth daily with breakfast.   glipiZIDE (GLUCOTROL) 10 MG tablet Take 1 tablet (10 mg total) by mouth daily before breakfast. (NEEDS TO BE SEEN BEFORE NEXT REFILL)   Glucose Blood (BLOOD GLUCOSE TEST STRIPS) STRP 1 each by In Vitro route in the morning, at noon, and at bedtime. May substitute to any manufacturer covered by patient's insurance. Dx: E11.40   Lancets (ONETOUCH DELICA PLUS LANCET33G) MISC TEST 3 TIME DAILY DX E11.40   lisinopril (ZESTRIL) 40 MG tablet Take 40 mg by mouth daily.    lisinopril-hydrochlorothiazide (ZESTORETIC) 20-12.5 MG tablet Take 2 tablets by mouth daily.   MAGNESIUM PO Take 420 mg by mouth.   metformin (FORTAMET) 500 MG (OSM) 24 hr tablet Take 500 mg by mouth 2 (two) times daily with a meal.   metoprolol succinate (TOPROL-XL) 100 MG 24 hr tablet Take 1 tablet (100 mg total) by mouth daily. Take with or immediately following a meal.   Multiple Vitamins-Minerals (CENTRUM SILVER PO) Take 1 capsule by mouth daily.   nitroGLYCERIN (NITROSTAT) 0.4 MG SL tablet DISSOLVE 1 TABLET UNDER TONGUE AS NEEDED FOR CHEST PAIN   rivaroxaban (XARELTO) 20 MG TABS tablet TAKE 1 TABLET (20 MG TOTAL) BY MOUTH DAILY WITH SUPPER.   tamsulosin (FLOMAX) 0.4 MG CAPS capsule Take 2 capsules (0.8 mg total) by mouth daily after supper. TAKE 1 CAPSULE BY MOUTH EVERYDAY AT BEDTIME   No facility-administered encounter medications on file as of 02/28/2023.    Allergies (verified) Patient has no known allergies.   History: Past Medical History:  Diagnosis Date   Coronary atherosclerosis of unspecified type of vessel, native or graft    Diabetes (HCC)    Diverticulosis    Educated about COVID-19 virus infection 12/17/2018   ETD (eustachian tube dysfunction) 07/07/2013   Hepatomegaly    Hx of colonic polyps    Metabolic syndrome 11/11/2012   Obesity, unspecified    Obstructive sleep apnea    Uses CPAP.    Other and unspecified hyperlipidemia    Unspecified essential hypertension    Past Surgical History:  Procedure Laterality Date   COLONOSCOPY  9/17/20012   CORONARY STENT PLACEMENT  08/2006   LAD   POLYPECTOMY  09/17/20012   TONSILLECTOMY     Family History  Problem Relation Age of Onset   Heart disease Father        and Multiple family members on father Side    Heart attack Father    Cancer Mother    Hypertension Mother    Hypertension Sister    Healthy Son    Irritable bowel syndrome Maternal Grandmother    Colon cancer Neg Hx    Social History    Socioeconomic History   Marital status: Married    Spouse name: Not on file   Number of children: 1   Years of education: 12 years   Highest education level: Some college, no degree  Occupational History   Occupation: Marine scientist    Comment: Retired    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 He is married and lives at home with his wife. He has one adult son. Their teenage granddaughter has recently moved in with them.    Right handed    Caffeine~ does not use    Social Determinants of Health   Financial Resource Strain: Low Risk  (02/28/2023)   Overall Financial Resource Strain (CARDIA)    Difficulty of Paying Living Expenses: Not hard at all  Food Insecurity: No Food Insecurity (02/28/2023)   Hunger Vital Sign    Worried About Running Out of Food in the Last Year: Never true    Ran Out of Food in the Last Year: Never true  Transportation Needs: No Transportation Needs (02/28/2023)   PRAPARE - Administrator, Civil Service (Medical): No    Lack of Transportation (Non-Medical): No  Physical Activity: Insufficiently Active (02/28/2023)   Exercise Vital Sign    Days of Exercise per Week: 3 days    Minutes of Exercise per Session: 30 min  Stress: No Stress Concern Present (02/28/2023)   [employer] of Occupational Health - Occupational Stress Questionnaire     Feeling of Stress : Not at all  Social Connections: Unknown (12/08/2021)   Received from [facility]   Social Network    Social Network: Not on file    Tobacco Counseling Counseling given: Not Answered   Clinical Intake:  Pre-visit preparation completed: Yes  Pain : No/denies pain     Nutritional Risks: None Diabetes: Yes CBG done?: No Did pt. bring in CBG monitor from home?: No  How often do you need to have someone help you when you read instructions, pamphlets, or other written materials from your doctor or pharmacy?: 1 - Never  Interpreter Needed?: No  Information entered by :: Renie Ora, LPN   Activities of Daily Living    02/28/2023   10:06 AM  In your present state of health, do you have any difficulty performing the following activities:  Hearing? 0  Vision? 0  Difficulty concentrating or making decisions? 0  Walking or climbing stairs? 0  Dressing or bathing? 0  Doing errands, shopping? 0  Preparing Food and eating ? N  Using the Toilet? N  In the past six months, have you accidently leaked urine? N  Do you have problems with loss of bowel control? N  Managing your Medications? N  Managing your Finances? N  Housekeeping or managing your Housekeeping? N    Patient Care Team: Mechele Claude, MD as PCP - General (Family Medicine) Laurey Morale, MD as Consulting Physician (Cardiology) Hilarie Fredrickson, MD as Consulting Physician (Gastroenterology)  Indicate any recent Medical Services you may have received from other than [facility] providers in the past year (date may be approximate).     Assessment:   This is a routine wellness examination for [place].  Hearing/Vision screen Vision Screening - Comments:: Patient to call schedule eye at [facility]   Dietary issues and exercise activities discussed:     Goals Addressed             This Visit's Progress    Exercise 3x per week (  30 min per time)   On track    Continue to exercise 3 times per week        Depression Screen    02/28/2023   10:01 AM 02/12/2023    9:55 AM 04/04/2022    7:48 AM 01/01/2022    8:40 AM 01/01/2022    8:02 AM 09/28/2021    8:07 AM 09/28/2021    8:05 AM  PHQ 2/9 Scores  PHQ - 2 Score 0 0 0 0 0 0 0  PHQ- 9 Score      3     Fall Risk    02/28/2023    9:59 AM 04/04/2022    7:48 AM 01/01/2022    8:40 AM 01/01/2022    8:02 AM 09/28/2021    8:05 AM  Fall Risk   Falls in the past year? 0 0 0 0 0  Number falls in past yr: 0      Injury with Fall? 0      Risk for fall due to : No Fall Risks      Follow up Falls prevention discussed        MEDICARE RISK AT HOME:  Medicare Risk at Home - 02/28/23 0959     Any stairs in or around the home? Yes    If so, are there any without handrails? No    Home free of loose throw rugs in walkways, pet beds, electrical cords, etc? Yes    Adequate lighting in your home to reduce risk of falls? Yes    Life alert? No    Use of a cane, walker or w/c? No    Grab bars in the bathroom? Yes    Shower chair or bench in shower? Yes    Elevated toilet seat or a handicapped toilet? Yes             TIMED UP AND GO:  Was the test performed?  No    Cognitive Function:    09/04/2017    2:29 PM 05/25/2016    3:50 PM 12/21/2014    9:41 AM  MMSE - Mini Mental State Exam  Orientation to time 5 5 5   Orientation to Place 5 5 5   Registration 3 3 3   Attention/ Calculation 5 5 5   Recall 3 3 3   Language- name 2 objects 2 2 2   Language- repeat 1 1 1   Language- follow 3 step command 3 3 3   Language- read & follow direction 1 1 1   Write a sentence 1 1 1   Copy design 1 1 1   Total score 30 30 30         02/28/2023   10:06 AM  6CIT Screen  What Year? 0 points  What month? 0 points  What time? 0 points  Count back from 20 0 points  Months in reverse 0 points  Repeat phrase 0 points  Total Score 0 points    Immunizations Immunization History  Administered Date(s) Administered   Fluad Quad(high Dose 65+) 05/11/2019   Influenza Split  04/29/2013   Influenza,inj,Quad PF,6+ Mos 05/02/2015   Influenza-Unspecified 05/26/2014, 05/15/2016, 05/20/2017   Moderna Sars-Covid-2 Vaccination 08/22/2019, 09/19/2019, 03/30/2020, 10/28/2020   Pneumococcal Conjugate-13 06/14/2014   Pneumococcal Polysaccharide-23 06/09/2010   Tdap 01/12/2011, 04/04/2022   Zoster Recombinant(Shingrix) 04/04/2022   Zoster, Live 10/29/2014    TDAP status: Up to date  Flu Vaccine status: Up to date  Pneumococcal vaccine status: Up to date  Covid-19 vaccine status: Completed vaccines  Qualifies  for Shingles Vaccine? No   Zostavax completed No   Shingrix Completed?: Yes  Screening Tests Health Maintenance  Topic Date Due   OPHTHALMOLOGY EXAM  Never done   Diabetic kidney evaluation - Urine ACR  09/13/2021   FOOT EXAM  12/08/2021   COVID-19 Vaccine (5 - 2023-24 season) 03/30/2022   Zoster Vaccines- Shingrix (2 of 2) 05/30/2022   HEMOGLOBIN A1C  10/03/2022   INFLUENZA VACCINE  02/28/2023   Diabetic kidney evaluation - eGFR measurement  04/05/2023   Medicare Annual Wellness (AWV)  02/28/2024   Colonoscopy  10/14/2024   DTaP/Tdap/Td (3 - Td or Tdap) 04/04/2032   Pneumonia Vaccine 76+ Years old  Completed   HPV VACCINES  Aged Out   Hepatitis C Screening  Discontinued    Health Maintenance  Health Maintenance Due  Topic Date Due   OPHTHALMOLOGY EXAM  Never done   Diabetic kidney evaluation - Urine ACR  09/13/2021   FOOT EXAM  12/08/2021   COVID-19 Vaccine (5 - 2023-24 season) 03/30/2022   Zoster Vaccines- Shingrix (2 of 2) 05/30/2022   HEMOGLOBIN A1C  10/03/2022   INFLUENZA VACCINE  02/28/2023    Colorectal cancer screening: No longer required.   Lung Cancer Screening: (Low Dose CT Chest recommended if Age 51-80 years, 20 pack-year currently smoking OR have quit w/in 15years.) does not qualify.   Lung Cancer Screening Referral: n/a  Additional Screening:  Hepatitis C Screening: does not qualify;  Vision Screening: Recommended  annual ophthalmology exams for early detection of glaucoma and other disorders of the eye. Is the patient up to date with their annual eye exam?  No  Who is the provider or what [facility] the [facility] the [facility]  If pt is not established with a provider, would they like to be referred to a provider to establish care? No .   Dental Screening: Recommended annual dental exams for proper oral hygiene    Community Resource Referral / Chronic Care Management: CRR required this visit?  No   CCM required this visit?  No     Plan:     I have personally reviewed and noted the following in the patient's chart:   Medical and social history Use of alcohol, tobacco or illicit drugs  Current medications and supplements including opioid prescriptions. Patient is not currently taking opioid prescriptions. Functional ability and status Nutritional status Physical activity Advanced directives List of other physicians Hospitalizations, surgeries, and ER visits in previous 12 months Vitals Screenings to include cognitive, depression, and falls Referrals and appointments  In addition, I have reviewed and discussed with patient certain preventive protocols, quality metrics, and best practice recommendations. A written personalized care plan for preventive services as well as general preventive health recommendations were provided to patient.     Lorrene Reid, LPN   08/04/1094   After Visit Summary: (MyChart) Due to this being a telephonic visit, the after visit summary with patients personalized plan was offered to patient via MyChart   Nurse Notes: none

## 2023-02-28 NOTE — Patient Instructions (Signed)
[name] , Thank you for taking time to come for your Medicare Wellness Visit. I appreciate your ongoing commitment to your health goals. Please review the following plan we discussed and let me know if I can assist you in the future.   Referrals/Orders/Follow-Ups/Clinician Recommendations: Aim for 30 minutes of exercise or brisk walking, 6-8 glasses of water, and 5 servings of fruits and vegetables each day.   This is a list of the screening recommended for you and due dates:  Health Maintenance  Topic Date Due   Eye exam for diabetics  Never done   Yearly kidney health urinalysis for diabetes  09/13/2021   Complete foot exam   12/08/2021   COVID-19 Vaccine (5 - 2023-24 season) 03/30/2022   Zoster (Shingles) Vaccine (2 of 2) 05/30/2022   Hemoglobin A1C  10/03/2022   Flu Shot  02/28/2023   Yearly kidney function blood test for diabetes  04/05/2023   Medicare Annual Wellness Visit  02/28/2024   Colon Cancer Screening  10/14/2024   DTaP/Tdap/Td vaccine (3 - Td or Tdap) 04/04/2032   Pneumonia Vaccine  Completed   HPV Vaccine  Aged Out   Hepatitis C Screening  Discontinued    Advanced directives: (ACP Link)Information on Advanced Care Planning can be found at [facility] Advance Health Care Directives Advance Health Care Directives ([email or web address] Information on Advanced Care Planning can be found at [facility] Advance Health Care Directives Advance Health Care Directives ([email or web address]    Next Medicare Annual Wellness Visit scheduled for next year: Yes  Preventive Care 65 Years and Older, Male  Preventive care refers to lifestyle choices and visits with your health care provider that can promote health and wellness. What does preventive care include? A yearly physical exam. This is also called an annual well check. Dental exams once or twice a year. Routine eye exams. Ask your health care provider how often you should have your eyes  checked. Personal lifestyle choices, including: Daily care of your teeth and gums. Regular physical activity. Eating a healthy diet. Avoiding tobacco and drug use. Limiting alcohol use. Practicing safe sex. Taking low doses of aspirin every day. Taking vitamin and mineral supplements as recommended by your health care provider. What happens during an annual well check? The services and screenings done by your health care provider during your annual well check will depend on your age, overall health, lifestyle risk factors, and family history of disease. Counseling  Your health care provider may ask you questions about your: Alcohol use. Tobacco use. Drug use. Emotional well-being. Home and relationship well-being. Sexual activity. Eating habits. History of falls. Memory and ability to understand (cognition). Work and work Astronomer. Screening  You may have the following tests or measurements: Height, weight, and BMI. Blood pressure. Lipid and cholesterol levels. These may be checked every 5 years, or more frequently if you are over 18 years old. Skin check. Lung cancer screening. You may have this screening every year starting at age 5 if you have a 30-pack-year history of smoking and currently smoke or have quit within the past 15 years. Fecal occult blood test (FOBT) of the stool. You may have this test every year starting at age 54. Flexible sigmoidoscopy or colonoscopy. You may have a sigmoidoscopy every 5 years or a colonoscopy every 10 years starting at age 49. Prostate cancer screening. Recommendations will vary depending on your family history and other risks. Hepatitis C blood test. Hepatitis B blood test. Sexually  transmitted disease (STD) testing. Diabetes screening. This is done by checking your blood sugar (glucose) after you have not eaten for a while (fasting). You may have this done every 1-3 years. Abdominal aortic aneurysm (AAA) screening. You may need this  if you are a current or former smoker. Osteoporosis. You may be screened starting at age 23 if you are at high risk. Talk with your health care provider about your test results, treatment options, and if necessary, the need for more tests. Vaccines  Your health care provider may recommend certain vaccines, such as: Influenza vaccine. This is recommended every year. Tetanus, diphtheria, and acellular pertussis (Tdap, Td) vaccine. You may need a Td booster every 10 years. Zoster vaccine. You may need this after age 41. Pneumococcal 13-valent conjugate (PCV13) vaccine. One dose is recommended after age 47. Pneumococcal polysaccharide (PPSV23) vaccine. One dose is recommended after age 69. Talk to your health care provider about which screenings and vaccines you need and how often you need them. This information is not intended to replace advice given to you by your health care provider. Make sure you discuss any questions you have with your health care provider. Document Released: 08/12/2015 Document Revised: 04/04/2016 Document Reviewed: 05/17/2015 Elsevier Interactive Patient Education  2017 [employer].  Fall Prevention in the Home Falls can cause injuries. They can happen to people of all ages. There are many things you can do to make your home safe and to help prevent falls. What can I do on the outside of my home? Regularly fix the edges of walkways and driveways and fix any cracks. Remove anything that might make you trip as you walk through a door, such as a raised step or threshold. Trim any bushes or trees on the path to your home. Use bright outdoor lighting. Clear any walking paths of anything that might make someone trip, such as rocks or tools. Regularly check to see if handrails are loose or broken. Make sure that both sides of any steps have handrails. Any raised decks and porches should have guardrails on the edges. Have any leaves, snow, or ice cleared regularly. Use sand  or salt on walking paths during winter. Clean up any spills in your garage right away. This includes oil or grease spills. What can I do in the bathroom? Use night lights. Install grab bars by the toilet and in the tub and shower. Do not use towel bars as grab bars. Use non-skid mats or decals in the tub or shower. If you need to sit down in the shower, use a plastic, non-slip stool. Keep the floor dry. Clean up any water that spills on the floor as soon as it happens. Remove soap buildup in the tub or shower regularly. Attach bath mats securely with double-sided non-slip rug tape. Do not have throw rugs and other things on the floor that can make you trip. What can I do in the bedroom? Use night lights. Make sure that you have a light by your bed that is easy to reach. Do not use any sheets or blankets that are too big for your bed. They should not hang down onto the floor. Have a firm chair that has side arms. You can use this for support while you get dressed. Do not have throw rugs and other things on the floor that can make you trip. What can I do in the kitchen? Clean up any spills right away. Avoid walking on wet floors. Keep items that you use  a lot in easy-to-reach places. If you need to reach something above you, use a strong step stool that has a grab bar. Keep electrical cords out of the way. Do not use floor polish or wax that makes floors slippery. If you must use wax, use non-skid floor wax. Do not have throw rugs and other things on the floor that can make you trip. What can I do with my stairs? Do not leave any items on the stairs. Make sure that there are handrails on both sides of the stairs and use them. Fix handrails that are broken or loose. Make sure that handrails are as long as the stairways. Check any carpeting to make sure that it is firmly attached to the stairs. Fix any carpet that is loose or worn. Avoid having throw rugs at the top or bottom of the stairs.  If you do have throw rugs, attach them to the floor with carpet tape. Make sure that you have a light switch at the top of the stairs and the bottom of the stairs. If you do not have them, ask someone to add them for you. What else can I do to help prevent falls? Wear shoes that: Do not have high heels. Have rubber bottoms. Are comfortable and fit you well. Are closed at the toe. Do not wear sandals. If you use a stepladder: Make sure that it is fully opened. Do not climb a closed stepladder. Make sure that both sides of the stepladder are locked into place. Ask someone to hold it for you, if possible. Clearly mark and make sure that you can see: Any grab bars or handrails. First and last steps. Where the edge of each step is. Use tools that help you move around (mobility aids) if they are needed. These include: Canes. Walkers. Scooters. Crutches. Turn on the lights when you go into a dark area. Replace any light bulbs as soon as they burn out. Set up your furniture so you have a clear path. Avoid moving your furniture around. If any of your floors are uneven, fix them. If there are any pets around you, be aware of where they are. Review your medicines with your doctor. Some medicines can make you feel dizzy. This can increase your chance of falling. Ask your doctor what other things that you can do to help prevent falls. This information is not intended to replace advice given to you by your health care provider. Make sure you discuss any questions you have with your health care provider. Document Released: 05/12/2009 Document Revised: 12/22/2015 Document Reviewed: 08/20/2014 Elsevier Interactive Patient Education  2017 [employer].

## 2023-03-01 ENCOUNTER — Other Ambulatory Visit: Payer: Self-pay | Admitting: Family Medicine

## 2023-03-04 ENCOUNTER — Other Ambulatory Visit: Payer: Self-pay | Admitting: Family Medicine

## 2023-03-06 ENCOUNTER — Other Ambulatory Visit: Payer: Self-pay | Admitting: Family Medicine

## 2023-03-06 NOTE — Telephone Encounter (Signed)
Stacks NTBS last Diabetic FU 04/04/22

## 2023-03-06 NOTE — Telephone Encounter (Signed)
Left message for pt to call back to schedule appt

## 2023-03-08 MED ORDER — ONETOUCH DELICA PLUS LANCET33G MISC: 0 refills | Status: AC

## 2023-03-08 NOTE — Addendum Note (Signed)
Addended by: Julious Payer D on: 03/08/2023 12:19 PM   Modules accepted: Orders

## 2023-03-08 NOTE — Telephone Encounter (Signed)
Pt scheduled apt for 03/14/2023. Pt wants to know if he can get refills until apt.

## 2023-03-14 ENCOUNTER — Ambulatory Visit (INDEPENDENT_AMBULATORY_CARE_PROVIDER_SITE_OTHER): Payer: Medicare HMO | Admitting: Family Medicine

## 2023-03-14 ENCOUNTER — Telehealth: Payer: Self-pay | Admitting: Family Medicine

## 2023-03-14 ENCOUNTER — Encounter: Payer: Self-pay | Admitting: Family Medicine

## 2023-03-14 VITALS — BP 126/53 | HR 66 | Temp 98.2°F | Ht 71.0 in | Wt 243.6 lb

## 2023-03-14 DIAGNOSIS — D696 Thrombocytopenia, unspecified: Secondary | ICD-10-CM

## 2023-03-14 DIAGNOSIS — R350 Frequency of micturition: Secondary | ICD-10-CM

## 2023-03-14 DIAGNOSIS — Z7984 Long term (current) use of oral hypoglycemic drugs: Secondary | ICD-10-CM | POA: Diagnosis not present

## 2023-03-14 DIAGNOSIS — I10 Essential (primary) hypertension: Secondary | ICD-10-CM | POA: Diagnosis not present

## 2023-03-14 DIAGNOSIS — E1140 Type 2 diabetes mellitus with diabetic neuropathy, unspecified: Secondary | ICD-10-CM

## 2023-03-14 DIAGNOSIS — N401 Enlarged prostate with lower urinary tract symptoms: Secondary | ICD-10-CM

## 2023-03-14 DIAGNOSIS — I4891 Unspecified atrial fibrillation: Secondary | ICD-10-CM

## 2023-03-14 DIAGNOSIS — E782 Mixed hyperlipidemia: Secondary | ICD-10-CM | POA: Diagnosis not present

## 2023-03-14 DIAGNOSIS — E119 Type 2 diabetes mellitus without complications: Secondary | ICD-10-CM

## 2023-03-14 LAB — BAYER DCA HB A1C WAIVED: HB A1C (BAYER DCA - WAIVED): 7.2 % — ABNORMAL HIGH (ref 4.8–5.6)

## 2023-03-14 NOTE — Telephone Encounter (Signed)
PATIENT STATES HE WAS SUPPOSED TO LET DR Darlyn Read KNOW THAT THE MEDICATION THEY DISCUSSED WAS METOPROLOL

## 2023-03-14 NOTE — Progress Notes (Signed)
Subjective:  Patient ID: [name],  male    DOB: 08-27-1940  Age: 82 y.o.    CC: Medical Management of Chronic Issues   HPI [name] presents for  follow-up of hypertension. Patient has no history of headache chest pain or shortness of breath or recent cough. Patient also denies symptoms of TIA such as numbness weakness lateralizing. Patient denies side effects from medication. States taking it regularly.  Patient also  in for follow-up of elevated cholesterol. Doing well without complaints on current medication. Denies side effects  including myalgia and arthralgia and nausea. Also in today for liver function testing. Currently no chest pain, shortness of breath or other cardiovascular related symptoms noted.  Follow-up of diabetes. Patient denies symptoms such as excessive hunger or urinary frequency, excessive hunger, nausea No significant hypoglycemic spells noted. Medications reviewed. Pt reports taking them regularly. Pt. denies complication/adverse reaction today.   Hx of thrombocytopenia. Due recheck with lab. Denies bleeding.  BPH stable. 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   He has a past surgical history that includes Coronary stent placement (08/2006); Colonoscopy (9/17/20012); Polypectomy (09/17/20012); and Tonsillectomy.   His family history includes Cancer in his mother; Healthy in his son; Heart attack in his father; Heart disease in his father; Hypertension in his mother and sister; Irritable bowel syndrome in his maternal  grandmother.He reports that he quit smoking about 52 years ago. His smoking use included cigarettes. He quit smokeless tobacco use about 33 years ago.  His smokeless tobacco use included chew. He reports current alcohol use of about 2.0 standard drinks of alcohol per week. He reports that he does not use drugs.  No current facility-administered medications on file prior to visit.   Current Outpatient Medications on File Prior to Visit  Medication Sig Dispense Refill   atorvastatin (LIPITOR) 40 MG tablet Take 1 tablet (40 mg total) by mouth daily. 90 tablet 3   cyanocobalamin 1000 MCG tablet Take 1,000 mcg by mouth daily.     glimepiride (AMARYL) 2 MG tablet Take 2 mg by mouth at bedtime.     glimepiride (AMARYL) 4 MG tablet Take 4 mg by mouth daily with breakfast.     Glucose Blood (BLOOD GLUCOSE TEST STRIPS) STRP 1 each by In Vitro route in the morning, at noon, and at bedtime. May substitute to any manufacturer covered by patient's insurance. Dx: E11.40 300 strip 0   Lancets (ONETOUCH DELICA PLUS LANCET33G) MISC TEST 3 TIME DAILY DX E11.40 300 each 0   lisinopril (ZESTRIL) 40 MG tablet Take 20 mg by mouth daily.     MAGNESIUM PO Take 420 mg by mouth.     metformin (FORTAMET) 500 MG (OSM) 24 hr tablet Take 500 mg by mouth 2 (two) times daily with a meal.     metoprolol succinate (TOPROL-XL) 100 MG 24 hr tablet Take 1 tablet (100 mg total) by mouth daily. Take with or immediately following a meal. 90 tablet 3   Multiple Vitamins-Minerals (CENTRUM SILVER PO) Take 1 capsule by mouth daily.     nitroGLYCERIN (NITROSTAT) 0.4 MG SL tablet DISSOLVE 1 TABLET UNDER TONGUE AS NEEDED FOR CHEST PAIN 25 tablet prn  tamsulosin (FLOMAX) 0.4 MG CAPS capsule Take 2 capsules (0.8 mg total) by mouth daily after supper. TAKE 1 CAPSULE BY MOUTH EVERYDAY AT BEDTIME 180 capsule 2    ROS Review of Systems  Constitutional:  Negative for fever.  Respiratory:  Negative for shortness of breath.   Cardiovascular:   Negative for chest pain.  Musculoskeletal:  Negative for arthralgias.  Skin:  Negative for rash.    Objective:  BP (!) 126/53   Pulse 66   Temp 98.2 F (36.8 C)   Ht 5\' 11"  (1.803 m)   Wt 243 lb 9.6 oz (110.5 kg)   SpO2 98%   BMI 33.98 kg/m   BP Readings from Last 3 Encounters:  03/17/23 (!) 151/60  03/14/23 (!) 126/53  04/04/22 (!) 143/69    Wt Readings from Last 3 Encounters:  03/17/23 244 lb (110.7 kg)  03/14/23 243 lb 9.6 oz (110.5 kg)  02/28/23 240 lb (108.9 kg)     Physical Exam Vitals reviewed.  Constitutional:      Appearance: He is well-developed.  HENT:     Head: Normocephalic and atraumatic.     Right Ear: External ear normal.     Left Ear: External ear normal.     Mouth/Throat:     Pharynx: No oropharyngeal exudate or posterior oropharyngeal erythema.  Eyes:     Pupils: Pupils are equal, round, and reactive to light.  Cardiovascular:     Rate and Rhythm: Normal rate and regular rhythm.     Heart sounds: No murmur heard. Pulmonary:     Effort: No respiratory distress.     Breath sounds: Normal breath sounds.  Musculoskeletal:     Cervical back: Normal range of motion and neck supple.  Neurological:     Mental Status: He is alert and oriented to person, place, and time.     Diabetic Foot Exam - Simple   No data filed     Lab Results  Component Value Date   HGBA1C 7.2 (H) 03/14/2023   HGBA1C 6.4 (H) 04/04/2022   HGBA1C 6.3 (H) 01/01/2022    Assessment & Plan:   [name] was seen today for medical management of chronic issues.  Diagnoses and all orders for this visit:  Mixed hyperlipidemia -     Lipid panel  Essential hypertension -     CBC with Differential/Platelet -     CMP14+EGFR  Type 2 diabetes mellitus with diabetic neuropathy, without long-term current use of insulin (HCC) -     Bayer DCA Hb A1c Waived  Thrombocytopenia (HCC) -     B12 and Folate Panel  Atrial fibrillation, unspecified type (HCC)  Benign prostatic  hyperplasia with urinary frequency   I have discontinued [name]'s CVS Blood Pressure Monitor, rivaroxaban, lisinopril-hydrochlorothiazide, Blood Glucose Monitoring Suppl, and glipiZIDE. I am also having him maintain his Multiple Vitamins-Minerals (CENTRUM SILVER PO), cyanocobalamin, MAGNESIUM PO, nitroGLYCERIN, tamsulosin, atorvastatin, metoprolol succinate, BLOOD GLUCOSE TEST STRIPS, glimepiride, glimepiride, metformin, lisinopril, and OneTouch Delica Plus Lancet33G.  No orders of the defined types were placed in this encounter.    Follow-up: No follow-ups on file.  Mechele Claude, M.D.

## 2023-03-14 NOTE — Telephone Encounter (Signed)
Returned call, mailbox full

## 2023-03-17 ENCOUNTER — Inpatient Hospital Stay (HOSPITAL_COMMUNITY)
Admission: EM | Admit: 2023-03-17 | Discharge: 2023-03-20 | DRG: 812 | Disposition: A | Discharge status: Home / Self Care | Payer: Medicare HMO | Attending: Internal Medicine | Admitting: Internal Medicine

## 2023-03-17 ENCOUNTER — Emergency Department (HOSPITAL_COMMUNITY): Payer: Medicare HMO

## 2023-03-17 ENCOUNTER — Encounter: Payer: Self-pay | Admitting: Family Medicine

## 2023-03-17 DIAGNOSIS — E669 Obesity, unspecified: Secondary | ICD-10-CM | POA: Diagnosis present

## 2023-03-17 DIAGNOSIS — R531 Weakness: Secondary | ICD-10-CM | POA: Diagnosis not present

## 2023-03-17 DIAGNOSIS — I4891 Unspecified atrial fibrillation: Secondary | ICD-10-CM | POA: Diagnosis present

## 2023-03-17 DIAGNOSIS — K921 Melena: Secondary | ICD-10-CM

## 2023-03-17 DIAGNOSIS — Z87891 Personal history of nicotine dependence: Secondary | ICD-10-CM

## 2023-03-17 DIAGNOSIS — I2510 Atherosclerotic heart disease of native coronary artery without angina pectoris: Secondary | ICD-10-CM | POA: Diagnosis present

## 2023-03-17 DIAGNOSIS — Z79899 Other long term (current) drug therapy: Secondary | ICD-10-CM

## 2023-03-17 DIAGNOSIS — R0609 Other forms of dyspnea: Secondary | ICD-10-CM | POA: Diagnosis present

## 2023-03-17 DIAGNOSIS — I517 Cardiomegaly: Secondary | ICD-10-CM | POA: Diagnosis not present

## 2023-03-17 DIAGNOSIS — D509 Iron deficiency anemia, unspecified: Principal | ICD-10-CM | POA: Diagnosis present

## 2023-03-17 DIAGNOSIS — E785 Hyperlipidemia, unspecified: Secondary | ICD-10-CM | POA: Diagnosis present

## 2023-03-17 DIAGNOSIS — R0989 Other specified symptoms and signs involving the circulatory and respiratory systems: Secondary | ICD-10-CM | POA: Diagnosis not present

## 2023-03-17 DIAGNOSIS — E871 Hypo-osmolality and hyponatremia: Secondary | ICD-10-CM | POA: Diagnosis not present

## 2023-03-17 DIAGNOSIS — R791 Abnormal coagulation profile: Secondary | ICD-10-CM | POA: Diagnosis present

## 2023-03-17 DIAGNOSIS — R0602 Shortness of breath: Secondary | ICD-10-CM | POA: Diagnosis not present

## 2023-03-17 DIAGNOSIS — Z7984 Long term (current) use of oral hypoglycemic drugs: Secondary | ICD-10-CM

## 2023-03-17 DIAGNOSIS — D472 Monoclonal gammopathy: Secondary | ICD-10-CM | POA: Diagnosis present

## 2023-03-17 DIAGNOSIS — D649 Anemia, unspecified: Secondary | ICD-10-CM | POA: Diagnosis present

## 2023-03-17 DIAGNOSIS — Z7901 Long term (current) use of anticoagulants: Secondary | ICD-10-CM

## 2023-03-17 DIAGNOSIS — Z8249 Family history of ischemic heart disease and other diseases of the circulatory system: Secondary | ICD-10-CM

## 2023-03-17 DIAGNOSIS — D61818 Other pancytopenia: Secondary | ICD-10-CM | POA: Diagnosis present

## 2023-03-17 DIAGNOSIS — Z955 Presence of coronary angioplasty implant and graft: Secondary | ICD-10-CM

## 2023-03-17 DIAGNOSIS — I890 Lymphedema, not elsewhere classified: Secondary | ICD-10-CM | POA: Diagnosis present

## 2023-03-17 DIAGNOSIS — I10 Essential (primary) hypertension: Secondary | ICD-10-CM | POA: Diagnosis present

## 2023-03-17 DIAGNOSIS — G4733 Obstructive sleep apnea (adult) (pediatric): Secondary | ICD-10-CM | POA: Diagnosis present

## 2023-03-17 DIAGNOSIS — K760 Fatty (change of) liver, not elsewhere classified: Secondary | ICD-10-CM | POA: Diagnosis present

## 2023-03-17 DIAGNOSIS — R7989 Other specified abnormal findings of blood chemistry: Secondary | ICD-10-CM | POA: Insufficient documentation

## 2023-03-17 DIAGNOSIS — Z6832 Body mass index (BMI) 32.0-32.9, adult: Secondary | ICD-10-CM

## 2023-03-17 DIAGNOSIS — E1140 Type 2 diabetes mellitus with diabetic neuropathy, unspecified: Secondary | ICD-10-CM | POA: Diagnosis present

## 2023-03-17 LAB — BASIC METABOLIC PANEL
Anion gap: 6 (ref 5–15)
BUN: 15 mg/dL (ref 8–23)
CO2: 26 mmol/L (ref 22–32)
Calcium: 9.3 mg/dL (ref 8.9–10.3)
Chloride: 97 mmol/L — ABNORMAL LOW (ref 98–111)
Creatinine, Ser: 1.13 mg/dL (ref 0.61–1.24)
GFR, Estimated: >60 mL/min (ref >60)
Glucose, Bld: 147 mg/dL — ABNORMAL HIGH (ref 70–99)
Potassium: 5 mmol/L (ref 3.5–5.1)
Sodium: 129 mmol/L — ABNORMAL LOW (ref 135–145)

## 2023-03-17 LAB — IRON AND TIBC
Iron: 24 ug/dL — ABNORMAL LOW (ref 45–182)
Saturation Ratios: 4 % — ABNORMAL LOW (ref 17.9–39.5)
TIBC: 546 ug/dL — ABNORMAL HIGH (ref 250–450)
UIBC: 522 ug/dL

## 2023-03-17 LAB — FIBRINOGEN: Fibrinogen: 196 mg/dL — ABNORMAL LOW (ref 210–475)

## 2023-03-17 LAB — PREPARE RBC (CROSSMATCH)

## 2023-03-17 LAB — CBC WITH DIFFERENTIAL/PLATELET
Abs Immature Granulocytes: 0.01 10*3/uL (ref 0.00–0.07)
Basophils Absolute: 0 10*3/uL (ref 0.0–0.1)
Basophils Relative: 1 %
Eosinophils Absolute: 0 10*3/uL (ref 0.0–0.5)
Eosinophils Relative: 0 %
HCT: 23.8 % — ABNORMAL LOW (ref 39.0–52.0)
Hemoglobin: 7.3 g/dL — ABNORMAL LOW (ref 13.0–17.0)
Immature Granulocytes: 0 %
Lymphocytes Relative: 20 %
Lymphs Abs: 0.7 10*3/uL (ref 0.7–4.0)
MCH: 26.5 pg (ref 26.0–34.0)
MCHC: 30.7 g/dL (ref 30.0–36.0)
MCV: 86.5 fL (ref 80.0–100.0)
Monocytes Absolute: 0.4 10*3/uL (ref 0.1–1.0)
Monocytes Relative: 10 %
Neutro Abs: 2.6 10*3/uL (ref 1.7–7.7)
Neutrophils Relative %: 69 %
Platelets: 84 10*3/uL — ABNORMAL LOW (ref 150–400)
RBC: 2.75 MIL/uL — ABNORMAL LOW (ref 4.22–5.81)
RDW: 14.7 % (ref 11.5–15.5)
WBC: 3.7 10*3/uL — ABNORMAL LOW (ref 4.0–10.5)
nRBC: 0 % (ref 0.0–0.2)

## 2023-03-17 LAB — URINALYSIS, ROUTINE W REFLEX MICROSCOPIC
Bilirubin Urine: NEGATIVE
Glucose, UA: NEGATIVE mg/dL
Hgb urine dipstick: NEGATIVE
Ketones, ur: NEGATIVE mg/dL
Leukocytes,Ua: NEGATIVE
Nitrite: NEGATIVE
Protein, ur: NEGATIVE mg/dL
Specific Gravity, Urine: 1.004 — ABNORMAL LOW (ref 1.005–1.030)
pH: 6 (ref 5.0–8.0)

## 2023-03-17 LAB — HEPATIC FUNCTION PANEL
ALT: 22 U/L (ref 0–44)
AST: 31 U/L (ref 15–41)
Albumin: 4 g/dL (ref 3.5–5.0)
Alkaline Phosphatase: 93 U/L (ref 38–126)
Bilirubin, Direct: 0.2 mg/dL (ref 0.0–0.2)
Indirect Bilirubin: 0.7 mg/dL (ref 0.3–0.9)
Total Bilirubin: 0.9 mg/dL (ref 0.3–1.2)
Total Protein: 7.3 g/dL (ref 6.5–8.1)

## 2023-03-17 LAB — RETICULOCYTES
Immature Retic Fract: 18.4 % — ABNORMAL HIGH (ref 2.3–15.9)
RBC.: 2.83 MIL/uL — ABNORMAL LOW (ref 4.22–5.81)
Retic Count, Absolute: 59.1 10*3/uL (ref 19.0–186.0)
Retic Ct Pct: 2.1 % (ref 0.4–3.1)

## 2023-03-17 LAB — POC OCCULT BLOOD, ED: Fecal Occult Bld: NEGATIVE

## 2023-03-17 LAB — FERRITIN: Ferritin: 6 ng/mL — ABNORMAL LOW (ref 24–336)

## 2023-03-17 LAB — PROTIME-INR
INR: 1.4 — ABNORMAL HIGH (ref 0.8–1.2)
Prothrombin Time: 17.7 seconds — ABNORMAL HIGH (ref 11.4–15.2)

## 2023-03-17 LAB — ABO/RH: ABO/RH(D): O POS

## 2023-03-17 LAB — D-DIMER, QUANTITATIVE: D-Dimer, Quant: 1.47 ug{FEU}/mL — ABNORMAL HIGH (ref 0.00–0.50)

## 2023-03-17 MED ORDER — SODIUM CHLORIDE 0.9% IV SOLUTION: Freq: Once | INTRAVENOUS | Status: DC

## 2023-03-17 MED ORDER — SODIUM CHLORIDE 0.9 % IV BOLUS
500.0000 mL | Freq: Once | INTRAVENOUS | Status: AC
  Administered 2023-03-17: 500 mL via INTRAVENOUS

## 2023-03-17 NOTE — ED Notes (Signed)
ED Provider at bedside. 

## 2023-03-17 NOTE — ED Provider Notes (Signed)
[place] EMERGENCY DEPARTMENT AT [facility] Provider Note   CSN: [id number] Arrival date & time: 03/17/23  1342     History {Add pertinent medical, surgical, social history, OB history to HPI:1} Chief Complaint  Patient presents with   abnormal labs    [name] is a 82 y.o. male with PMH as listed below who presents stating that he was sent here by Dr. Darlyn Read for a blood transfusion. Pt stated that he was called early last night by his Dr office telling him that his Hgb was 6.5.  He was seen by Dr. Dorita Sciara on 03/14/2023 for irregular medical management hypertension, diabetes, and cholesterol. Has h/o afib but does not take a blood thinner. No hematochezia that he is noted.  However he states that he thinks his stool has been pretty black for the last 2 to 3 months.  He states he recently had an endoscopy at the [place] a couple of weeks ago to "monitor a sore in his stomach" but he is not aware of the findings and they did not tell him anything was abnormal.  He denies any abdominal pain but does endorse some dyspnea on exertion that is chronic for him as well as fatigue/generalized weakness.  Denies chest pain, fevers chills, recent illnesses, urinary symptoms, diarrhea constipation, lower extremity edema, yellowing of the skin, epistaxis, bleeding of the gums, hemoptysis, hematuria. No reported excessive EtOH use.  Per chart review he had labs on 03/14/23 with folate 19.1 and B12 1,480, not low.  Past Medical History:  Diagnosis Date   Coronary atherosclerosis of unspecified type of vessel, native or graft    Diabetes (HCC)    Diverticulosis    Educated about COVID-19 virus infection 12/17/2018   ETD (eustachian tube dysfunction) 07/07/2013   Hepatomegaly    Hx of colonic polyps    Metabolic syndrome 11/11/2012   Obesity, unspecified    Obstructive sleep apnea    Uses CPAP.    Other and unspecified hyperlipidemia    Unspecified essential hypertension        Home  Medications Prior to Admission medications   Medication Sig Start Date End Date Taking? Authorizing Provider  atorvastatin (LIPITOR) 40 MG tablet Take 1 tablet (40 mg total) by mouth daily. 09/28/21   Mechele Claude, MD  cyanocobalamin 1000 MCG tablet Take 1,000 mcg by mouth daily.    [provider]  glimepiride (AMARYL) 2 MG tablet Take 2 mg by mouth at bedtime.    [provider]  glimepiride (AMARYL) 4 MG tablet Take 4 mg by mouth daily with breakfast.    [provider]  Glucose Blood (BLOOD GLUCOSE TEST STRIPS) STRP 1 each by In Vitro route in the morning, at noon, and at bedtime. May substitute to any manufacturer covered by patient's insurance. Dx: E11.40 11/29/22   Mechele Claude, MD  Lancets [facility] DELICA PLUS [facility]) MISC TEST 3 TIME DAILY DX E11.40 03/08/23   Mechele Claude, MD  lisinopril (ZESTRIL) 40 MG tablet Take 20 mg by mouth daily.    [provider]  MAGNESIUM PO Take 420 mg by mouth.    [provider]  metformin (FORTAMET) 500 MG (OSM) 24 hr tablet Take 500 mg by mouth 2 (two) times daily with a meal.    [provider]  metoprolol succinate (TOPROL-XL) 100 MG 24 hr tablet Take 1 tablet (100 mg total) by mouth daily. Take with or immediately following a meal. 04/04/22   Mechele Claude, MD  Multiple Vitamins-Minerals (CENTRUM SILVER PO) Take 1 capsule by mouth daily.    [provider]  nitroGLYCERIN (NITROSTAT) 0.4 MG SL tablet DISSOLVE 1 TABLET UNDER TONGUE AS NEEDED FOR CHEST PAIN 12/08/20   Mechele Claude, MD  tamsulosin (FLOMAX) 0.4 MG CAPS capsule Take 2 capsules (0.8 mg total) by mouth daily after supper. TAKE 1 CAPSULE BY MOUTH EVERYDAY AT BEDTIME 09/28/21   Mechele Claude, MD      Allergies    Patient has no known allergies.    Review of Systems   Review of Systems A 10 point review of systems was performed and is negative unless otherwise reported in HPI.  Physical Exam Updated Vital Signs BP 136/70    Pulse 65   Temp 97.9 F (36.6 C) (Oral)   Resp 20   Ht 5\' 11"  (1.803 m)   Wt 110.7 kg   SpO2 98%   BMI 34.03 kg/m  Physical Exam General: Normal appearing elderly male, lying in bed.  HEENT: PERRLA, Sclera anicteric, MMM, trachea midline.  Cardiology: RRR, no murmurs/rubs/gallops. Resp: Normal respiratory rate and effort. CTAB, no wheezes, rhonchi, crackles.  Abd: Soft, non-tender, non-distended. No rebound tenderness or guarding.  Rectal: Performed with RN chaperone ***.  MSK: No peripheral edema or signs of trauma. Extremities without deformity or TTP. No cyanosis or clubbing. Skin: warm, dry. No jaundice. Neuro: A&Ox4, CNs II-XII grossly intact. MAEs. Sensation grossly intact.  Psych: Normal mood and affect.   ED Results / Procedures / Treatments   Labs (all labs ordered are listed, but only abnormal results are displayed) Labs Reviewed  CBC WITH DIFFERENTIAL/PLATELET - Abnormal; Notable for the following components:      Result Value   WBC 3.7 (*)    RBC 2.75 (*)    Hemoglobin 7.3 (*)    HCT 23.8 (*)    Platelets 84 (*)    All other components within normal limits  BASIC METABOLIC PANEL - Abnormal; Notable for the following components:   Sodium 129 (*)    Chloride 97 (*)    Glucose, Bld 147 (*)    All other components within normal limits  PROTIME-INR - Abnormal; Notable for the following components:   Prothrombin Time 17.7 (*)    INR 1.4 (*)    All other components within normal limits  URINALYSIS, ROUTINE W REFLEX MICROSCOPIC - Abnormal; Notable for the following components:   Color, Urine STRAW (*)    Specific Gravity, Urine 1.004 (*)    All other components within normal limits  FERRITIN - Abnormal; Notable for the following components:   Ferritin 6 (*)    All other components within normal limits  IRON AND TIBC - Abnormal; Notable for the following components:   Iron 24 (*)    TIBC 546 (*)    Saturation Ratios 4 (*)    All other components within normal  limits  RETICULOCYTES - Abnormal; Notable for the following components:   RBC. 2.83 (*)    Immature Retic Fract 18.4 (*)    All other components within normal limits  HEPATIC FUNCTION PANEL  HAPTOGLOBIN  FLOW CYTOMETRY  FIBRINOGEN  TECHNOLOGIST SMEAR REVIEW  POC OCCULT BLOOD, ED  TYPE AND SCREEN  PREPARE RBC (CROSSMATCH)  ABO/RH    EKG None  Radiology No results found.  Procedures Procedures  {Document cardiac monitor, telemetry assessment procedure when appropriate:1}  Medications Ordered in ED Medications  0.9 %  sodium chloride infusion (Manually program via Guardrails IV Fluids) (has no  administration in time range)  sodium chloride 0.9 % bolus 500 mL (0 mLs Intravenous Stopped 03/17/23 2026)    ED Course/ Medical Decision Making/ A&P                          Medical Decision Making Amount and/or Complexity of Data Reviewed Labs: ordered. Decision-making details documented in ED Course. Radiology: ordered.  Risk Prescription drug management.    This patient presents to the ED for concern of anemia, possible melena; this involves an extensive number of treatment options, and is a complaint that carries with it a high risk of complications and morbidity.  I considered the following differential and admission for this acute, potentially life threatening condition.   Medical Decision Making: Consider significant/severe and potential life-threatening etiologies including, but not limited to: acute blood loss, iron deficiency anemia, anemia of chronic disease, possible bone marrow suppression, drug-induced anemia.  Patient does report possible melena, will get fecal occult to assess for blood loss anemia. Anemia though appears to be normocytic. Anemia is not macrocytic, and had normal folate/B12 levels three days ago. He does not have known liver or renal disease, Cr today 1.13, less likely ACD. Will get HFP to assess for hemolysis/liver disease and reticulocytes and  iron studies prior to transfusion for further evaluation. Patient consented for PRBCs, ordered 1U.     Clinical Course as of 03/17/23 2045  Sun Mar 17, 2023  1855 HFP wnl. Low iron, high TIBC, low ferritin. Likely IDA. Reticulocyte index is 0.6, which does indicate hypoproliferation, and patient also has thrombocytopenia to 84 and mild leukopenia.  Has had thrombocytopenia for years and this value today is unchanged. POC stool occult pending.  [HN]  1950 Fecal Occult Blood, POC: NEGATIVE W/ negative POC stool occult and hypoproliferative RI, concern for primary hematologic/oncologic process. He doesn't have lymphocytosis or lymphadenopathy to indicate leukemia. Consider aplastic anemia, myelodysplastic syndrome. Added smear/flow cytometry for prior to transfusion. Will give PRBCs now and reassess. Patient likely will be able to follow up outpatient for further w/u. He has no fever or signs of infection. No petechiae or active bleeding to indicate platelet transfusion. [HN]  1950 Urinalysis, Routine w reflex microscopic -Urine, Clean Catch(!) No hematuria, no UTI [HN]    Clinical Course User Index [HN] Loetta Rough, MD    Labs: I Ordered, and personally interpreted labs.  The pertinent results include:  those listed above  Imaging Studies ordered: I ordered imaging studies including CXR I independently visualized and interpreted imaging. I agree with the radiologist interpretation  Additional history obtained from chart review, wife at bedside.    Cardiac Monitoring: The patient was maintained on a cardiac monitor.  I personally viewed and interpreted the cardiac monitored which showed an underlying rhythm of: NSR  Reevaluation: After the interventions noted above, I reevaluated the patient and found that they have :{resolved/improved/worsened:23923::"improved"}  Social Determinants of Health: Lives independently  Disposition:  ***  Co morbidities that complicate the patient  evaluation  Past Medical History:  Diagnosis Date   Coronary atherosclerosis of unspecified type of vessel, native or graft    Diabetes (HCC)    Diverticulosis    Educated about COVID-19 virus infection 12/17/2018   ETD (eustachian tube dysfunction) 07/07/2013   Hepatomegaly    Hx of colonic polyps    Metabolic syndrome 11/11/2012   Obesity, unspecified    Obstructive sleep apnea    Uses CPAP.    Other and  unspecified hyperlipidemia    Unspecified essential hypertension      Medicines Meds ordered this encounter  Medications   0.9 %  sodium chloride infusion (Manually program via Guardrails IV Fluids)   sodium chloride 0.9 % bolus 500 mL    I have reviewed the patients home medicines and have made adjustments as needed  Problem List / ED Course: Problem List Items Addressed This Visit   None        {Document critical care time when appropriate:1} {Document review of labs and clinical decision tools ie heart score, Chads2Vasc2 etc:1}  {Document your independent review of radiology images, and any outside records:1} {Document your discussion with family members, caretakers, and with consultants:1} {Document social determinants of health affecting pt's care:1} {Document your decision making why or why not admission, treatments were needed:1}  This note was created using dictation software, which may contain spelling or grammatical errors.

## 2023-03-17 NOTE — ED Triage Notes (Signed)
Pt stated that he was sent here by Dr. Darlyn Read for a blood transfusion. Pt stated that he was called early last night by his Dr office telling him that his blood count was 6.5.

## 2023-03-18 ENCOUNTER — Encounter (HOSPITAL_COMMUNITY): Payer: Self-pay | Admitting: Family Medicine

## 2023-03-18 ENCOUNTER — Other Ambulatory Visit: Payer: Self-pay

## 2023-03-18 DIAGNOSIS — D649 Anemia, unspecified: Secondary | ICD-10-CM

## 2023-03-18 DIAGNOSIS — I890 Lymphedema, not elsewhere classified: Secondary | ICD-10-CM | POA: Diagnosis not present

## 2023-03-18 DIAGNOSIS — Z8249 Family history of ischemic heart disease and other diseases of the circulatory system: Secondary | ICD-10-CM | POA: Diagnosis not present

## 2023-03-18 DIAGNOSIS — K760 Fatty (change of) liver, not elsewhere classified: Secondary | ICD-10-CM | POA: Diagnosis not present

## 2023-03-18 DIAGNOSIS — I10 Essential (primary) hypertension: Secondary | ICD-10-CM | POA: Diagnosis not present

## 2023-03-18 DIAGNOSIS — E782 Mixed hyperlipidemia: Secondary | ICD-10-CM

## 2023-03-18 DIAGNOSIS — I2510 Atherosclerotic heart disease of native coronary artery without angina pectoris: Secondary | ICD-10-CM | POA: Diagnosis not present

## 2023-03-18 DIAGNOSIS — D472 Monoclonal gammopathy: Secondary | ICD-10-CM | POA: Diagnosis not present

## 2023-03-18 DIAGNOSIS — D61818 Other pancytopenia: Secondary | ICD-10-CM | POA: Diagnosis not present

## 2023-03-18 DIAGNOSIS — G4733 Obstructive sleep apnea (adult) (pediatric): Secondary | ICD-10-CM | POA: Diagnosis not present

## 2023-03-18 DIAGNOSIS — Z6832 Body mass index (BMI) 32.0-32.9, adult: Secondary | ICD-10-CM | POA: Diagnosis not present

## 2023-03-18 DIAGNOSIS — Z87891 Personal history of nicotine dependence: Secondary | ICD-10-CM | POA: Diagnosis not present

## 2023-03-18 DIAGNOSIS — E669 Obesity, unspecified: Secondary | ICD-10-CM | POA: Diagnosis not present

## 2023-03-18 DIAGNOSIS — I4891 Unspecified atrial fibrillation: Secondary | ICD-10-CM

## 2023-03-18 DIAGNOSIS — E1140 Type 2 diabetes mellitus with diabetic neuropathy, unspecified: Secondary | ICD-10-CM | POA: Diagnosis not present

## 2023-03-18 DIAGNOSIS — R791 Abnormal coagulation profile: Secondary | ICD-10-CM | POA: Diagnosis not present

## 2023-03-18 DIAGNOSIS — D509 Iron deficiency anemia, unspecified: Secondary | ICD-10-CM | POA: Diagnosis not present

## 2023-03-18 DIAGNOSIS — R7989 Other specified abnormal findings of blood chemistry: Secondary | ICD-10-CM | POA: Diagnosis not present

## 2023-03-18 DIAGNOSIS — Z7901 Long term (current) use of anticoagulants: Secondary | ICD-10-CM | POA: Diagnosis not present

## 2023-03-18 DIAGNOSIS — E871 Hypo-osmolality and hyponatremia: Secondary | ICD-10-CM | POA: Diagnosis not present

## 2023-03-18 DIAGNOSIS — Z79899 Other long term (current) drug therapy: Secondary | ICD-10-CM | POA: Diagnosis not present

## 2023-03-18 DIAGNOSIS — E6609 Other obesity due to excess calories: Secondary | ICD-10-CM | POA: Diagnosis not present

## 2023-03-18 DIAGNOSIS — K921 Melena: Secondary | ICD-10-CM | POA: Diagnosis not present

## 2023-03-18 DIAGNOSIS — Z7984 Long term (current) use of oral hypoglycemic drugs: Secondary | ICD-10-CM | POA: Diagnosis not present

## 2023-03-18 DIAGNOSIS — E785 Hyperlipidemia, unspecified: Secondary | ICD-10-CM | POA: Diagnosis not present

## 2023-03-18 DIAGNOSIS — Z955 Presence of coronary angioplasty implant and graft: Secondary | ICD-10-CM | POA: Diagnosis not present

## 2023-03-18 DIAGNOSIS — R0609 Other forms of dyspnea: Secondary | ICD-10-CM | POA: Diagnosis not present

## 2023-03-18 DIAGNOSIS — Z6830 Body mass index (BMI) 30.0-30.9, adult: Secondary | ICD-10-CM | POA: Diagnosis not present

## 2023-03-18 LAB — CBC WITH DIFFERENTIAL/PLATELET
Abs Immature Granulocytes: 0.01 10*3/uL (ref 0.00–0.07)
Basophils Absolute: 0 10*3/uL (ref 0.0–0.2)
Basophils Absolute: 0 10*3/uL (ref 0.0–0.1)
Basophils Relative: 0 %
Basos: 0 %
EOS (ABSOLUTE): 0 10*3/uL (ref 0.0–0.4)
Eos: 0 %
Eosinophils Absolute: 0 10*3/uL (ref 0.0–0.5)
Eosinophils Relative: 1 %
HCT: 24.7 % — ABNORMAL LOW (ref 39.0–52.0)
Hematocrit: 23.1 % — ABNORMAL LOW (ref 37.5–51.0)
Hemoglobin: 6.9 g/dL — CL (ref 13.0–17.7)
Hemoglobin: 7.5 g/dL — ABNORMAL LOW (ref 13.0–17.0)
Immature Grans (Abs): 0 10*3/uL (ref 0.0–0.1)
Immature Granulocytes: 0 %
Immature Granulocytes: 0 %
Lymphocytes Absolute: 0.6 10*3/uL — ABNORMAL LOW (ref 0.7–3.1)
Lymphocytes Relative: 20 %
Lymphs Abs: 0.6 10*3/uL — ABNORMAL LOW (ref 0.7–4.0)
Lymphs: 20 %
MCH: 25.4 pg — ABNORMAL LOW (ref 26.6–33.0)
MCH: 26.3 pg (ref 26.0–34.0)
MCHC: 29.9 g/dL — ABNORMAL LOW (ref 31.5–35.7)
MCHC: 30.4 g/dL (ref 30.0–36.0)
MCV: 85 fL (ref 79–97)
MCV: 86.7 fL (ref 80.0–100.0)
Monocytes Absolute: 0.4 10*3/uL (ref 0.1–0.9)
Monocytes Absolute: 0.3 10*3/uL (ref 0.1–1.0)
Monocytes Relative: 11 %
Monocytes: 11 %
Neutro Abs: 2 10*3/uL (ref 1.7–7.7)
Neutrophils Absolute: 2.2 10*3/uL (ref 1.4–7.0)
Neutrophils Relative %: 68 %
Neutrophils: 69 %
Platelets: 80 10*3/uL — CL (ref 150–450)
Platelets: 77 10*3/uL — ABNORMAL LOW (ref 150–400)
RBC: 2.72 x10E6/uL — CL (ref 4.14–5.80)
RBC: 2.85 MIL/uL — ABNORMAL LOW (ref 4.22–5.81)
RDW: 14.4 % (ref 11.6–15.4)
RDW: 14.7 % (ref 11.5–15.5)
WBC: 3.2 10*3/uL — ABNORMAL LOW (ref 3.4–10.8)
WBC: 3 10*3/uL — ABNORMAL LOW (ref 4.0–10.5)
nRBC: 0 % (ref 0.0–0.2)

## 2023-03-18 LAB — COMPREHENSIVE METABOLIC PANEL
ALT: 19 U/L (ref 0–44)
AST: 26 U/L (ref 15–41)
Albumin: 3.6 g/dL (ref 3.5–5.0)
Alkaline Phosphatase: 75 U/L (ref 38–126)
Anion gap: 7 (ref 5–15)
BUN: 15 mg/dL (ref 8–23)
CO2: 24 mmol/L (ref 22–32)
Calcium: 9.1 mg/dL (ref 8.9–10.3)
Chloride: 100 mmol/L (ref 98–111)
Creatinine, Ser: 0.95 mg/dL (ref 0.61–1.24)
GFR, Estimated: >60 mL/min (ref >60)
Glucose, Bld: 151 mg/dL — ABNORMAL HIGH (ref 70–99)
Potassium: 4.5 mmol/L (ref 3.5–5.1)
Sodium: 131 mmol/L — ABNORMAL LOW (ref 135–145)
Total Bilirubin: 1.2 mg/dL (ref 0.3–1.2)
Total Protein: 6.4 g/dL — ABNORMAL LOW (ref 6.5–8.1)

## 2023-03-18 LAB — CMP14+EGFR
ALT: 16 IU/L (ref 0–44)
AST: 30 IU/L (ref 0–40)
Albumin: 4.1 g/dL (ref 3.7–4.7)
Alkaline Phosphatase: 104 IU/L (ref 44–121)
BUN/Creatinine Ratio: 14 (ref 10–24)
BUN: 15 mg/dL (ref 8–27)
Bilirubin Total: 0.6 mg/dL (ref 0.0–1.2)
CO2: 19 mmol/L — ABNORMAL LOW (ref 20–29)
Calcium: 9 mg/dL (ref 8.6–10.2)
Chloride: 96 mmol/L (ref 96–106)
Creatinine, Ser: 1.06 mg/dL (ref 0.76–1.27)
Globulin, Total: 2.4 g/dL (ref 1.5–4.5)
Glucose: 162 mg/dL — ABNORMAL HIGH (ref 70–99)
Potassium: 4.9 mmol/L (ref 3.5–5.2)
Sodium: 133 mmol/L — ABNORMAL LOW (ref 134–144)
Total Protein: 6.5 g/dL (ref 6.0–8.5)
eGFR: 71 mL/min/{1.73_m2} (ref >59)

## 2023-03-18 LAB — LIPID PANEL
Chol/HDL Ratio: 2.1 ratio (ref 0.0–5.0)
Cholesterol, Total: 81 mg/dL — ABNORMAL LOW (ref 100–199)
HDL: 39 mg/dL — ABNORMAL LOW (ref >39)
LDL Chol Calc (NIH): 25 mg/dL (ref 0–99)
Triglycerides: 85 mg/dL (ref 0–149)
VLDL Cholesterol Cal: 17 mg/dL (ref 5–40)

## 2023-03-18 LAB — TECHNOLOGIST SMEAR REVIEW: Plt Morphology: DECREASED

## 2023-03-18 LAB — MAGNESIUM: Magnesium: 1.8 mg/dL (ref 1.7–2.4)

## 2023-03-18 LAB — PREPARE RBC (CROSSMATCH)

## 2023-03-18 LAB — B12 AND FOLATE PANEL
Folate: 19.1 ng/mL (ref >3.0)
Vitamin B-12: 1480 pg/mL — ABNORMAL HIGH (ref 232–1245)

## 2023-03-18 LAB — TSH: TSH: 2.118 u[IU]/mL (ref 0.350–4.500)

## 2023-03-18 MED ORDER — ONDANSETRON HCL 4 MG PO TABS: 4.0000 mg | ORAL_TABLET | Freq: Four times a day (QID) | ORAL | Status: DC | PRN

## 2023-03-18 MED ORDER — LISINOPRIL 10 MG PO TABS
20.0000 mg | ORAL_TABLET | Freq: Every day | ORAL | Status: DC
  Administered 2023-03-18 – 2023-03-20 (×3): 20 mg via ORAL
  Filled 2023-03-18 (×3): qty 2

## 2023-03-18 MED ORDER — METOPROLOL SUCCINATE ER 50 MG PO TB24
100.0000 mg | ORAL_TABLET | Freq: Every day | ORAL | Status: DC
  Administered 2023-03-18 – 2023-03-20 (×3): 100 mg via ORAL
  Filled 2023-03-18 (×3): qty 2

## 2023-03-18 MED ORDER — OXYCODONE HCL 5 MG PO TABS: 5.0000 mg | ORAL_TABLET | ORAL | Status: DC | PRN

## 2023-03-18 MED ORDER — ONDANSETRON HCL 4 MG/2ML IJ SOLN: 4.0000 mg | Freq: Four times a day (QID) | INTRAMUSCULAR | Status: DC | PRN

## 2023-03-18 MED ORDER — PANTOPRAZOLE SODIUM 40 MG IV SOLR
40.0000 mg | INTRAVENOUS | Status: DC
  Administered 2023-03-18 – 2023-03-19 (×2): 40 mg via INTRAVENOUS
  Filled 2023-03-18 (×2): qty 10

## 2023-03-18 MED ORDER — ATORVASTATIN CALCIUM 40 MG PO TABS
40.0000 mg | ORAL_TABLET | Freq: Every day | ORAL | Status: DC
  Administered 2023-03-18 – 2023-03-20 (×3): 40 mg via ORAL
  Filled 2023-03-18 (×3): qty 1

## 2023-03-18 MED ORDER — SODIUM CHLORIDE 0.9% IV SOLUTION: Freq: Once | INTRAVENOUS | Status: AC

## 2023-03-18 MED ORDER — ACETAMINOPHEN 650 MG RE SUPP: 650.0000 mg | Freq: Four times a day (QID) | RECTAL | Status: DC | PRN

## 2023-03-18 MED ORDER — ACETAMINOPHEN 325 MG PO TABS: 650.0000 mg | ORAL_TABLET | Freq: Four times a day (QID) | ORAL | Status: DC | PRN

## 2023-03-18 MED ORDER — FUROSEMIDE 10 MG/ML IJ SOLN
20.0000 mg | Freq: Once | INTRAMUSCULAR | Status: AC
  Administered 2023-03-18: 20 mg via INTRAVENOUS
  Filled 2023-03-18: qty 2

## 2023-03-18 MED ORDER — MORPHINE SULFATE (PF) 2 MG/ML IV SOLN: 2.0000 mg | INTRAVENOUS | Status: DC | PRN

## 2023-03-18 NOTE — Progress Notes (Signed)
Mobility Specialist Progress Note:    03/18/23 1120  Mobility  Activity Ambulated with assistance to bathroom  Level of Assistance Modified independent, requires aide device or extra time  Assistive Device Other (Comment) (IVpole)  Distance Ambulated (ft) 20 ft  Range of Motion/Exercises Active;All extremities  Activity Response Tolerated well  Mobility Referral Yes  $Mobility charge 1 Mobility  Mobility Specialist Start Time (ACUTE ONLY) 1120  Mobility Specialist Stop Time (ACUTE ONLY) 1125  Mobility Specialist Time Calculation (min) (ACUTE ONLY) 5 min   Pt received ambulating from BR using ModI with IV pole. Tolerated well, asx throughout. Pt seen ambulating back to bed,family member at bedside. All needs meet.   Lawerance Bach Mobility Specialist Please contact via Special educational needs teacher or  Rehab office at [phone]

## 2023-03-18 NOTE — Progress Notes (Signed)
Total of 3 units of PRBC given within the last 24 hours. Tolerated infusions well. Patient has been up to bathroom with assist, steady gait. Telemetry discontinued per MD order this shift. Patient aware he is schedule for EGD tomorrow and will be NPO after midnight tonight.

## 2023-03-18 NOTE — Assessment & Plan Note (Addendum)
-  Resume home hypoglycemic regimen -Continue to follow CBG fluctuation/A1c with further adjustment to blood sugar therapy as required.

## 2023-03-18 NOTE — TOC CM/SW Note (Signed)
Transition of Care [facility]) - Inpatient Brief Assessment   Patient Details  Name: [name] MRN: [id number] Date of Birth: [birth year]  Transition of Care [facility]) CM/SW Contact:    Villa Herb, LCSWA Phone Number: 03/18/2023, 11:08 AM   Clinical Narrative:  Transition of Care Department [facility]) has reviewed patient and no TOC needs have been identified at this time. We will continue to monitor patient advancement through interdisciplinary progression rounds. If new patient transition needs arise, please place a TOC consult.    Transition of Care Asessment: Insurance and Status: Insurance coverage has been reviewed Patient has primary care physician: Yes Home environment has been reviewed: from home Prior level of function:: independent Prior/Current Home Services: No current home services Social Determinants of Health Reivew: SDOH reviewed no interventions necessary Readmission risk has been reviewed: Yes Transition of care needs: no transition of care needs at this time

## 2023-03-18 NOTE — Consult Note (Signed)
Gastroenterology Consult   Referring Provider: Dr. Gwenlyn Perking Primary Care Physician:  Mechele Claude, MD Primary Gastroenterologist:  previously seen at [facility]  Patient ID: [name]; [id number]; [birth year]   Admit date: 03/17/2023  LOS: 0 days   Date of Consultation: 03/18/2023  Reason for Consultation:  anemia  History of Present Illness   [name] is an 82 y.o. year old male with history of CAD, DM, HTN, hepatic steatosis, OSA, reporting to the ED after PCP recommended evaluation due to Hgb 6.9 on 03/14/23 (outpatient labs). On presentation to the ED, Hgb was 7.3. We do not have prior labs to review, as last on file was from one year ago with Hgb 12.3. He has chronic thrombocytopenia but denies cirrhosis; he states he was told he was "on the verge". 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   Heme negative on admission. Denies any overt GI bleeding. States he had an EGD within the past month at the [facility] [place] to follow-up on a "small lesion". He states nothing had changed. Colonoscopy earlier this year and denies any polyps. He denies being told he has a history of anemia. He is on anticoagulation but unclear if Xarelto or Eliquis, as patient states it was changed from one to the other 2 weeks ago by Cardiologist. Last dose of anticoagulation was yesterday morning. No abdominal pain, N/V. He states he was told he needed to have a capsule. No shortness of breath at rest but does note DOE. Nod dysphagia. Reports good appetite.   Iron studies with profoundly low ferritin at 6. Total of 3 units PRBCs have been ordered.       EGD Aug 2016 by Dr. Marina Goodell: normal Colonoscopy Aug 2016: 3 polyps in sigmoid and transverse colon. Adenoma and hyperplastic.   Past Medical History:  Diagnosis Date   Coronary atherosclerosis of unspecified type of vessel, native or graft    Diabetes (HCC)    Diverticulosis    Educated about  COVID-19 virus infection 12/17/2018   ETD (eustachian tube dysfunction) 07/07/2013   Hepatomegaly    Hx of colonic polyps    Metabolic syndrome 11/11/2012   Obesity, unspecified    Obstructive sleep apnea    Uses CPAP.    Other and unspecified hyperlipidemia    Unspecified essential hypertension     Past Surgical History:  Procedure Laterality Date   COLONOSCOPY  9/17/20012   CORONARY STENT PLACEMENT  08/2006   LAD   POLYPECTOMY  09/17/20012   TONSILLECTOMY      Prior to Admission medications   Medication Sig Start Date End Date Taking? Authorizing Provider  atorvastatin (LIPITOR) 40 MG tablet Take 1 tablet (40 mg total) by mouth daily. 09/28/21  Yes Stacks, Broadus John, MD  glimepiride (AMARYL) 2 MG tablet Take 2 mg by mouth at bedtime.   Yes [provider]  glimepiride (AMARYL) 4 MG tablet Take 4 mg by mouth daily with breakfast.   Yes [provider]  lisinopril (ZESTRIL) 40 MG tablet Take 20 mg by mouth 2 (two) times daily.   Yes [provider]  metformin (FORTAMET) 500 MG (OSM) 24 hr tablet Take 500 mg by mouth 2 (two) times daily with a meal.   Yes [provider]  metoprolol succinate (TOPROL-XL) 100 MG 24 hr tablet Take 1 tablet (100 mg total) by mouth daily. Take with or immediately following a meal. 04/04/22  Yes Mechele Claude, MD  Multiple Vitamins-Minerals (CENTRUM SILVER PO) Take 1 capsule by mouth daily.   Yes [provider]  nitroGLYCERIN (NITROSTAT) 0.4 MG SL tablet DISSOLVE 1 TABLET UNDER TONGUE AS NEEDED FOR CHEST PAIN 12/08/20  Yes Mechele Claude, MD  tamsulosin (FLOMAX) 0.4 MG CAPS capsule Take 2 capsules (0.8 mg total) by mouth daily after supper. TAKE 1 CAPSULE BY MOUTH EVERYDAY AT BEDTIME Patient taking differently: Take 0.4 mg by mouth 2 (two) times daily. 09/28/21  Yes Mechele Claude, MD  cyanocobalamin 1000 MCG tablet Take 1,000 mcg by mouth daily.    [provider]  Glucose Blood (BLOOD GLUCOSE TEST STRIPS)  STRP 1 each by In Vitro route in the morning, at noon, and at bedtime. May substitute to any manufacturer covered by patient's insurance. Dx: E11.40 11/29/22   Mechele Claude, MD  Lancets [facility] DELICA PLUS [id number]) MISC TEST 3 TIME DAILY DX E11.40 03/08/23   Mechele Claude, MD  MAGNESIUM PO Take 420 mg by mouth.    [provider]    Current Facility-Administered Medications  Medication Dose Route Frequency Provider Last Rate Last Admin   0.9 %  sodium chloride infusion (Manually program via Guardrails IV Fluids)   Intravenous Once Zierle-Ghosh, Asia B, DO       acetaminophen (TYLENOL) tablet 650 mg  650 mg Oral Q6H PRN Zierle-Ghosh, Asia B, DO       Or   acetaminophen (TYLENOL) suppository 650 mg  650 mg Rectal Q6H PRN Zierle-Ghosh, Asia B, DO       atorvastatin (LIPITOR) tablet 40 mg  40 mg Oral Daily Zierle-Ghosh, Asia B, DO   40 mg at 03/18/23 0856   lisinopril (ZESTRIL) tablet 20 mg  20 mg Oral Daily Zierle-Ghosh, Asia B, DO   20 mg at 03/18/23 0855   metoprolol succinate (TOPROL-XL) 24 hr tablet 100 mg  100 mg Oral Daily Zierle-Ghosh, Asia B, DO   100 mg at 03/18/23 0856   morphine (PF) 2 MG/ML injection 2 mg  2 mg Intravenous Q2H PRN Zierle-Ghosh, Asia B, DO       ondansetron (ZOFRAN) tablet 4 mg  4 mg Oral Q6H PRN Zierle-Ghosh, Asia B, DO       Or   ondansetron (ZOFRAN) injection 4 mg  4 mg Intravenous Q6H PRN Zierle-Ghosh, Asia B, DO       oxyCODONE (Oxy IR/ROXICODONE) immediate release tablet 5 mg  5 mg Oral Q4H PRN Zierle-Ghosh, Asia B, DO        Allergies as of 03/17/2023   (No Known Allergies)    Family History  Problem Relation Age of Onset   Heart disease Father        and Multiple family members on father Side    Heart attack Father    Cancer Mother    Hypertension Mother    Hypertension Sister    Healthy Son    Irritable bowel syndrome Maternal Grandmother    Colon cancer Neg Hx     Social History   Socioeconomic History   Marital status: Married     Spouse name: Not on file   Number of children: 1   Years of education: 12 years   Highest education level: Some college, no degree  Occupational History   Occupation: Marine scientist    Comment: Retired    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 He is married and lives at home with his wife. He has one adult son. Their teenage granddaughter has recently moved in with them.    Right handed    Caffeine~ does not use    Social Determinants of Health   Financial Resource Strain: Low Risk  (02/28/2023)   Overall Financial Resource Strain (CARDIA)    Difficulty of Paying Living Expenses: Not hard at all  Food Insecurity: No Food Insecurity (03/18/2023)   Hunger Vital Sign    Worried About Running Out of Food in the Last Year: Never true    Ran Out of Food in the Last Year: Never true  Transportation Needs: No Transportation Needs (03/18/2023)   PRAPARE - Administrator, Civil Service (Medical): No    Lack of Transportation (Non-Medical): No  Physical Activity: Insufficiently Active (02/28/2023)   Exercise Vital Sign    Days of Exercise per Week: 3 days    Minutes of Exercise per Session: 30 min  Stress: No Stress Concern Present (02/28/2023)   [employer] of Occupational Health - Occupational Stress Questionnaire    Feeling of Stress : Not at all  Social Connections:  Unknown (12/08/2021)   Received from [facility]   Social Network    Social Network: Not on file  Intimate Partner Violence: Not At Risk (03/18/2023)   Humiliation, Afraid, Rape, and Kick questionnaire    Fear of Current or Ex-Partner: No    Emotionally Abused: No    Physically Abused: No    Sexually Abused: No     Review of Systems   Gen: Denies any fever, chills, loss of appetite, change in weight or weight loss CV: Denies chest pain, heart palpitations, syncope, edema  Resp: Denies shortness of breath with rest, cough, wheezing, coughing up blood, and pleurisy. GI: see HPI GU : Denies urinary burning, blood in urine, urinary frequency, and urinary incontinence. MS: Denies joint pain, limitation of movement, swelling, cramps, and atrophy.  Derm: Denies rash, itching, dry skin, hives. Psych: Denies depression, anxiety, memory loss, hallucinations, and confusion. Heme: Denies bruising or bleeding Neuro:  Denies any headaches, dizziness, paresthesias, shaking  Physical Exam   Vital Signs in last 24 hours: Temp:  [97.6 F (36.4 C)-98.9 F (37.2 C)] 98.2 F (36.8 C) (08/19 1030) Pulse Rate:  [58-82] 65 (08/19 1030) Resp:  [16-20] 18 (08/19 1030) BP: (136-165)/(54-71) 150/64 (08/19 1030) SpO2:  [95 %-99 %] 97 % (08/19 1030) FiO2 (%):  [21 %] 21 % (08/19 0218) Weight:  [107.3 kg-110.7 kg] 107.3 kg (08/19 0120) Last BM Date : 03/17/23 (Per Patient)  General:   Alert,  Well-developed, well-nourished, pleasant and cooperative in NAD Head:  Normocephalic and atraumatic. Eyes:  Sclera clear, no icterus.   Conjunctiva pink. Ears:  hard of hearing  Lungs:  Clear throughout to auscultation.  Heart:  S1 S2 without murmurs Abdomen:  Soft, large AP diameter, nontender and nondistended. Normal bowel sounds. Difficult to appreciate HSM due to body habitus.  Msk:  Symmetrical without gross deformities. Normal posture. Extremities:  Without edema. Neurologic:  Alert and  oriented  x4. Skin:  Intact without significant lesions or rashes. Psych:  Alert and cooperative. Normal mood and affect.  Intake/Output from previous day: 08/18 0701 - 08/19 0700 In: 1175 [P.O.:360; Blood:315; IV Piggyback:500] Out: -  Intake/Output this shift: Total I/O In: 544 [Blood:544] Out: -     Labs/Studies   Recent Labs Recent Labs    03/17/23 1421 03/18/23 0416  WBC 3.7* 3.0*  HGB 7.3* 7.5*  HCT 23.8* 24.7*  PLT 84* 77*   BMET Recent Labs    03/17/23 1421 03/18/23 0416  NA 129* 131*  K 5.0 4.5  CL 97* 100  CO2 26 24  GLUCOSE 147* 151*  BUN 15 15  CREATININE 1.13 0.95  CALCIUM 9.3 9.1   LFT Recent Labs    03/17/23 1421 03/18/23 0416  PROT 7.3 6.4*  ALBUMIN 4.0 3.6  AST 31 26  ALT 22 19  ALKPHOS 93 75  BILITOT 0.9 1.2  BILIDIR 0.2  --   IBILI 0.7  --    PT/INR Recent Labs    03/17/23 1547  LABPROT 17.7*  INR 1.4*    Radiology/Studies DG Chest Port 1 View  Result Date: 03/17/2023 CLINICAL DATA:  Generalized weakness EXAM: PORTABLE CHEST 1 VIEW COMPARISON:  Chest x-ray 06/18/2010 FINDINGS: Cardiac silhouette is enlarged, there central pulmonary vascular congestion. There is no focal lung infiltrate, pleural effusion or pneumothorax. No acute fractures are seen. IMPRESSION: Cardiomegaly with central pulmonary vascular congestion. Electronically Signed   By: Darliss Cheney M.D.   On: 03/17/2023 21:01     Assessment   [name] is an 82 y.o. year old male with history of CAD, DM, HTN, hepatic steatosis, OSA, reporting to the ED after PCP recommended evaluation due to Hgb 6.9 on 03/14/23 (outpatient labs). On presentation to the ED, Hgb was 7.3. We do not have prior labs to review, as last on file was from one year ago with Hgb 12.3. He has chronic thrombocytopenia but denies cirrhosis; he states he was told he was "on the verge". Notably, I do see he has seen Hem/Onc several years ago with MGUS.    Anemia: Hgb 6.9 as outpatient and 7.3 on  admission. Heme negative. No overt GI bleeding. I do note history of MGUS; he also has a pancytopenia presentation but also does have profoundly low ferritin at 6. PRBCs ordered due to symptomatic anemia. Suspect multifactorial anemia at this point but need to exclude occult GI process. He notes colonoscopy/EGD fairly recently at the [facility] and was told capsule study was indicated. Unfortunately, we do not have access to these records. In light of anticoagulation, we can proceed with EGD and capsule placement tomorrow. He has been on either Xarelto or Eliquis (difficult historian), with last dose yesterday.    Plan / Recommendations    EGD with capsule placement as appropriate tomorrow Continue to hold anticoagulation Start PPI daily Agree with Hematology consultation     03/18/2023, 11:14 AM  Gelene Mink, PhD, ANP-BC [facility] Gastroenterology

## 2023-03-18 NOTE — H&P (Addendum)
History and Physical    Patient: [name] GNF:[id number] DOB: [birth year] DOA: 03/17/2023 DOS: the patient was seen and examined on 03/18/2023 PCP: Mechele Claude, MD  Patient coming from: Home  Chief Complaint:  Chief Complaint  Patient presents with   abnormal labs   HPI: [name] is a 82 y.o. male with medical history significant of coronary artery disease, diabetes, hypertension, and more presents the ED with a chief complaint of abnormal labs.  Patient was reportedly sent by PCP for hemoglobin of 6.5.  He was seen for routine visit for hypertension, diabetes, cholesterol.  He has a history of A-fib but does and does take Xarelto for per chart review.  He reportedly denied hematochezia but said that he possibly had melena.  FOBT was negative in the ER.  [facility] but is unclear as he describes it as a "sore in his stomach."  He denied any abdominal pain to the ED provider.  At the time of my exam, patient is too somnolent to answer questions.  He arouses to sternal rub looksee in the eye, mumbles, but does not answer any questions.  It is the middle of the night.  Also at the time of my exam his CPAP seems to not be working so respiratory was called to address that.  Patient is full code by default as he has no ACP documents. Review of Systems: unable to review all systems due to the inability of the patient to answer questions. Past Medical History:  Diagnosis Date   Coronary atherosclerosis of unspecified type of vessel, native or graft    Diabetes (HCC)    Diverticulosis    Educated about COVID-19 virus infection 12/17/2018   ETD (eustachian tube dysfunction) 07/07/2013   Hepatomegaly    Hx of colonic polyps    Metabolic syndrome 11/11/2012   Obesity, unspecified    Obstructive sleep apnea    Uses CPAP.    Other and unspecified hyperlipidemia    Unspecified essential hypertension    Past Surgical History:   Procedure Laterality Date   COLONOSCOPY  9/17/20012   CORONARY STENT PLACEMENT  08/2006   LAD   POLYPECTOMY  09/17/20012   TONSILLECTOMY     Social History:  reports that he quit smoking about 52 years ago. His smoking use included cigarettes. He quit smokeless tobacco use about 33 years ago.  His smokeless tobacco use included chew. He reports current alcohol use of about 2.0 standard drinks of alcohol per week. He reports that he does not use drugs.  No Known Allergies  Family History  Problem Relation Age of Onset   Heart disease Father        and Multiple family members on father Side    Heart attack Father    Cancer Mother    Hypertension Mother    Hypertension Sister    Healthy Son    Irritable bowel syndrome Maternal Grandmother    Colon cancer Neg Hx     Prior to Admission medications   Medication Sig Start Date End Date Taking? Authorizing Provider  atorvastatin (LIPITOR) 40 MG tablet Take 1 tablet (40 mg total) by mouth daily. 09/28/21   Mechele Claude, MD  cyanocobalamin 1000 MCG tablet Take 1,000 mcg by mouth daily.    [provider]  glimepiride (AMARYL) 2 MG tablet Take 2 mg by mouth at bedtime.    [provider]  glimepiride (AMARYL) 4 MG  tablet Take 4 mg by mouth daily with breakfast.    [provider]  Glucose Blood (BLOOD GLUCOSE TEST STRIPS) STRP 1 each by In Vitro route in the morning, at noon, and at bedtime. May substitute to any manufacturer covered by patient's insurance. Dx: E11.40 11/29/22   Mechele Claude, MD  Lancets [facility] DELICA PLUS [place]) MISC TEST 3 TIME DAILY DX E11.40 03/08/23   Mechele Claude, MD  lisinopril (ZESTRIL) 40 MG tablet Take 20 mg by mouth daily.    [provider]  MAGNESIUM PO Take 420 mg by mouth.    [provider]  metformin (FORTAMET) 500 MG (OSM) 24 hr tablet Take 500 mg by mouth 2 (two) times daily with a meal.    [provider]  metoprolol succinate (TOPROL-XL) 100 MG  24 hr tablet Take 1 tablet (100 mg total) by mouth daily. Take with or immediately following a meal. 04/04/22   Mechele Claude, MD  Multiple Vitamins-Minerals (CENTRUM SILVER PO) Take 1 capsule by mouth daily.    [provider]  nitroGLYCERIN (NITROSTAT) 0.4 MG SL tablet DISSOLVE 1 TABLET UNDER TONGUE AS NEEDED FOR CHEST PAIN 12/08/20   Mechele Claude, MD  tamsulosin (FLOMAX) 0.4 MG CAPS capsule Take 2 capsules (0.8 mg total) by mouth daily after supper. TAKE 1 CAPSULE BY MOUTH EVERYDAY AT BEDTIME 09/28/21   Mechele Claude, MD    Physical Exam: Vitals:   03/17/23 2300 03/18/23 0000 03/18/23 0120 03/18/23 0218  BP: (!) 146/57 (!) 142/58 (!) 165/71   Pulse: (!) 58 60 64 82  Resp: 19 19 16 18   Temp:  98.2 F (36.8 C) 97.6 F (36.4 C)   TempSrc:  Oral Oral   SpO2: 96% 95% 98% 95%  Weight:   107.3 kg   Height:   5\' 11"  (1.803 m)    1.  General: Patient lying supine in bed,  no acute distress   2. Psychiatric: Somnolent    3. Neurologic: Somnolent, moves all 4 extremities BL   4. HEENMT:  Head is atraumatic, normocephalic, pupils reactive to light, neck is supple, trachea is midline, mucous membranes are moist   5. Respiratory : Lungs are clear to auscultation bilaterally without wheezing, rhonchi, rales, no cyanosis, no increase in work of breathing or accessory muscle use   6. Cardiovascular : Heart rate normal, rhythm is regular, no murmurs, rubs or gallops, no peripheral edema, peripheral pulses palpated   7. Gastrointestinal:  Abdomen is soft, nondistended, nontender to palpation bowel sounds active, no masses or organomegaly palpated   8. Skin:  Skin is warm, dry and intact without rashes, acute lesions, or ulcers on limited exam   9.Musculoskeletal:  No acute deformities or trauma, no asymmetry in tone, no peripheral edema, peripheral pulses palpated, no tenderness to palpation in the extremities  Data Reviewed: In the ED Patient is afebrile, heart rate is  bradycardic to normal, respiratory rate normal, blood pressure 136/60-165/70, satting 97-98% Pancytopenia with a white blood cell count of 3.7, hemoglobin 7.3, platelets 80 D-dimer elevated at 1.47 Fibrinogen 196 Iron 24, UIBC 522, TIBC 546, ferritin FOBT negative Chest x-ray shows cardiomegaly with central pulmonary vascular congestion Patient was given a 500 mL bolus  Chart review reveals that patient has had thrombocytopenia and is currently at baseline. Admission requested for symptomatic anemia  Assessment and Plan: * Symptomatic anemia -Hgb 7.3 -Dyspnea on exertion -transfuse 1 unit -Trend Hgb in the AM -Hold pharm prophylaxis -FOBT negative  Elevated d-dimer -D-Dimer 1.47 -  Non specific  -with pancytopenia -Platelets 80, Hgb 7.3 -Dyspnea is likely related to anemia -If Dyspnea does not improve with transfusion, then consider checking CTA chest   Type 2 diabetes mellitus with diabetic neuropathy, without long-term current use of insulin (HCC) -Hold glimepiride and metformin -Glucose 147 in ED -Continue to monitor  Atrial fibrillation (HCC) - Continue beta-blocker - Holding home Xarelto in the setting of anemia  Essential hypertension -Continue ACE  Hyperlipidemia -Continue statin      Advance Care Planning:   Code Status: Full Code  Consults: Heme-onc  Family Communication: No family at bedside  Severity of Illness: The appropriate patient status for this patient is OBSERVATION. Observation status is judged to be reasonable and necessary in order to provide the required intensity of service to ensure the patient's safety. The patient's presenting symptoms, physical exam findings, and initial radiographic and laboratory data in the context of their medical condition is felt to place them at decreased risk for further clinical deterioration. Furthermore, it is anticipated that the patient will be medically stable for discharge from the hospital within 2  midnights of admission.   Author: Lilyan Gilford, DO 03/18/2023 4:53 AM  For on call review [email or web address].

## 2023-03-18 NOTE — Assessment & Plan Note (Addendum)
-  Continue beta-blocker - Continue to hold any blood thinners or heparin products at the moment. -SCDs for DVT prophylaxis used while hospitalized. -Continue patient follow-up with cardiology service as an outpatient. -Patient to resume the use of Eliquis for secondary prevention once cleared by GI.

## 2023-03-18 NOTE — Assessment & Plan Note (Signed)
-  Patient complaining of dyspnea on exertion and found with low Hgb of 6.5 by PCP -FOBT negative -Patient reports no melena or bright red blood per rectum recently.  Has been on chronic anticoagulation. -2 units PRBCs has been transfused -Hemoglobin up to 9.3>>9.2 and is stable. -No overt bleeding appreciated. -Status post endoscopy without signs/source of acute bleeding and capsule endoscopy (last 1 with final results pending, GI service will follow patient up with those results as an outpatient). -Continue PPI twice a day. -Avoid the use of NSAIDs. -Follow-up visit with GI service decision regarding resumption of blood thinners will be made. -Patient hemodynamically stable and in no acute distress at time of discharge.

## 2023-03-18 NOTE — Progress Notes (Signed)
Patient placed on CPAP.  Patient stated he uses FF mask at home; patient given medium mask.  Patient also stated that his pressure was 4.5 to 5; patient placed on pressure of 5.  Patient seemed pleased with mask fit and pressure, thanked patient and left him to go to sleep.

## 2023-03-18 NOTE — Assessment & Plan Note (Signed)
-  Continue ACE -Blood pressure stable -Continue to follow vital signs.

## 2023-03-18 NOTE — Assessment & Plan Note (Signed)
-  D-Dimer 1.47 -Non specific finding. -No chest pain and no tachycardia. -After transfusion provided he was not been experiencing shortness of breath sensation. -Good saturation on room air appreciated.

## 2023-03-18 NOTE — Progress Notes (Signed)
Patient stated he uses a CPAP at home. MD Zierle-Ghosh messaged. Received order. RT called.

## 2023-03-18 NOTE — Assessment & Plan Note (Signed)
Continue statin 

## 2023-03-18 NOTE — Care Management Obs Status (Signed)
MEDICARE OBSERVATION STATUS NOTIFICATION   Patient Details  Name: [name] MRN: [id number] Date of Birth: [birth year]   Medicare Observation Status Notification Given:  Yes    Corey Harold 03/18/2023, 4:15 PM

## 2023-03-18 NOTE — Progress Notes (Signed)
Patient seen and examined; admitted after midnight secondary to symptomatic anemia/shortness of breath with exertion and a hemoglobin of 6.5.  No frank bleeding appreciated and negative fecal occult blood test as part of workup in the emergency department.  Patient reported prior history of peptic ulcer and may be some melena in the past.  Patient on chronic blood thinner secondary to atrial fibrillation (chronically on Xarelto).  Hemodynamically stable at time of examination.  Please refer to H&P written by Dr. Carren Rang for further info/details on admission.  Plan: -Transfuse additional 1 unit of PRBCs -Continue to follow hemoglobin trend -Continue holding anticoagulation at the moment -Patient will require 48 hours washout from Xarelto for any endoscopic evaluation. -Will discuss with gastroenterology service for decision regarding inpatient versus outpatient evaluation given presentation of symptomatic anemia despite negative fecal occult blood test.   Vassie Loll MD [phone]

## 2023-03-19 ENCOUNTER — Encounter (HOSPITAL_COMMUNITY)
Admission: EM | Disposition: A | Discharge status: Home / Self Care | Payer: Self-pay | Source: Home / Self Care | Attending: Internal Medicine

## 2023-03-19 ENCOUNTER — Inpatient Hospital Stay (HOSPITAL_COMMUNITY): Payer: Medicare HMO | Admitting: Anesthesiology

## 2023-03-19 ENCOUNTER — Encounter (HOSPITAL_COMMUNITY): Payer: Self-pay | Admitting: Family Medicine

## 2023-03-19 DIAGNOSIS — I2510 Atherosclerotic heart disease of native coronary artery without angina pectoris: Secondary | ICD-10-CM | POA: Diagnosis not present

## 2023-03-19 DIAGNOSIS — I10 Essential (primary) hypertension: Secondary | ICD-10-CM | POA: Diagnosis not present

## 2023-03-19 DIAGNOSIS — Z87891 Personal history of nicotine dependence: Secondary | ICD-10-CM | POA: Diagnosis not present

## 2023-03-19 DIAGNOSIS — I4891 Unspecified atrial fibrillation: Secondary | ICD-10-CM | POA: Diagnosis not present

## 2023-03-19 DIAGNOSIS — D649 Anemia, unspecified: Secondary | ICD-10-CM

## 2023-03-19 DIAGNOSIS — R0609 Other forms of dyspnea: Secondary | ICD-10-CM

## 2023-03-19 DIAGNOSIS — R7989 Other specified abnormal findings of blood chemistry: Secondary | ICD-10-CM | POA: Diagnosis not present

## 2023-03-19 DIAGNOSIS — K921 Melena: Secondary | ICD-10-CM

## 2023-03-19 HISTORY — PX: GIVENS CAPSULE STUDY: SHX5432

## 2023-03-19 HISTORY — PX: ESOPHAGOGASTRODUODENOSCOPY (EGD) WITH PROPOFOL: SHX5813

## 2023-03-19 LAB — BASIC METABOLIC PANEL
Anion gap: 9 (ref 5–15)
BUN: 14 mg/dL (ref 8–23)
CO2: 23 mmol/L (ref 22–32)
Calcium: 9 mg/dL (ref 8.9–10.3)
Chloride: 97 mmol/L — ABNORMAL LOW (ref 98–111)
Creatinine, Ser: 0.97 mg/dL (ref 0.61–1.24)
GFR, Estimated: >60 mL/min (ref >60)
Glucose, Bld: 156 mg/dL — ABNORMAL HIGH (ref 70–99)
Potassium: 3.8 mmol/L (ref 3.5–5.1)
Sodium: 129 mmol/L — ABNORMAL LOW (ref 135–145)

## 2023-03-19 LAB — TYPE AND SCREEN
ABO/RH(D): O POS
Antibody Screen: NEGATIVE
Unit division: 0
Unit division: 0
Unit division: 0

## 2023-03-19 LAB — BPAM RBC
Blood Product Expiration Date: 202409182359
Blood Product Expiration Date: 202409202359
Blood Product Expiration Date: 202409202359
ISSUE DATE / TIME: 202408182001
ISSUE DATE / TIME: 202408190545
ISSUE DATE / TIME: 202408191053
Unit Type and Rh: 5100
Unit Type and Rh: 5100
Unit Type and Rh: 5100

## 2023-03-19 LAB — CBC
HCT: 29.2 % — ABNORMAL LOW (ref 39.0–52.0)
Hemoglobin: 9.2 g/dL — ABNORMAL LOW (ref 13.0–17.0)
MCH: 26.4 pg (ref 26.0–34.0)
MCHC: 31.5 g/dL (ref 30.0–36.0)
MCV: 83.7 fL (ref 80.0–100.0)
Platelets: 77 10*3/uL — ABNORMAL LOW (ref 150–400)
RBC: 3.49 MIL/uL — ABNORMAL LOW (ref 4.22–5.81)
RDW: 16 % — ABNORMAL HIGH (ref 11.5–15.5)
WBC: 4.2 10*3/uL (ref 4.0–10.5)
nRBC: 0 % (ref 0.0–0.2)

## 2023-03-19 LAB — GLUCOSE, CAPILLARY
Glucose-Capillary: 145 mg/dL — ABNORMAL HIGH (ref 70–99)
Glucose-Capillary: 127 mg/dL — ABNORMAL HIGH (ref 70–99)

## 2023-03-19 LAB — HAPTOGLOBIN: Haptoglobin: 113 mg/dL (ref 38–329)

## 2023-03-19 SURGERY — ESOPHAGOGASTRODUODENOSCOPY (EGD) WITH PROPOFOL
Anesthesia: General

## 2023-03-19 MED ORDER — SODIUM CHLORIDE 0.9 % IV SOLN: INTRAVENOUS | Status: DC

## 2023-03-19 MED ORDER — LIDOCAINE HCL 1 % IJ SOLN
INTRAMUSCULAR | Status: DC | PRN
  Administered 2023-03-19: 50 mg via INTRADERMAL

## 2023-03-19 MED ORDER — LACTATED RINGERS IV SOLN: INTRAVENOUS | Status: DC | PRN

## 2023-03-19 MED ORDER — PROPOFOL 10 MG/ML IV BOLUS
INTRAVENOUS | Status: DC | PRN
Start: 2023-03-19 — End: 2023-03-19
  Administered 2023-03-19 (×4): 50 mg via INTRAVENOUS

## 2023-03-19 NOTE — Transfer of Care (Deleted)
Immediate Anesthesia Transfer of Care Note  Patient: [name]  Procedure(s) Performed: ESOPHAGOGASTRODUODENOSCOPY (EGD) WITH PROPOFOL GIVENS CAPSULE STUDY  Patient Location: Short Stay  Anesthesia Type:General  Level of Consciousness: awake  Airway & Oxygen Therapy: Patient Spontanous Breathing  Post-op Assessment: Report given to RN  Post vital signs: Reviewed and stable  Last Vitals:  Vitals Value Taken Time  BP 170/71 03/19/23 1417  Temp 36.8 C 03/19/23 1417  Pulse 54 03/19/23 1417  Resp 16 03/19/23 1417  SpO2 97 % 03/19/23 1417    Last Pain:  Vitals:   03/19/23 1417  TempSrc: Oral  PainSc: 0-No pain         Complications: No notable events documented.

## 2023-03-19 NOTE — Progress Notes (Signed)
Progress Note   Patient: [name] GNF:[id number] DOB: Oct 28, 1940 DOA: 03/17/2023     1 DOS: the patient was seen and examined on 03/19/2023   Brief hospital admission narrative: As per H&P written by Dr. Carren Rang on 03/18/23 [name] is a 82 y.o. male with medical history significant of coronary artery disease, diabetes, hypertension, and more presents the ED with a chief complaint of abnormal labs.  Patient was reportedly sent by PCP for hemoglobin of 6.5.  He was seen for routine visit for hypertension, diabetes, cholesterol.  He has a history of A-fib but does and does take Xarelto for per chart review.  He reportedly denied hematochezia but said that he possibly had melena.  FOBT was negative in the ER.  [facility] but is unclear as he describes it as a "sore in his stomach."  He denied any abdominal pain to the ED provider.    Assessment and Plan: * Symptomatic anemia -Patient complaining of dyspnea on exertion and found with low Hgb of 6.5 by PCP -FOBT negative -Patient reports no melena or bright red blood per rectum recently.  Has been on chronic anticoagulation. -2 units PRBCs has been transfused -Hemoglobin up to 9.3 -Planning for EGD and capsule endoscopy later today. -GI service recommendation -Continue n.p.o. status at the moment.  Elevated d-dimer -D-Dimer 1.47 -Non specific  -Patient with pancytopenia -No chest pain and no tachycardia. -After transfusion provided he was not been experiencing shortness of breath sensation.  Type 2 diabetes mellitus with diabetic neuropathy, without long-term current use of insulin (HCC) -Continue to hold glimepiride and metformin -Follow CBG fluctuation and adjust hypoglycemic regimen as needed Continue sliding scale insulin.  Atrial fibrillation (HCC) -Continue beta-blocker - Continue to hold any blood thinners or heparin products at the moment. -SCDs for DVT  prophylaxis has been ordered. -Continue patient follow-up with cardiology service -Patient expressed that his anticoagulation therapy has been exchanged from Xarelto to Eliquis. -Follow GI service recommendations for clearance regarding when to resume blood thinners.  Essential hypertension -Continue ACE -Blood pressure stable -Continue to follow vital signs.  Hyperlipidemia -Continue statin -Heart healthy diet discussed with patient.   Subjective:  Afebrile, no chest pain, no nausea or vomiting.  No overt bleeding reported overnight.  Hemodynamically stable.  Physical Exam: Vitals:   03/18/23 2119 03/18/23 2129 03/19/23 0412 03/19/23 1417  BP: (!) 155/62  (!) 153/62 (!) 170/71  Pulse: (!) 58 (!) 58 62 (!) 54  Resp: 18 18 18 16   Temp: 98.3 F (36.8 C)  98.1 F (36.7 C) 98.3 F (36.8 C)  TempSrc:    Oral  SpO2: 95% 94% 97% 97%  Weight:    107.3 kg  Height:    5\' 11"  (1.803 m)   General exam: Alert, awake, oriented x 3; in no acute distress. Respiratory system: Clear to auscultation. Respiratory effort normal.  Good saturation on room air. Cardiovascular system:RRR. No rubs or gallops. Gastrointestinal system: Abdomen is nondistended, soft and nontender.  Positive bowel sounds. Central nervous system: No focal neurological deficits. Extremities: No cyanosis or clubbing. Skin: No petechiae. Psychiatry: Judgement and insight appear normal. Mood & affect appropriate.   Data Reviewed: CBC: WBCs 4.2, hemoglobin 9.2 and platelet count 77K BMET: Sodium 129, potassium 3.8, chloride 97, bicarb 23, glucose 156, BUN 14, creatinine 0.97, GFR >60  Family Communication: Wife updated at bedside.  Disposition: Status is: Inpatient Remains inpatient appropriate because: Continue GI  workup to rule out component of blood loss anemia.  Planning for endoscopy/capsule endoscopy later today.   Planned Discharge Destination: Home    Time spent: 35 minutes  Author: Vassie Loll,  MD 03/19/2023 3:59 PM  For on call review [email or web address].

## 2023-03-19 NOTE — Anesthesia Preprocedure Evaluation (Signed)
Anesthesia Evaluation  Patient identified by MRN, date of birth, ID band Patient awake    Reviewed: Allergy & Precautions, H&P , NPO status , Patient's Chart, lab work & pertinent test results, reviewed documented beta blocker date and time   Airway Mallampati: II  TM Distance: >3 FB Neck ROM: full    Dental no notable dental hx. (+) Missing   Pulmonary neg pulmonary ROS, sleep apnea , former smoker   Pulmonary exam normal breath sounds clear to auscultation       Cardiovascular Exercise Tolerance: Good hypertension, + CAD and + Cardiac Stents  + Valvular Problems/Murmurs  Rhythm:regular Rate:Normal     Neuro/Psych  Neuromuscular disease negative neurological ROS  negative psych ROS   GI/Hepatic negative GI ROS, Neg liver ROS,,,  Endo/Other  negative endocrine ROSdiabetes    Renal/GU negative Renal ROS  negative genitourinary   Musculoskeletal   Abdominal   Peds  Hematology negative hematology ROS (+) Blood dyscrasia, anemia   Anesthesia Other Findings   Reproductive/Obstetrics negative OB ROS                             Anesthesia Physical Anesthesia Plan  ASA: 4 and emergent  Anesthesia Plan: General   Post-op Pain Management:    Induction:   PONV Risk Score and Plan: Propofol infusion  Airway Management Planned:   Additional Equipment:   Intra-op Plan:   Post-operative Plan:   Informed Consent: I have reviewed the patients History and Physical, chart, labs and discussed the procedure including the risks, benefits and alternatives for the proposed anesthesia with the patient or authorized representative who has indicated his/her understanding and acceptance.     Dental Advisory Given  Plan Discussed with: CRNA  Anesthesia Plan Comments:        Anesthesia Quick Evaluation

## 2023-03-19 NOTE — Anesthesia Postprocedure Evaluation (Signed)
Anesthesia Post Note  Patient: Deeann Saint Grafton  Procedure(s) Performed: ESOPHAGOGASTRODUODENOSCOPY (EGD) WITH PROPOFOL GIVENS CAPSULE STUDY  Patient location during evaluation: Short Stay Anesthesia Type: General Level of consciousness: awake and alert Pain management: pain level controlled Vital Signs Assessment: post-procedure vital signs reviewed and stable Respiratory status: spontaneous breathing Cardiovascular status: blood pressure returned to baseline and stable Postop Assessment: no apparent nausea or vomiting Anesthetic complications: no   No notable events documented.   Last Vitals:  Vitals:   03/19/23 0412 03/19/23 1417  BP: (!) 153/62 (!) 170/71  Pulse: 62 (!) 54  Resp: 18 16  Temp: 36.7 C 36.8 C  SpO2: 97% 97%    Last Pain:  Vitals:   03/19/23 1530  TempSrc:   PainSc: 0-No pain                 Damonie Ellenwood

## 2023-03-19 NOTE — Brief Op Note (Signed)
03/19/2023  3:48 PM  PATIENT:  [name]  82 y.o. male  PRE-OPERATIVE DIAGNOSIS:  acute anemia  POST-OPERATIVE DIAGNOSIS:  givens capsule placed @ 1540.  PROCEDURE:  Procedure(s): ESOPHAGOGASTRODUODENOSCOPY (EGD) WITH PROPOFOL (N/A) GIVENS CAPSULE STUDY (N/A)  SURGEON:  Surgeons and Role:    * Dolores Frame, MD - Primary  Patient underwent EGD under propofol sedation.  Tolerated the procedure adequately.   FINDINGS: - Normal esophagus.  - Normal stomach.  - Normal examined duodenum. Capsule deployed in duodenum.  RECOMMENDATIONS - Return patient to hospital ward for ongoing care.  - Resume previous diet. Follow capsule endoscopy protocol to resume diet. - Daily H/H.  [name], MD Gastroenterology and Hepatology [facility]

## 2023-03-19 NOTE — Progress Notes (Signed)
We will proceed with EGD and endoscopic capsule deployment as scheduled.  I thoroughly discussed with the patient the procedure, including the risks involved. Patient understands what the procedure involves including the benefits and any risks. Patient understands alternatives to the proposed procedure. Risks including (but not limited to) bleeding, tearing of the lining (perforation), rupture of adjacent organs, problems with heart and lung function, infection, and medication reactions. A small percentage of complications may require surgery, hospitalization, repeat endoscopic procedure, and/or transfusion.  Patient understood and agreed.  [name], MD Gastroenterology and Hepatology [facility]

## 2023-03-19 NOTE — Progress Notes (Signed)
Mobility Specialist Progress Note:   03/19/23 0945  Mobility  Activity Ambulated independently in hallway  Level of Assistance Contact guard assist, steadying assist  Assistive Device Cane  Distance Ambulated (ft) 160 ft  Range of Motion/Exercises Active;All extremities  Activity Response Tolerated well  Mobility Referral Yes  $Mobility charge 1 Mobility  Mobility Specialist Start Time (ACUTE ONLY) 0945  Mobility Specialist Stop Time (ACUTE ONLY) 1000  Mobility Specialist Time Calculation (min) (ACUTE ONLY) 15 min   Pt received in bed, eager for mobility. Required CGA for safety while ambulating in hallway with cane. Tolerated well, denies dizziness or SOB. Returned pt to room, all needs met, call bell in reach.   Feliciana Rossetti Mobility Specialist Please contact via Special educational needs teacher or  Rehab office at [phone]

## 2023-03-19 NOTE — Op Note (Signed)
[facility] Patient Name: [name] Procedure Date: 03/19/2023 3:16 PM MRN: [id number] Date of Birth: March 17, 1941 Attending MD: [name] , , [id number] CSN: [id number] Age: 82 Admit Type: Inpatient Procedure:                Upper GI endoscopy Indications:              Iron deficiency anemia Providers:                [name], Crystal Page, Pandora Leiter,                            Technician Referring MD:              Medicines:                Monitored Anesthesia Care Complications:            No immediate complications. Estimated Blood Loss:     Estimated blood loss: none. Procedure:                Pre-Anesthesia Assessment:                           - Prior to the procedure, a History and Physical                            was performed, and patient medications, allergies                            and sensitivities were reviewed. The patient's                            tolerance of previous anesthesia was reviewed.                           - The risks and benefits of the procedure and the                            sedation options and risks were discussed with the                            patient. All questions were answered and informed                            consent was obtained.                           - ASA Grade Assessment: III - A patient with severe                            systemic disease.                           After obtaining informed consent, the endoscope was                            passed under direct vision. Throughout the  procedure, the patient's blood pressure, pulse, and                            oxygen saturations were monitored continuously. The                            [id number]-H190 ([id number]) scope was introduced through the                            mouth, and advanced to the second part of duodenum.                            The upper GI endoscopy was accomplished without                             difficulty. The patient tolerated the procedure                            well. Scope In: 3:36:47 PM Scope Out: 3:41:08 PM Total Procedure Duration: 0 hours 4 minutes 21 seconds  Findings:      The examined esophagus was normal.      The entire examined stomach was normal.      The examined duodenum was normal. Endoscopic capsule was successfully       deployed in the duodenal bulb. Impression:               - Normal esophagus.                           - Normal stomach.                           - Normal examined duodenum. Capsule deployed in                            duodenum.                           - No specimens collected. Moderate Sedation:      Per Anesthesia Care Recommendation:           - Return patient to hospital ward for ongoing care.                           - Resume previous diet. Follow capsule endoscopy                            protocol to resume diet.                           - Daily H/H. Procedure Code(s):        --- Professional ---                           [phone], Esophagogastroduodenoscopy, flexible,  transoral; diagnostic, including collection of                            specimen(s) by brushing or washing, when performed                            (separate procedure) Diagnosis Code(s):        --- Professional ---                           D50.9, Iron deficiency anemia, unspecified CPT copyright 2022 American Medical Association. All rights reserved. The codes documented in this report are preliminary and upon coder review may  be revised to meet current compliance requirements. [name], MD [name],  03/19/2023 3:50:27 PM This report has been signed electronically. Number of Addenda: 0

## 2023-03-19 NOTE — Transfer of Care (Signed)
Immediate Anesthesia Transfer of Care Note  Patient: [name]  Procedure(s) Performed: ESOPHAGOGASTRODUODENOSCOPY (EGD) WITH PROPOFOL GIVENS CAPSULE STUDY  Patient Location: PACU  Anesthesia Type:General  Level of Consciousness: awake  Airway & Oxygen Therapy: Patient Spontanous Breathing  Post-op Assessment: Report given to RN  Post vital signs: Reviewed and stable  Last Vitals:  Vitals Value Taken Time  BP    Temp    Pulse    Resp    SpO2      Last Pain:  Vitals:   03/19/23 1530  TempSrc:   PainSc: 0-No pain         Complications: No notable events documented.

## 2023-03-19 NOTE — Plan of Care (Signed)
  Problem: Education: Goal: Knowledge of General Education information will improve Description: Including pain rating scale, medication(s)/side effects and non-pharmacologic comfort measures 03/19/2023 2203 by Peggye Form, RN Outcome: Progressing    Problem: Health Behavior/Discharge Planning: Goal: Ability to manage health-related needs will improve 03/19/2023 2203 by Peggye Form, RN Outcome: Progressing    Problem: Clinical Measurements: Goal: Ability to maintain clinical measurements within normal limits will improve 03/19/2023 2203 by Peggye Form, RN Outcome: Progressing Goal: Will remain free from infection 03/19/2023 2203 by Peggye Form, RN Outcome: Progressing

## 2023-03-20 ENCOUNTER — Encounter (HOSPITAL_COMMUNITY): Payer: Self-pay | Admitting: Gastroenterology

## 2023-03-20 DIAGNOSIS — I10 Essential (primary) hypertension: Secondary | ICD-10-CM | POA: Diagnosis not present

## 2023-03-20 DIAGNOSIS — D649 Anemia, unspecified: Secondary | ICD-10-CM | POA: Diagnosis not present

## 2023-03-20 DIAGNOSIS — I4891 Unspecified atrial fibrillation: Secondary | ICD-10-CM | POA: Diagnosis not present

## 2023-03-20 DIAGNOSIS — Z6830 Body mass index (BMI) 30.0-30.9, adult: Secondary | ICD-10-CM

## 2023-03-20 DIAGNOSIS — E1140 Type 2 diabetes mellitus with diabetic neuropathy, unspecified: Secondary | ICD-10-CM | POA: Diagnosis not present

## 2023-03-20 DIAGNOSIS — E6609 Other obesity due to excess calories: Secondary | ICD-10-CM

## 2023-03-20 LAB — CBC
HCT: 29.1 % — ABNORMAL LOW (ref 39.0–52.0)
Hemoglobin: 9.2 g/dL — ABNORMAL LOW (ref 13.0–17.0)
MCH: 26.7 pg (ref 26.0–34.0)
MCHC: 31.6 g/dL (ref 30.0–36.0)
MCV: 84.3 fL (ref 80.0–100.0)
Platelets: 78 10*3/uL — ABNORMAL LOW (ref 150–400)
RBC: 3.45 MIL/uL — ABNORMAL LOW (ref 4.22–5.81)
RDW: 16.1 % — ABNORMAL HIGH (ref 11.5–15.5)
WBC: 3.7 10*3/uL — ABNORMAL LOW (ref 4.0–10.5)
nRBC: 0 % (ref 0.0–0.2)

## 2023-03-20 MED ORDER — PANTOPRAZOLE SODIUM 40 MG PO TBEC
40.0000 mg | DELAYED_RELEASE_TABLET | Freq: Two times a day (BID) | ORAL | 1 refills | Status: AC
Start: 2023-03-20 — End: 2024-05-27

## 2023-03-20 MED ORDER — LISINOPRIL 40 MG PO TABS: 20.0000 mg | ORAL_TABLET | Freq: Every day | ORAL | Status: AC

## 2023-03-20 MED ORDER — ACETAMINOPHEN 500 MG PO TABS: 1000.0000 mg | ORAL_TABLET | Freq: Three times a day (TID) | ORAL | Status: AC | PRN

## 2023-03-20 NOTE — Progress Notes (Signed)
Givens capsule placed yesterday, to be read today, unfortunately Endoscopy experienced technical difficulties with their pill cam computer, the study was not found in the system upon my attempt to read, they are currently unable to get into the pill cam computer and do not think that study downloaded prior to computer going down.   Read of study will be pending until End[facility] pill cam computer is back up so that capsule study can be downloaded and made available for review by our team.   Merrit Friesen L. Jeanmarie Hubert, MSN, APRN, AGNP-C Adult-Gerontology Nurse Practitioner [facility] Gastroenterology at [facility]

## 2023-03-20 NOTE — Discharge Summary (Signed)
Physician Discharge Summary   Patient: [name] MRN: [id number] DOB: 26-May-1941  Admit date:     03/17/2023  Discharge date: 03/20/23  Discharge Physician: Vassie Loll   PCP: Mechele Claude, MD   Recommendations at discharge:  Repeat CBC to follow hemoglobin trend/stability Repeat basic metabolic panel to follow electrolytes and renal function Reassess blood pressure and adjust antihypertensive treatment as needed Continue to closely follow CBGs fluctuation with further adjustment to hypoglycemic regimen as required. Patient to resume the use of Eliquis once cleared by GI service.  Discharge Diagnoses: Principal Problem:   Symptomatic anemia Active Problems:   Hyperlipidemia   Essential hypertension   Class II obesity   Atrial fibrillation (HCC)   Type 2 diabetes mellitus with diabetic neuropathy, without long-term current use of insulin (HCC)   Elevated d-dimer   Melena  Brief hospital admission narrative: As per H&P written by Dr. Carren Rang on 03/18/23 [name] is a 82 y.o. male with medical history significant of coronary artery disease, diabetes, hypertension, and more presents the ED with a chief complaint of abnormal labs.  Patient was reportedly sent by PCP for hemoglobin of 6.5.  He was seen for routine visit for hypertension, diabetes, cholesterol.  He has a history of A-fib but does and does take Xarelto for per chart review.  He reportedly denied hematochezia but said that he possibly had melena.  FOBT was negative in the ER.  [facility] but is unclear as he describes it as a "sore in his stomach."  He denied any abdominal pain to the ED provider.    Assessment and Plan: * Symptomatic anemia -Patient complaining of dyspnea on exertion and found with low Hgb of 6.5 by PCP -FOBT negative -Patient reports no melena or bright red blood per rectum recently.  Has been on chronic anticoagulation. -2 units PRBCs  has been transfused -Hemoglobin up to 9.3>>9.2 and is stable. -No overt bleeding appreciated. -Status post endoscopy without signs/source of acute bleeding and capsule endoscopy (last 1 with final results pending, GI service will follow patient up with those results as an outpatient). -Continue PPI twice a day. -Avoid the use of NSAIDs. -Follow-up visit with GI service decision regarding resumption of blood thinners will be made. -Patient hemodynamically stable and in no acute distress at time of discharge.  Elevated d-dimer -D-Dimer 1.47 -Non specific finding. -No chest pain and no tachycardia. -After transfusion provided he was not been experiencing shortness of breath sensation. -Good saturation on room air appreciated.  Type 2 diabetes mellitus with diabetic neuropathy, without long-term current use of insulin (HCC) -Resume home hypoglycemic regimen -Continue to follow CBG fluctuation/A1c with further adjustment to blood sugar therapy as required.  Atrial fibrillation (HCC) -Continue beta-blocker - Continue to hold any blood thinners or heparin products at the moment. -SCDs for DVT prophylaxis used while hospitalized. -Continue patient follow-up with cardiology service as an outpatient. -Patient to resume the use of Eliquis for secondary prevention once cleared by GI.  Essential hypertension -Continue ACE and metoprolol -Blood pressure stable -Heart healthy diet recommended.  Obesity, Class I, BMI 30-34.9 -Body mass index is 32.99 kg/m. -Low-calorie diet, portion control and increase physical activity discussed with patient.  Hyperlipidemia -Continue statin -Heart healthy diet discussed with patient.   Consultants: GI service. Procedures performed: See below for x-ray reports; EGD and capsule endoscopy performed during this hospitalization. Disposition: Home Diet recommendation: Heart healthy, modified carbohydrate and low calorie  diet recommended.  DISCHARGE  MEDICATION: Allergies as of 03/20/2023   No Known Allergies      Medication List     STOP taking these medications    PRESCRIPTION MEDICATION       TAKE these medications    acetaminophen 500 MG tablet Commonly known as: TYLENOL Take 2 tablets (1,000 mg total) by mouth every 8 (eight) hours as needed for mild pain, fever or headache.   atorvastatin 40 MG tablet Commonly known as: LIPITOR Take 1 tablet (40 mg total) by mouth daily.   BLOOD GLUCOSE TEST STRIPS Strp 1 each by In Vitro route in the morning, at noon, and at bedtime. May substitute to any manufacturer covered by patient's insurance. Dx: E11.40   CENTRUM SILVER PO Take 1 capsule by mouth daily.   cyanocobalamin 1000 MCG tablet Take 1,000 mcg by mouth daily.   glimepiride 4 MG tablet Commonly known as: AMARYL Take 4 mg by mouth daily with breakfast. What changed: Another medication with the same name was removed. Continue taking this medication, and follow the directions you see here.   lisinopril 40 MG tablet Commonly known as: ZESTRIL Take 0.5 tablets (20 mg total) by mouth daily. What changed: when to take this   MAGNESIUM PO Take 420 mg by mouth.   metformin 500 MG (OSM) 24 hr tablet Commonly known as: FORTAMET Take 500 mg by mouth 2 (two) times daily with a meal.   metoprolol succinate 100 MG 24 hr tablet Commonly known as: TOPROL-XL Take 1 tablet (100 mg total) by mouth daily. Take with or immediately following a meal.   nitroGLYCERIN 0.4 MG SL tablet Commonly known as: NITROSTAT DISSOLVE 1 TABLET UNDER TONGUE AS NEEDED FOR CHEST PAIN   OneTouch Delica Plus Lancet33G Misc TEST 3 TIME DAILY DX E11.40   pantoprazole 40 MG tablet Commonly known as: Protonix Take 1 tablet (40 mg total) by mouth 2 (two) times daily.   tamsulosin 0.4 MG Caps capsule Commonly known as: FLOMAX Take 2 capsules (0.8 mg total) by mouth daily after supper. TAKE 1 CAPSULE BY MOUTH EVERYDAY AT BEDTIME What  changed:  how much to take when to take this additional instructions        Follow-up Information     Mechele Claude, MD. Schedule an appointment as soon as possible for a visit in 2 week(s).   Specialty: Family Medicine Contact information: [address] [place] [address] [phone]                Discharge Exam: Ceasar Mons Weights   03/17/23 1411 03/18/23 0120 03/19/23 1417  Weight: 110.7 kg 107.3 kg 107.3 kg   General exam: Alert, awake, oriented x 3; in no acute distress.  No overt bleeding reported. Respiratory system: Clear to auscultation. Respiratory effort normal.  Good saturation on room air. Cardiovascular system:RRR. No rubs or gallops. Gastrointestinal system: Abdomen is nondistended, soft and nontender.  Positive bowel sounds. Central nervous system: No focal neurological deficits. Extremities: No cyanosis or clubbing. Skin: No petechiae. Psychiatry: Judgement and insight appear normal. Mood & affect appropriate.   Condition at discharge: Stable and improved.  The results of significant diagnostics from this hospitalization (including imaging, microbiology, ancillary and laboratory) are listed below for reference.   Imaging Studies: DG Chest Port 1 View  Result Date: 03/17/2023 CLINICAL DATA:  Generalized weakness EXAM: PORTABLE CHEST 1 VIEW COMPARISON:  Chest x-ray 06/18/2010 FINDINGS: Cardiac silhouette is enlarged, there central pulmonary vascular congestion. There is no focal  lung infiltrate, pleural effusion or pneumothorax. No acute fractures are seen. IMPRESSION: Cardiomegaly with central pulmonary vascular congestion. Electronically Signed   By: Darliss Cheney M.D.   On: 03/17/2023 21:01    Microbiology: Results for orders placed or performed in visit on 07/03/19  Novel Coronavirus, NAA ([facility])     Status: None   Collection Time: 07/03/19 12:00 AM   Specimen: Nasopharyngeal(NP) swabs in vial transport medium   NASOPHARYNGE  TESTING  Result  Value Ref Range Status   SARS-CoV-2, NAA Not Detected Not Detected Final    Comment: This nucleic acid amplification test was developed and its performance characteristics determined by [employer]. Nucleic acid amplification tests include PCR and TMA. This test has not been FDA cleared or approved. This test has been authorized by FDA under an Emergency Use Authorization (EUA). This test is only authorized for the duration of time the declaration that circumstances exist justifying the authorization of the emergency use of in vitro diagnostic tests for detection of SARS-CoV-2 virus and/or diagnosis of COVID-19 infection under section 564(b)(1) of the Act, 21 U.S.C. 161WRU-0(A) (1), unless the authorization is terminated or revoked sooner. When diagnostic testing is negative, the possibility of a false negative result should be considered in the context of a patient's recent exposures and the presence of clinical signs and symptoms consistent with COVID-19. An individual without symptoms of COVID-19 and who is not shedding SARS-CoV-2 virus would  expect to have a negative (not detected) result in this assay.     Labs: CBC: Recent Labs  Lab 03/14/23 1352 03/17/23 1421 03/18/23 0416 03/19/23 0420 03/20/23 0411  WBC 3.2* 3.7* 3.0* 4.2 3.7*  NEUTROABS 2.2 2.6 2.0  --   --   HGB 6.9* 7.3* 7.5* 9.2* 9.2*  HCT 23.1* 23.8* 24.7* 29.2* 29.1*  MCV 85 86.5 86.7 83.7 84.3  PLT 80* 84* 77* 77* 78*   Basic Metabolic Panel: Recent Labs  Lab 03/14/23 1352 03/17/23 1421 03/18/23 0416 03/19/23 0420  NA 133* 129* 131* 129*  K 4.9 5.0 4.5 3.8  CL 96 97* 100 97*  CO2 19* 26 24 23   GLUCOSE 162* 147* 151* 156*  BUN 15 15 15 14   CREATININE 1.06 1.13 0.95 0.97  CALCIUM 9.0 9.3 9.1 9.0  MG  --   --  1.8  --    Liver Function Tests: Recent Labs  Lab 03/14/23 1352 03/17/23 1421 03/18/23 0416  AST 30 31 26   ALT 16 22 19   ALKPHOS 104 93 75  BILITOT 0.6 0.9 1.2  PROT 6.5  7.3 6.4*  ALBUMIN 4.1 4.0 3.6   CBG: Recent Labs  Lab 03/19/23 1159 03/19/23 1240  GLUCAP 145* 127*    Discharge time spent: greater than 30 minutes.  Signed: Vassie Loll, MD [facility] 03/20/2023

## 2023-03-20 NOTE — Progress Notes (Signed)
[name]  Admit Date:  03/17/2023 Discharge date: 03/20/2023   [name] to be D/C'd Home per MD order.  AVS completed. Patient/caregiver able to verbalize understanding.  Discharge Medication: Allergies as of 03/20/2023   No Known Allergies      Medication List     STOP taking these medications    PRESCRIPTION MEDICATION       TAKE these medications    acetaminophen 500 MG tablet Commonly known as: TYLENOL Take 2 tablets (1,000 mg total) by mouth every 8 (eight) hours as needed for mild pain, fever or headache.   atorvastatin 40 MG tablet Commonly known as: LIPITOR Take 1 tablet (40 mg total) by mouth daily.   BLOOD GLUCOSE TEST STRIPS Strp 1 each by In Vitro route in the morning, at noon, and at bedtime. May substitute to any manufacturer covered by patient's insurance. Dx: E11.40   CENTRUM SILVER PO Take 1 capsule by mouth daily.   cyanocobalamin 1000 MCG tablet Take 1,000 mcg by mouth daily.   glimepiride 4 MG tablet Commonly known as: AMARYL Take 4 mg by mouth daily with breakfast. What changed: Another medication with the same name was removed. Continue taking this medication, and follow the directions you see here.   lisinopril 40 MG tablet Commonly known as: ZESTRIL Take 0.5 tablets (20 mg total) by mouth daily. What changed: when to take this   MAGNESIUM PO Take 420 mg by mouth.   metformin 500 MG (OSM) 24 hr tablet Commonly known as: FORTAMET Take 500 mg by mouth 2 (two) times daily with a meal.   metoprolol succinate 100 MG 24 hr tablet Commonly known as: TOPROL-XL Take 1 tablet (100 mg total) by mouth daily. Take with or immediately following a meal.   nitroGLYCERIN 0.4 MG SL tablet Commonly known as: NITROSTAT DISSOLVE 1 TABLET UNDER TONGUE AS NEEDED FOR CHEST PAIN   OneTouch Delica Plus Lancet33G Misc TEST 3 TIME DAILY DX E11.40   pantoprazole 40 MG tablet Commonly known as: Protonix Take 1 tablet (40 mg total) by mouth  2 (two) times daily.   tamsulosin 0.4 MG Caps capsule Commonly known as: FLOMAX Take 2 capsules (0.8 mg total) by mouth daily after supper. TAKE 1 CAPSULE BY MOUTH EVERYDAY AT BEDTIME What changed:  how much to take when to take this additional instructions        Discharge Assessment: Vitals:   03/19/23 2318 03/20/23 0414  BP:  (!) 151/58  Pulse: (!) 58 (!) 53  Resp: 18 18  Temp:  (!) 97.5 F (36.4 C)  SpO2: 97% 93%   Skin clean, dry and intact without evidence of skin break down, no evidence of skin tears noted. IV catheter discontinued intact. Site without signs and symptoms of complications - no redness or edema noted at insertion site, patient denies c/o pain - only slight tenderness at site.  Dressing with slight pressure applied.  D/c Instructions-Education: Discharge instructions given to patient/family with verbalized understanding. D/c education completed with patient/family including follow up instructions, medication list, d/c activities limitations if indicated, with other d/c instructions as indicated by MD - patient able to verbalize understanding, all questions fully answered. Patient instructed to return to ED, call 911, or call MD for any changes in condition.  Patient escorted via WC, and D/C home via private auto.  Cristal Ford, LPN 1/61/0960 4:54 PM

## 2023-03-20 NOTE — Progress Notes (Signed)
Mobility Specialist Progress Note:    03/20/23 0930  Mobility  Activity Ambulated with assistance in hallway  Level of Assistance Contact guard assist, steadying assist  Assistive Device Cane  Distance Ambulated (ft) 200 ft  Range of Motion/Exercises Active;All extremities  Activity Response Tolerated well  Mobility Referral Yes  $Mobility charge 1 Mobility  Mobility Specialist Start Time (ACUTE ONLY) 0930  Mobility Specialist Stop Time (ACUTE ONLY) 0945  Mobility Specialist Time Calculation (min) (ACUTE ONLY) 15 min   Pt received in bed, agreeable to mobility session. Ambulated in hallway with Cane and CGA. Tolerated well, asx throughout. Returned pt to room, eager for d/c. All needs met, call bell in reach.   Feliciana Rossetti Mobility Specialist Please contact via Special educational needs teacher or  Rehab office at [phone]

## 2023-03-20 NOTE — Assessment & Plan Note (Signed)
-  Body mass index is 32.99 kg/m. -Low-calorie diet, portion control and increase physical activity discussed with patient.

## 2023-03-21 ENCOUNTER — Telehealth (INDEPENDENT_AMBULATORY_CARE_PROVIDER_SITE_OTHER): Payer: Self-pay

## 2023-03-21 ENCOUNTER — Telehealth: Payer: Self-pay

## 2023-03-21 NOTE — Telephone Encounter (Signed)
Patient is calling for his Given study results from 08/202/2024. Please advise.

## 2023-03-21 NOTE — Telephone Encounter (Signed)
Hi Anna, I think they mention to me that you are going to repeat this.  Please let me know when you have read it so I can go through it. Thanks

## 2023-03-21 NOTE — Transitions of Care (Post Inpatient/ED Visit) (Signed)
03/21/2023  Name: [name] MRN: [id number] DOB: [birth year]  Today's TOC FU Call Status: Today's TOC FU Call Status:: Successful TOC FU Call Completed TOC FU Call Complete Date: 03/21/23  Transition Care Management Follow-up Telephone Call Date of Discharge: 03/20/23 [facility] Type of Discharge: Inpatient Admission Primary Inpatient Discharge Diagnosis:: Pancytopenia How have you been since you were released from the hospital?: Better Any questions or concerns?: No  Items Reviewed: Did you receive and understand the discharge instructions provided?: Yes Medications obtained,verified, and reconciled?: Yes (Medications Reviewed) Any new allergies since your discharge?: No Dietary orders reviewed?: Yes Type of Diet Ordered:: Heart Healthy, Modified Carbohydrates Do you have support at home?: Yes People in Home: spouse Name of Support/Comfort Primary Source: [name]  Medications Reviewed Today: Medications Reviewed Today     Reviewed by Jodelle Gross, RN (Case Manager) on 03/21/23 at 1417  Med List Status: <None>   Medication Order Taking? Sig Documenting Provider Last Dose Status Informant  acetaminophen (TYLENOL) 500 MG tablet [id number] Yes Take 2 tablets (1,000 mg total) by mouth every 8 (eight) hours as needed for mild pain, fever or headache. Vassie Loll, MD Taking Active   atorvastatin (LIPITOR) 40 MG tablet [id number] Yes Take 1 tablet (40 mg total) by mouth daily. Mechele Claude, MD Taking Active Self, Pharmacy Records  cyanocobalamin 1000 MCG tablet [id number] Yes Take 1,000 mcg by mouth daily. [provider] Taking Active Self, Pharmacy Records  glimepiride (AMARYL) 4 MG tablet [id number] Yes Take 4 mg by mouth daily with breakfast. [provider] Taking Active Self, Pharmacy Records  Glucose Blood (BLOOD GLUCOSE TEST STRIPS) STRP [id number] Yes 1 each by In Vitro route in the morning, at noon, and at bedtime. May substitute  to any manufacturer covered by patient's insurance. Dx: O84.16 Mechele Claude, MD Taking Active Self, Pharmacy Records  Lancets [facility] [place]) MISC [id number] Yes TEST 3 TIME DAILY DX E11.40 Mechele Claude, MD Taking Active Self, Pharmacy Records  lisinopril (ZESTRIL) 40 MG tablet [id number] Yes Take 0.5 tablets (20 mg total) by mouth daily. Vassie Loll, MD Taking Active   MAGNESIUM PO [id number] Yes Take 420 mg by mouth. [provider] Taking Active Self, Pharmacy Records  metformin (FORTAMET) 500 MG (OSM) 24 hr tablet [id number] Yes Take 500 mg by mouth 2 (two) times daily with a meal. [provider] Taking Active Self, Pharmacy Records  metoprolol succinate (TOPROL-XL) 100 MG 24 hr tablet [id number] Yes Take 1 tablet (100 mg total) by mouth daily. Take with or immediately following a meal. Mechele Claude, MD Taking Active Self, Pharmacy Records  Multiple Vitamins-Minerals (CENTRUM SILVER PO) [id number] Yes Take 1 capsule by mouth daily. [provider] Taking Active Self, Pharmacy Records  nitroGLYCERIN (NITROSTAT) 0.4 MG SL tablet [id number] Yes DISSOLVE 1 TABLET UNDER TONGUE AS NEEDED FOR CHEST PAIN Stacks, Broadus John, MD Taking Active Self, Pharmacy Records  pantoprazole (PROTONIX) 40 MG tablet [id number] Yes Take 1 tablet (40 mg total) by mouth 2 (two) times daily. Vassie Loll, MD Taking Active   tamsulosin [facility]) 0.4 MG CAPS capsule [id number] Yes Take 2 capsules (0.8 mg total) by mouth daily after supper. TAKE 1 CAPSULE BY MOUTH EVERYDAY AT BEDTIME  Patient taking differently: Take 0.4 mg by mouth 2 (two) times daily.   Mechele Claude, MD Taking Active Self, Pharmacy Records  Med List Note (Ward, [name], CPhT 03/19/23 1859): [facility] to [facility] [id number]  Home Care and Equipment/Supplies: Were Home Health Services Ordered?: No Any new equipment or medical supplies ordered?: No  Functional Questionnaire: Do you need  assistance with bathing/showering or dressing?: No Do you need assistance with meal preparation?: No Do you need assistance with eating?: No Do you have difficulty maintaining continence: No Do you need assistance with getting out of bed/getting out of a chair/moving?: No Do you have difficulty managing or taking your medications?: No  Follow up appointments reviewed: PCP Follow-up appointment confirmed?: Yes Date of PCP follow-up appointment?: 04/10/23 Follow-up Provider: Dr. Darlyn Read Specialist [facility] Follow-up appointment confirmed?: NA Do you need transportation to your follow-up appointment?: No Do you understand care options if your condition(s) worsen?: Yes-patient verbalized understanding  SDOH Interventions Today    Flowsheet Row Most Recent Value  SDOH Interventions   Food Insecurity Interventions Intervention Not Indicated  Transportation Interventions Intervention Not Indicated     Jodelle Gross RN, BSN, CCM [facility] RN Care Coordinator/ Transitions of Care Direct Dial: [phone]  Fax: [phone]

## 2023-03-21 NOTE — Op Note (Signed)
Small Bowel Givens Capsule Study Procedure date:  03/19/23  Referring Provider:  Dr. Milagros Reap, NP PCP:  Dr. Mechele Claude, MD  Indication for procedure:   82 year old male with history of CAD, DM, hypertension, hepatic steatosis, OSA, who presented to [facility], ER due to abnormal outpatient labs. On presentation the ER, hemoglobin was 7.3. Iron indices consistent with IDA. Follows with the [place]. EGD and colonoscopy recently at the [place], but reports are not available. Chronically anticoagulated. Required 3 units PRBCs while nipatient. Heme negative. 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 EGD unrevealing. Capsule study has been placed to evaluate small bowel.     Findings:   Capsule successfully deployed into the duodenum. Complete to the cecum. No obvious bleeding lesions. Possible scant erosions noted but without overt bleeding or likely significant. 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 This was seen on numerous different images. Several were quite prominent and appeared to possibly arise from a polyp.    First Duodenal image: 00:02:41 First Cecal image: 03:13:55 Gastric Passage time: N/A Small Bowel Passage time:  3h 3m           Summary & Recommendations: 82 year old male on anticoagulation presenting with acute symptomatic anemia, notable IDA, receiving 3 units PRBCs. Colonoscopy and EGD reportedly done within last few months at the [place]; however, records not available. EGD unrevealing. 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   To be thorough, recommend CTE. These areas are too distal to be reached by our endoscopes via enteroscopy locally. If CTE is concerning, recommend referral to quaternary care for enteroscopy.   Needs CBC in 1 week. Will also attempt to  obtain outside colonoscopy records. Recommend referral to Hematology.

## 2023-03-25 ENCOUNTER — Other Ambulatory Visit (INDEPENDENT_AMBULATORY_CARE_PROVIDER_SITE_OTHER): Payer: Self-pay

## 2023-03-25 DIAGNOSIS — I2510 Atherosclerotic heart disease of native coronary artery without angina pectoris: Secondary | ICD-10-CM

## 2023-03-25 DIAGNOSIS — K921 Melena: Secondary | ICD-10-CM

## 2023-03-25 DIAGNOSIS — I10 Essential (primary) hypertension: Secondary | ICD-10-CM

## 2023-03-25 DIAGNOSIS — D649 Anemia, unspecified: Secondary | ICD-10-CM

## 2023-03-25 NOTE — Telephone Encounter (Signed)
Crystal:  Please let patient know that I was able to review his capsule study and also discussed with Dr. Marletta Lor. Looked to have scattered lymphangiectasias and some appearing quite prominent.Recommend CTE if he is willing.   Also needs CBC in 1 week. Thank you so much!  Ann: are we able to get outside colonoscopy records?

## 2023-03-25 NOTE — Telephone Encounter (Signed)
I spoke with the patient and made aware of the results of having scattered lymphangiectasias and some appearing prominent, and we recommend a CTE, he is agreeable to proceeding with the CTE.   He is also aware he will need a repeat cbc next week, with next Monday being labor day patient says he can not do the lab that day and he has an appointment with PCP Dr. Selina Cooley on Tuesday and wanted to see if he would draw this for [place]. I have placed the order for a cbc in epic to be done at local [facility]. I have cc'd this message to Dr. Darlyn Read as an [place]ain Childes.

## 2023-03-25 NOTE — Telephone Encounter (Signed)
I wasn't aware I needed to get outside colonoscopy records - where do I need to request them from

## 2023-03-26 ENCOUNTER — Inpatient Hospital Stay: Payer: Medicare HMO | Admitting: Family Medicine

## 2023-03-26 NOTE — Telephone Encounter (Signed)
Whoops, Ann, let me send to susan.  Darl Pikes: can we retrieve outside colonoscopy/EGD reports from [facility] [place]?

## 2023-03-26 NOTE — Telephone Encounter (Signed)
Ok, no problem!

## 2023-03-28 ENCOUNTER — Other Ambulatory Visit: Payer: Self-pay | Admitting: *Deleted

## 2023-03-28 ENCOUNTER — Encounter: Payer: Self-pay | Admitting: *Deleted

## 2023-03-28 DIAGNOSIS — I890 Lymphedema, not elsewhere classified: Secondary | ICD-10-CM

## 2023-03-28 NOTE — Telephone Encounter (Signed)
Pt informed that CTE is scheduled for 05/07/23, arrive at 9:00 am, NPO 4 hours prior. Pt asked to be given a reminder. I told him I would mail him a letter.

## 2023-03-28 NOTE — Telephone Encounter (Signed)
[employer] PA: Authorization Number: [id number] Case Number: [id number] Review Date: 03/28/2023 2:49:56 PM Expiration Date: 09/24/2023 Status: Your case has been Approved. The prior authorization you submitted, Case [id number], has been received. Additional case status notifications will be sent if you opted in for email notifications. Thank you.

## 2023-04-02 NOTE — Telephone Encounter (Signed)
Thanks, Darl Pikes! Let's ask the patient if he can please sign a release from [facility]. He said the endoscopy and colonoscopy were this year. I am curious if that is the case.

## 2023-04-02 NOTE — Telephone Encounter (Signed)
Release has been mailed to patient

## 2023-04-03 ENCOUNTER — Other Ambulatory Visit: Payer: Self-pay | Admitting: *Deleted

## 2023-04-03 ENCOUNTER — Other Ambulatory Visit: Payer: Medicare HMO

## 2023-04-03 DIAGNOSIS — E782 Mixed hyperlipidemia: Secondary | ICD-10-CM

## 2023-04-03 DIAGNOSIS — E1140 Type 2 diabetes mellitus with diabetic neuropathy, unspecified: Secondary | ICD-10-CM

## 2023-04-03 DIAGNOSIS — I10 Essential (primary) hypertension: Secondary | ICD-10-CM | POA: Diagnosis not present

## 2023-04-04 LAB — CBC WITH DIFFERENTIAL/PLATELET
Basophils Absolute: 0 10*3/uL (ref 0.0–0.2)
Basos: 1 %
EOS (ABSOLUTE): 0.1 10*3/uL (ref 0.0–0.4)
Eos: 2 %
Hematocrit: 31.7 % — ABNORMAL LOW (ref 37.5–51.0)
Hemoglobin: 9.9 g/dL — ABNORMAL LOW (ref 13.0–17.7)
Immature Grans (Abs): 0 10*3/uL (ref 0.0–0.1)
Immature Granulocytes: 0 %
Lymphocytes Absolute: 0.5 10*3/uL — ABNORMAL LOW (ref 0.7–3.1)
Lymphs: 19 %
MCH: 26.2 pg — ABNORMAL LOW (ref 26.6–33.0)
MCHC: 31.2 g/dL — ABNORMAL LOW (ref 31.5–35.7)
MCV: 84 fL (ref 79–97)
Monocytes Absolute: 0.3 10*3/uL (ref 0.1–0.9)
Monocytes: 10 %
Neutrophils Absolute: 1.8 10*3/uL (ref 1.4–7.0)
Neutrophils: 68 %
Platelets: 76 10*3/uL — CL (ref 150–450)
RBC: 3.78 x10E6/uL — ABNORMAL LOW (ref 4.14–5.80)
RDW: 16.9 % — ABNORMAL HIGH (ref 11.6–15.4)
WBC: 2.6 10*3/uL — ABNORMAL LOW (ref 3.4–10.8)

## 2023-04-04 LAB — CMP14+EGFR
ALT: 19 IU/L (ref 0–44)
AST: 29 IU/L (ref 0–40)
Albumin: 4.1 g/dL (ref 3.7–4.7)
Alkaline Phosphatase: 117 IU/L (ref 44–121)
BUN/Creatinine Ratio: 10 (ref 10–24)
BUN: 11 mg/dL (ref 8–27)
Bilirubin Total: 0.8 mg/dL (ref 0.0–1.2)
CO2: 18 mmol/L — ABNORMAL LOW (ref 20–29)
Calcium: 9.1 mg/dL (ref 8.6–10.2)
Chloride: 98 mmol/L (ref 96–106)
Creatinine, Ser: 1.09 mg/dL (ref 0.76–1.27)
Globulin, Total: 2.4 g/dL (ref 1.5–4.5)
Glucose: 276 mg/dL — ABNORMAL HIGH (ref 70–99)
Potassium: 4.4 mmol/L (ref 3.5–5.2)
Sodium: 133 mmol/L — ABNORMAL LOW (ref 134–144)
Total Protein: 6.5 g/dL (ref 6.0–8.5)
eGFR: 68 mL/min/{1.73_m2} (ref >59)

## 2023-04-04 LAB — LIPID PANEL
Chol/HDL Ratio: 2.3 ratio (ref 0.0–5.0)
Cholesterol, Total: 94 mg/dL — ABNORMAL LOW (ref 100–199)
HDL: 41 mg/dL (ref >39)
LDL Chol Calc (NIH): 37 mg/dL (ref 0–99)
Triglycerides: 75 mg/dL (ref 0–149)
VLDL Cholesterol Cal: 16 mg/dL (ref 5–40)

## 2023-04-04 NOTE — Telephone Encounter (Signed)
Hgb 9.9, improved from 2 weeks ago.   Awaiting CTE.

## 2023-04-04 NOTE — Telephone Encounter (Signed)
Please see labs from 04/03/2023. Thanks,

## 2023-04-10 ENCOUNTER — Ambulatory Visit (INDEPENDENT_AMBULATORY_CARE_PROVIDER_SITE_OTHER): Payer: Medicare HMO | Admitting: Family Medicine

## 2023-04-10 ENCOUNTER — Encounter: Payer: Self-pay | Admitting: Family Medicine

## 2023-04-10 VITALS — BP 138/53 | HR 75 | Temp 98.2°F | Ht 71.0 in | Wt 238.4 lb

## 2023-04-10 DIAGNOSIS — Z7984 Long term (current) use of oral hypoglycemic drugs: Secondary | ICD-10-CM | POA: Diagnosis not present

## 2023-04-10 DIAGNOSIS — R350 Frequency of micturition: Secondary | ICD-10-CM

## 2023-04-10 DIAGNOSIS — E1140 Type 2 diabetes mellitus with diabetic neuropathy, unspecified: Secondary | ICD-10-CM

## 2023-04-10 DIAGNOSIS — E782 Mixed hyperlipidemia: Secondary | ICD-10-CM

## 2023-04-10 DIAGNOSIS — I4891 Unspecified atrial fibrillation: Secondary | ICD-10-CM | POA: Diagnosis not present

## 2023-04-10 DIAGNOSIS — I10 Essential (primary) hypertension: Secondary | ICD-10-CM

## 2023-04-10 DIAGNOSIS — N401 Enlarged prostate with lower urinary tract symptoms: Secondary | ICD-10-CM

## 2023-04-10 MED ORDER — ATORVASTATIN CALCIUM 40 MG PO TABS
40.0000 mg | ORAL_TABLET | Freq: Every day | ORAL | 3 refills | Status: DC
Start: 2023-04-10 — End: 2024-02-17

## 2023-04-10 MED ORDER — GLIMEPIRIDE 4 MG PO TABS: 4.0000 mg | ORAL_TABLET | Freq: Every day | ORAL | 3 refills | Status: DC

## 2023-04-10 MED ORDER — TAMSULOSIN HCL 0.4 MG PO CAPS
0.8000 mg | ORAL_CAPSULE | Freq: Every day | ORAL | 2 refills | Status: DC
Start: 2023-04-10 — End: 2024-02-17

## 2023-04-10 MED ORDER — METFORMIN HCL ER (OSM) 500 MG PO TB24: 500.0000 mg | ORAL_TABLET | Freq: Two times a day (BID) | ORAL | 3 refills | Status: DC

## 2023-04-10 MED ORDER — METOPROLOL SUCCINATE ER 100 MG PO TB24: 100.0000 mg | ORAL_TABLET | Freq: Every day | ORAL | 3 refills | Status: DC

## 2023-04-10 NOTE — Progress Notes (Signed)
Subjective:  Patient ID: [name], male    DOB: 21-Apr-1941  Age: 82 y.o. MRN: [id number]  CC: Hospitalization Follow-up (Discharge 03/20/23, DX: symptomatic anemia)   HPI [name] presents for presents forFollow-up of diabetes. Patient checks blood sugar at home.   150 fasting and 200 postprandial Patient denies symptoms such as polyuria, polydipsia, excessive hunger, nausea No significant hypoglycemic spells noted. Medications reviewed. Pt reports taking them regularly without complication/adverse reaction being reported today.  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   He has a past surgical history that includes Coronary stent placement (08/2006); Colonoscopy (9/17/20012); Polypectomy (09/17/20012); Tonsillectomy; Esophagogastroduodenoscopy (egd) with propofol (N/A, 03/19/2023); and Givens capsule study (N/A, 03/19/2023).   His family history includes Cancer in his mother; Healthy in his son; Heart attack in his father; Heart disease in his father; Hypertension in his mother and sister; Irritable bowel syndrome in his maternal grandmother.He reports that he quit smoking about 52 years ago. His smoking use included cigarettes. He quit smokeless tobacco use about 33 years ago.  His  smokeless tobacco use included chew. He reports current alcohol use of about 2.0 standard drinks of alcohol per week. He reports that he does not use drugs.    ROS Review of Systems  Constitutional:  Negative for fever.  Respiratory:  Negative for shortness of breath.   Cardiovascular:  Negative for chest pain.  Musculoskeletal:  Negative for arthralgias.  Skin:  Negative for rash.    Objective:  BP (!) 138/53   Pulse 75   Temp 98.2 F (36.8 C)   Ht 5\' 11"  (1.803 m)   Wt 238 lb 6.4 oz (108.1 kg)   SpO2 95%   BMI 33.25 kg/m   BP Readings from Last 3 Encounters:  04/10/23 (!) 138/53  03/20/23 (!) 151/58  03/14/23 (!) 126/53    Wt Readings from Last 3 Encounters:  04/10/23 238 lb 6.4 oz (108.1 kg)  03/19/23 236 lb 8.9 oz (107.3 kg)  03/14/23 243 lb 9.6 oz (110.5 kg)     Physical Exam Vitals reviewed.  Constitutional:      Appearance: He is well-developed.  HENT:     Head: Normocephalic and atraumatic.     Right Ear: External ear normal.     Left Ear: External ear normal.     Mouth/Throat:     Pharynx: No oropharyngeal exudate or posterior oropharyngeal erythema.  Eyes:     Pupils: Pupils are equal, round, and reactive to light.  Cardiovascular:     Rate and Rhythm: Normal rate and regular rhythm.     Heart sounds: No murmur heard. Pulmonary:     Effort: No respiratory distress.     Breath sounds: Normal breath sounds.  Musculoskeletal:     Cervical back: Normal range  of motion and neck supple.  Neurological:     Mental Status: He is alert and oriented to person, place, and time.       Assessment & Plan:   Easten was seen today for hospitalization follow-up.  Diagnoses and all orders for this visit:  Essential hypertension  Type 2 diabetes mellitus with diabetic neuropathy, without long-term current use of insulin (HCC)  Atrial fibrillation, unspecified type (HCC)  Mixed hyperlipidemia -     atorvastatin (LIPITOR) 40 MG tablet; Take 1 tablet (40  mg total) by mouth daily.  Benign prostatic hyperplasia with urinary frequency -     tamsulosin (FLOMAX) 0.4 MG CAPS capsule; Take 2 capsules (0.8 mg total) by mouth daily after supper. TAKE 1 CAPSULE BY MOUTH EVERYDAY AT BEDTIME  Other orders -     glimepiride (AMARYL) 4 MG tablet; Take 1 tablet (4 mg total) by mouth daily with breakfast. -     metformin (FORTAMET) 500 MG (OSM) 24 hr tablet; Take 1 tablet (500 mg total) by mouth 2 (two) times daily with a meal. -     metoprolol succinate (TOPROL-XL) 100 MG 24 hr tablet; Take 1 tablet (100 mg total) by mouth daily. Take with or immediately following a meal.       I have changed Charlene G. Mcelhinney's glimepiride and metformin. I am also having him maintain his Multiple Vitamins-Minerals (CENTRUM SILVER PO), cyanocobalamin, MAGNESIUM PO, nitroGLYCERIN, BLOOD GLUCOSE TEST STRIPS, OneTouch Delica Plus Lancet33G, acetaminophen, lisinopril, pantoprazole, atorvastatin, metoprolol succinate, and tamsulosin.  Allergies as of 04/10/2023   No Known Allergies      Medication List        Accurate as of April 10, 2023 10:11 PM. If you have any questions, ask your nurse or doctor.          acetaminophen 500 MG tablet Commonly known as: TYLENOL Take 2 tablets (1,000 mg total) by mouth every 8 (eight) hours as needed for mild pain, fever or headache.   atorvastatin 40 MG tablet Commonly known as: LIPITOR Take 1 tablet (40 mg total) by mouth daily.   BLOOD GLUCOSE TEST STRIPS Strp 1 each by In Vitro route in the morning, at noon, and at bedtime. May substitute to any manufacturer covered by patient's insurance. Dx: E11.40   CENTRUM SILVER PO Take 1 capsule by mouth daily.   cyanocobalamin 1000 MCG tablet Take 1,000 mcg by mouth daily.   glimepiride 4 MG tablet Commonly known as: AMARYL Take 1 tablet (4 mg total) by mouth daily with breakfast.   lisinopril 40 MG tablet Commonly known as: ZESTRIL Take 0.5 tablets (20 mg total) by  mouth daily.   MAGNESIUM PO Take 420 mg by mouth.   metformin 500 MG (OSM) 24 hr tablet Commonly known as: FORTAMET Take 1 tablet (500 mg total) by mouth 2 (two) times daily with a meal.   metoprolol succinate 100 MG 24 hr tablet Commonly known as: TOPROL-XL Take 1 tablet (100 mg total) by mouth daily. Take with or immediately following a meal.   nitroGLYCERIN 0.4 MG SL tablet Commonly known as: NITROSTAT DISSOLVE 1 TABLET UNDER TONGUE AS NEEDED FOR CHEST PAIN   OneTouch Delica Plus Lancet33G Misc TEST 3 TIME DAILY DX E11.40   pantoprazole 40 MG tablet Commonly known as: Protonix Take 1 tablet (40 mg total) by mouth 2 (two) times daily.   tamsulosin 0.4 MG Caps capsule Commonly known as: FLOMAX Take 2 capsules (0.8 mg total) by mouth daily after supper. TAKE  1 CAPSULE BY MOUTH EVERYDAY AT BEDTIME What changed:  how much to take when to take this additional instructions         Follow-up: Return in about 1 month (around 05/10/2023) for anemia.  Mechele Claude, M.D.

## 2023-04-11 ENCOUNTER — Other Ambulatory Visit: Payer: Self-pay | Admitting: Family Medicine

## 2023-04-15 ENCOUNTER — Encounter: Payer: Medicare HMO | Attending: Family Medicine | Admitting: Nutrition

## 2023-04-15 ENCOUNTER — Encounter: Payer: Self-pay | Admitting: Nutrition

## 2023-04-15 VITALS — Ht 71.0 in | Wt 238.8 lb

## 2023-04-15 DIAGNOSIS — E782 Mixed hyperlipidemia: Secondary | ICD-10-CM | POA: Insufficient documentation

## 2023-04-15 DIAGNOSIS — E669 Obesity, unspecified: Secondary | ICD-10-CM | POA: Diagnosis not present

## 2023-04-15 DIAGNOSIS — Z713 Dietary counseling and surveillance: Secondary | ICD-10-CM | POA: Diagnosis not present

## 2023-04-15 DIAGNOSIS — I2510 Atherosclerotic heart disease of native coronary artery without angina pectoris: Secondary | ICD-10-CM | POA: Diagnosis not present

## 2023-04-15 DIAGNOSIS — K760 Fatty (change of) liver, not elsewhere classified: Secondary | ICD-10-CM | POA: Diagnosis not present

## 2023-04-15 DIAGNOSIS — E1140 Type 2 diabetes mellitus with diabetic neuropathy, unspecified: Secondary | ICD-10-CM | POA: Diagnosis not present

## 2023-04-15 NOTE — Progress Notes (Signed)
Medical Nutrition Therapy  Appointment Start time:  1400  Appointment End time:  1430  Primary concerns today: Dm Type 2, Obesity  Referral diagnosis: E11.8 Preferred learning style: Read  Learning readiness: Ready    NUTRITION ASSESSMENT Follow up Still has some issues with dizziness. A1C down to 7.2% from 7.5%.  Still taking Metformin and Glimiperide. FBS 160's Before dinner 140-160's. Trying to watch portions. Cutting back on how much he eats at night. Eating more greens. Walks to [employer] with cane daily. Lost 5 lbs.  Anthropometrics  Wt Readings from Last 3 Encounters:  04/10/23 238 lb 6.4 oz (108.1 kg)  03/19/23 236 lb 8.9 oz (107.3 kg)  03/14/23 243 lb 9.6 oz (110.5 kg)   Ht Readings from Last 3 Encounters:  04/10/23 5\' 11"  (1.803 m)  03/19/23 5\' 11"  (1.803 m)  03/14/23 5\' 11"  (1.803 m)   There is no height or weight on file to calculate BMI. @BMIFA @ Facility age limit for growth %iles is 20 years. Facility age limit for growth %iles is 20 years.    Clinical Medical Hx: Type 2 DM, HLD, HTN, OBesity,  cirrhosis of liver,CAD, Anemia, Neuropathy, CHF, BPH. Medications: Metformin and Glimeperide Labs:  Lab Results  Component Value Date   HGBA1C 7.2 (H) 03/14/2023    Notable Signs/Symptoms: None  Lifestyle & Dietary Hx Lives with his wife. He does the cooking. Walks with a cane. Limited mobility.  Estimated daily fluid intake: 60 oz Supplements: MVI, Vit B 12, Mag ox Sleep: 6-8 hrs Stress / self-care:  No issues Current average weekly physical activity:ADL  24-Hr Dietary Recall First Meal:Boiled egg, toast and fruit,  Second Meal: Chicken tenders, water Third Meal: Fruit, yogurt, Snack:  Beverages: water  Estimated Energy Needs Calories: 1800 Carbohydrate: 200g Protein: 135g Fat: 50g   NUTRITION DIAGNOSIS  NB-1.1 Food and nutrition-related knowledge deficit As related to Diabetes Type 2 uncontrolled .  As evidenced by A1C > 7.5%.   NUTRITION  INTERVENTION  Nutrition education (E-1) on the following topics:  Nutrition and Diabetes education provided on My Plate, CHO counting, meal planning, portion sizes, timing of meals, avoiding snacks between meals unless having a low blood sugar, target ranges for A1C and blood sugars, signs/symptoms and treatment of hyper/hypoglycemia, monitoring blood sugars, taking medications as prescribed, benefits of exercising 30 minutes per day and prevention of complications of DM.   Lifestyle Medicine  - Whole Food, Plant Predominant Nutrition is highly recommended: Eat Plenty of vegetables, Mushrooms, fruits, Legumes, Whole Grains, Nuts, seeds in lieu of processed meats, processed snacks/pastries red meat, poultry, eggs.    -It is better to avoid simple carbohydrates including: Cakes, Sweet Desserts, Ice Cream, Soda (diet and regular), Sweet Tea, Candies, Chips, Cookies, Store Bought Juices, Alcohol in Excess of  1-2 drinks a day, Lemonade,  Artificial Sweeteners, Doughnuts, Coffee Creamers, "Sugar-free" Products, etc, etc.  This is not a complete list.....  Exercise: If you are able: 30 -60 minutes a day ,4 days a week, or 150 minutes a week.  The longer the better.  Combine stretch, strength, and aerobic activities.  If you were told in the past that you have high risk for cardiovascular diseases, you may seek evaluation by your heart doctor prior to initiating moderate to intense exercise programs.   Handouts Provided Include  Lifestyle Medicine Know your numbers  Learning Style & Readiness for Change Teaching method utilized: Visual & Auditory  Demonstrated degree of understanding via: Teach Back  Barriers to learning/adherence to lifestyle  change: none  Go  Continue to eat whole plant based foods Increase walking to mailbox twice a day when you feel like you can   MONITORING & EVALUATION Dietary intake, weekly physical activity, and blood sugars and weight in 1 month.  Recommend to  change to Glipizide instead of Glimepiride for better blood sugar control.  Next Steps  Patient is to work on eating balanced whole plant based meals at times discussed. Marland Kitchen

## 2023-04-15 NOTE — Patient Instructions (Signed)
Goals  Continue to eat whole plant based foods Increase walking to mailbox twice a day when you feel like you can

## 2023-04-22 ENCOUNTER — Other Ambulatory Visit: Payer: Self-pay | Admitting: Cardiology

## 2023-04-22 ENCOUNTER — Other Ambulatory Visit: Payer: Self-pay | Admitting: Family Medicine

## 2023-05-07 ENCOUNTER — Ambulatory Visit (HOSPITAL_COMMUNITY)
Admission: RE | Admit: 2023-05-07 | Discharge: 2023-05-07 | Disposition: A | Discharge status: Home / Self Care | Payer: Medicare HMO | Source: Ambulatory Visit | Attending: Gastroenterology | Admitting: Gastroenterology

## 2023-05-07 DIAGNOSIS — K766 Portal hypertension: Secondary | ICD-10-CM | POA: Diagnosis not present

## 2023-05-07 DIAGNOSIS — K7460 Unspecified cirrhosis of liver: Secondary | ICD-10-CM | POA: Diagnosis not present

## 2023-05-07 DIAGNOSIS — I890 Lymphedema, not elsewhere classified: Secondary | ICD-10-CM | POA: Diagnosis not present

## 2023-05-07 DIAGNOSIS — K7689 Other specified diseases of liver: Secondary | ICD-10-CM | POA: Diagnosis not present

## 2023-05-07 DIAGNOSIS — R161 Splenomegaly, not elsewhere classified: Secondary | ICD-10-CM | POA: Diagnosis not present

## 2023-05-07 MED ORDER — IOHEXOL 300 MG/ML  SOLN
125.0000 mL | Freq: Once | INTRAMUSCULAR | Status: AC | PRN
  Administered 2023-05-07: 125 mL via INTRAVENOUS

## 2023-05-16 ENCOUNTER — Ambulatory Visit (INDEPENDENT_AMBULATORY_CARE_PROVIDER_SITE_OTHER): Payer: Medicare HMO | Admitting: Family Medicine

## 2023-05-16 ENCOUNTER — Encounter: Payer: Self-pay | Admitting: Family Medicine

## 2023-05-16 VITALS — BP 148/53 | HR 62 | Temp 98.1°F | Ht 71.0 in | Wt 239.4 lb

## 2023-05-16 DIAGNOSIS — Z7984 Long term (current) use of oral hypoglycemic drugs: Secondary | ICD-10-CM | POA: Diagnosis not present

## 2023-05-16 DIAGNOSIS — D509 Iron deficiency anemia, unspecified: Secondary | ICD-10-CM | POA: Diagnosis not present

## 2023-05-16 DIAGNOSIS — E1140 Type 2 diabetes mellitus with diabetic neuropathy, unspecified: Secondary | ICD-10-CM | POA: Diagnosis not present

## 2023-05-16 NOTE — Progress Notes (Signed)
Subjective:  Patient ID: [name], male    DOB: 30-Aug-1940  Age: 82 y.o. MRN: [id number]  CC: Follow-up and Anemia   HPI [name] presents for recheck of anemia and adjustment of diabetes meds. Doing well with both. No low glucose. Sugar ranges 130-200. No signs of blod loss including GI. Strength and energy are getting better. Staying active. No dyspnea.     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   He has a past surgical history that includes Coronary stent placement (08/2006); Colonoscopy (9/17/20012); Polypectomy (09/17/20012); Tonsillectomy; Esophagogastroduodenoscopy (egd) with propofol (N/A, 03/19/2023); and Givens capsule study (N/A, 03/19/2023).   His family history includes Cancer in his mother; Healthy in his son; Heart attack in his father; Heart disease in his father; Hypertension in his mother and sister; Irritable bowel syndrome in his maternal grandmother.He reports that he quit smoking about 52 years ago. His smoking use included cigarettes. He quit smokeless tobacco use about 33 years ago.  His smokeless tobacco use included chew. He reports current alcohol use of about 2.0 standard drinks of alcohol per week. He reports that he does not use drugs.    ROS Review of Systems  Constitutional:  Negative for fever.  Respiratory:  Negative for shortness of breath.   Cardiovascular:  Negative for chest  pain.  Musculoskeletal:  Negative for arthralgias.  Skin:  Negative for rash.    Objective:  BP (!) 148/53   Pulse 62   Temp 98.1 F (36.7 C)   Ht 5\' 11"  (1.803 m)   Wt 239 lb 6.4 oz (108.6 kg)   SpO2 97%   BMI 33.39 kg/m   BP Readings from Last 3 Encounters:  05/16/23 (!) 148/53  04/10/23 (!) 138/53  03/20/23 (!) 151/58    Wt Readings from Last 3 Encounters:  05/16/23 239 lb 6.4 oz (108.6 kg)  04/15/23 238 lb 12.8 oz (108.3 kg)  04/10/23 238 lb 6.4 oz (108.1 kg)     Physical Exam Vitals reviewed.  Constitutional:      Appearance: He is well-developed.  HENT:     Head: Normocephalic and atraumatic.     Right Ear: External ear normal.     Left Ear: External ear normal.     Mouth/Throat:     Pharynx: No oropharyngeal exudate or posterior oropharyngeal erythema.  Eyes:     Pupils: Pupils are equal, round, and reactive to light.  Cardiovascular:     Rate and Rhythm: Normal rate and regular rhythm.     Heart sounds: No murmur heard. Pulmonary:     Effort: No respiratory distress.     Breath sounds: Normal breath sounds.  Musculoskeletal:     Cervical back: Normal range of motion and neck supple.  Neurological:     Mental Status: He is alert and oriented to person, place, and time.       Assessment & Plan:   [name] was seen today for follow-up and anemia.  Diagnoses and all orders for this visit:  Type 2 diabetes mellitus with diabetic neuropathy, without long-term current use of insulin (HCC) -     Microalbumin / creatinine urine ratio  Iron deficiency anemia, unspecified iron deficiency anemia type -     CBC with Differential/Platelet       I am having [name] maintain his Multiple Vitamins-Minerals (CENTRUM SILVER PO), cyanocobalamin, MAGNESIUM PO, nitroGLYCERIN, OneTouch Delica Plus Lancet33G, acetaminophen, lisinopril, pantoprazole, atorvastatin, glimepiride, metoprolol succinate, tamsulosin, metformin, and OneTouch Verio.  Allergies as  of 05/16/2023   No Known Allergies      Medication List        Accurate as of May 16, 2023 10:27 AM. If you have any questions, ask your nurse or doctor.          acetaminophen 500 MG tablet Commonly known as: TYLENOL Take 2 tablets (1,000 mg total) by mouth every 8 (eight) hours as needed for mild pain, fever or headache.   atorvastatin 40 MG tablet Commonly known as: LIPITOR Take 1 tablet (40 mg total) by mouth daily.   CENTRUM SILVER PO Take 1 capsule by mouth daily.   cyanocobalamin 1000 MCG tablet Take 1,000 mcg by mouth daily.   glimepiride 4 MG tablet Commonly known as: AMARYL Take 1 tablet (4 mg total) by mouth daily with breakfast.   lisinopril 40 MG tablet Commonly known as: ZESTRIL Take 0.5 tablets (20 mg total) by mouth daily.   MAGNESIUM PO Take 420 mg by mouth.   metformin 500 MG (OSM) 24 hr tablet Commonly known as: FORTAMET TAKE 1 TABLET BY MOUTH 2 TIMES DAILY WITH A MEAL.   metoprolol succinate 100 MG 24 hr tablet Commonly known as: TOPROL-XL Take 1 tablet (100 mg total) by mouth daily. Take with or immediately following a meal.   nitroGLYCERIN 0.4 MG SL tablet Commonly known as: NITROSTAT DISSOLVE 1 TABLET UNDER TONGUE AS NEEDED FOR CHEST PAIN   OneTouch Delica Plus Lancet33G Misc TEST 3 TIME DAILY DX E11.40   OneTouch Verio test strip Generic drug: glucose blood TEST 3 TIME DAILY DX E11.40   pantoprazole 40 MG tablet Commonly known as: Protonix Take 1 tablet (40 mg total) by mouth 2 (two) times daily.   tamsulosin 0.4 MG Caps capsule Commonly known as: FLOMAX Take 2 capsules (0.8 mg total) by mouth daily after supper. TAKE 1 CAPSULE BY MOUTH EVERYDAY AT BEDTIME         Follow-up: Return in about 3 months (around 08/16/2023) for diabetes.  Mechele Claude, M.D.

## 2023-05-17 LAB — CBC WITH DIFFERENTIAL/PLATELET
Basophils Absolute: 0 10*3/uL (ref 0.0–0.2)
Basos: 0 %
EOS (ABSOLUTE): 0.1 10*3/uL (ref 0.0–0.4)
Eos: 2 %
Hematocrit: 29.4 % — ABNORMAL LOW (ref 37.5–51.0)
Hemoglobin: 9.4 g/dL — ABNORMAL LOW (ref 13.0–17.7)
Immature Grans (Abs): 0 10*3/uL (ref 0.0–0.1)
Immature Granulocytes: 0 %
Lymphocytes Absolute: 0.5 10*3/uL — ABNORMAL LOW (ref 0.7–3.1)
Lymphs: 19 %
MCH: 29.7 pg (ref 26.6–33.0)
MCHC: 32 g/dL (ref 31.5–35.7)
MCV: 93 fL (ref 79–97)
Monocytes Absolute: 0.3 10*3/uL (ref 0.1–0.9)
Monocytes: 10 %
Neutrophils Absolute: 1.9 10*3/uL (ref 1.4–7.0)
Neutrophils: 69 %
Platelets: 59 10*3/uL — CL (ref 150–450)
RBC: 3.16 x10E6/uL — ABNORMAL LOW (ref 4.14–5.80)
RDW: 19.8 % — ABNORMAL HIGH (ref 11.6–15.4)
WBC: 2.8 10*3/uL — ABNORMAL LOW (ref 3.4–10.8)

## 2023-05-17 LAB — MICROALBUMIN / CREATININE URINE RATIO
Creatinine, Urine: 40.9 mg/dL
Microalb/Creat Ratio: 20 mg/g{creat} (ref 0–29)
Microalbumin, Urine: 8.2 ug/mL

## 2023-06-04 ENCOUNTER — Ambulatory Visit: Payer: Medicare HMO | Admitting: Gastroenterology

## 2023-06-04 ENCOUNTER — Encounter: Payer: Self-pay | Admitting: Gastroenterology

## 2023-06-04 VITALS — BP 146/70 | HR 60 | Temp 98.6°F | Ht 71.0 in | Wt 234.0 lb

## 2023-06-04 DIAGNOSIS — K7460 Unspecified cirrhosis of liver: Secondary | ICD-10-CM | POA: Diagnosis not present

## 2023-06-04 DIAGNOSIS — K7469 Other cirrhosis of liver: Secondary | ICD-10-CM

## 2023-06-04 NOTE — Patient Instructions (Addendum)
Please keep follow-up with [facility].   They can keep following you for GI concerns. It is important you can continue to see them for the history of cirrhosis.  We will not need to see you as they will be following you.  Take care!  I enjoyed seeing you again today! I value our relationship and want to provide genuine, compassionate, and quality care. You may receive a survey regarding your visit with me, and I welcome your feedback! Thanks so much for taking the time to complete this. I look forward to seeing you again.      Gelene Mink, PhD, ANP-BC [facility]

## 2023-06-04 NOTE — Progress Notes (Signed)
Gastroenterology Office Note     Primary Care Physician:  Mechele Claude, MD  Primary Gastroenterologist: Lenn Sink   Chief Complaint   Chief Complaint  Patient presents with   Follow-up    Follow up on CT results     History of Present Illness   BJARNE FREDA is an 82 y.o. male presenting today with a history of CAD, MGUS, diabetes, hypertension, hepatic steatosis, OSA, who presented to [facility], ER due to abnormal outpatient labs. On presentation to the ER in Aug 2024, hemoglobin was 7.3. Follows with the [place]. Chronically anticoagulated. Received 3 units PRBCs.   EGD during admission with normal esophagus, stomach, and duodenum s/p capsule deployment.   Attempted to retrieve colonoscopy reports but none available from the [place]. Appears he was followed by Hematology at the [facility].   [facility] normal esophagus, mild portal gastropathy, normal duodenum.   [facility] Gastroenterology Col[facility]oscopy 2021: sessile 5 mm polyp at rectosigmoid junction, 2 mm polyp rectum,    CBC Oct 2024: Hgb 9.4, platelets 59, WBC count 2.8,    Capsule Aug 2024: Capsule successfully deployed into the duodenum. Complete to the cecum. No obvious bleeding lesions. Possible scant erosions noted but without overt bleeding or likely significant. 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 This was seen on numerous different images. Several were quite prominent and appeared to possibly arise from a polyp.   CTE OCt 2024 without any bowel wall thickening, mass, or inflammatory changes. 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    Appe[facility]. He presents today noting he is established with GI at the [place]. Just saw a few weeks ago. No overt GI bleeding. On anticoagulation. He notes he recently  had [place]. No mental status changes or confusion. No abdominal pain, N/V. No GERD.    Past Medical History:  Diagnosis Date   Coronary atherosclerosis of unspecified type of vessel, native or graft    Diabetes (HCC)    Diverticulosis    Educated about COVID-19 virus infection 12/17/2018   ETD (eustachian tube dysfunction) 07/07/2013   Hepatomegaly    Hx of colonic polyps    Metabolic syndrome 11/11/2012   Obesity, unspecified    Obstructive sleep apnea    Uses CPAP.    Other and unspecified hyperlipidemia    Unspecified essential hypertension     Past Surgical History:  Procedure Laterality Date   COLONOSCOPY  9/17/20012   CORONARY STENT PLACEMENT  08/2006   LAD   ESOPHAGOGASTRODUODENOSCOPY (EGD) WITH PROPOFOL N/A 03/19/2023   Procedure: ESOPHAGOGASTRODUODENOSCOPY (EGD) WITH PROPOFOL;  Surgeon: Dolores Frame, MD;  [facility];  Service: Gastroenterology;  Laterality: N/A;   GIVENS CAPSULE STUDY N/A 03/19/2023   Procedure: GIVENS CAPSULE STUDY;  Surgeon: Dolores Frame, MD;  Location: [facility];  Service: Gastroenterology;  Laterality: N/A;   POLYPECTOMY  09/17/20012   TONSILLECTOMY      Current Outpatient Medications  Medication Sig Dispense Refill   acetaminophen (TYLENOL) 500 MG tablet Take 2 tablets (1,000 mg total) by mouth every 8 (eight) hours as needed for mild pain, fever or headache.     atorvastatin (LIPITOR) 40 MG tablet Take 1 tablet (40 mg total) by mouth daily. 90 tablet 3   cyanocobalamin 1000 MCG tablet Take 1,000 mcg by mouth daily.  glimepiride (AMARYL) 4 MG tablet Take 1 tablet (4 mg total) by mouth daily with breakfast. 90 tablet 3   Lancets (ONETOUCH DELICA PLUS LANCET33G) MISC TEST 3 TIME DAILY DX E11.40 300 each 0   lisinopril (ZESTRIL) 40 MG tablet Take 0.5 tablets (20 mg total) by mouth daily.     MAGNESIUM PO Take 420 mg by mouth.     metformin (FORTAMET) 500 MG (OSM) 24 hr tablet TAKE 1 TABLET BY MOUTH 2 TIMES  DAILY WITH A MEAL. 180 tablet 3   metoprolol succinate (TOPROL-XL) 100 MG 24 hr tablet Take 1 tablet (100 mg total) by mouth daily. Take with or immediately following a meal. 90 tablet 3   Multiple Vitamins-Minerals (CENTRUM SILVER PO) Take 1 capsule by mouth daily.     nitroGLYCERIN (NITROSTAT) 0.4 MG SL tablet DISSOLVE 1 TABLET UNDER TONGUE AS NEEDED FOR CHEST PAIN 25 tablet prn   ONETOUCH VERIO test strip TEST 3 TIME DAILY DX E11.40 300 strip 3   tamsulosin (FLOMAX) 0.4 MG CAPS capsule Take 2 capsules (0.8 mg total) by mouth daily after supper. TAKE 1 CAPSULE BY MOUTH EVERYDAY AT BEDTIME 180 capsule 2   pantoprazole (PROTONIX) 40 MG tablet Take 1 tablet (40 mg total) by mouth 2 (two) times daily. 60 tablet 1   No current facility-administered medications for this visit.    Allergies as of 06/04/2023   (No Known Allergies)    Family History  Problem Relation Age of Onset   Heart disease Father        and Multiple family members on father Side    Heart attack Father    Cancer Mother    Hypertension Mother    Hypertension Sister    Healthy Son    Irritable bowel syndrome Maternal Grandmother    Colon cancer Neg Hx     Social History   Socioeconomic History   Marital status: Married    Spouse name: Not on file   Number of children: 1   Years of education: 12 years   Highest education level: Some college, no degree  Occupational History   Occupation: Marine scientist    Comment: Retired    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 He is married and lives at home with his wife. He has one adult son. Their teenage granddaughter has recently moved in with them.    Right handed    Caffeine~ does not use    Social Determinants of Health   Financial Resource Strain: Low Risk  (02/28/2023)   Overall Financial Resource Strain (CARDIA)    Difficulty of Paying Living Expenses: Not hard at all  Food Insecurity: No Food Insecurity (03/21/2023)   Hunger Vital Sign  Worried About Programme researcher, broadcasting/film/video in the Last Year: Never true    Ran Out of Food in the Last Year: Never true  Transportation Needs: No Transportation Needs (03/21/2023)   PRAPARE - Administrator, Civil Service (Medical): No    Lack of Transportation (Non-Medical): No  Physical Activity: Insufficiently Active (02/28/2023)   Exercise Vital Sign    Days of Exercise per Week: 3 days    Minutes of Exercise per Session: 30 min  Stress: No Stress Concern Present (02/28/2023)   [employer] of Occupational Health - Occupational Stress Questionnaire    Feeling of Stress : Not at all  Social Connections: Unknown (12/08/2021)   Received from [facility]   Social Network    Social Network: Not on file  Intimate Partner Violence: Not At Risk (03/18/2023)   Humiliation, Afraid, Rape, and Kick questionnaire    Fear of Current or Ex-Partner: No    Emotionally Abused: No    Physically Abused: No    Sexually Abused: No     Review of Systems   Gen: Denies any fever, chills, fatigue, weight loss, lack of appetite.  CV: Denies chest pain, heart palpitations, peripheral edema, syncope.  Resp: Denies shortness of breath at rest or with exertion. Denies wheezing or cough.  GI: Denies dysphagia or odynophagia. Denies jaundice, hematemesis, fecal incontinence. GU : Denies urinary burning,  urinary frequency, urinary hesitancy MS: Denies joint pain, muscle weakness, cramps, or limitation of movement.  Derm: Denies rash, itching, dry skin Psych: Denies depression, anxiety, memory loss, and confusion Heme: Denies bruising, bleeding, and enlarged lymph nodes.   Physical Exam   BP (!) 146/70   Pulse 60   Temp 98.6 F (37 C)   Ht 5\' 11"  (1.803 m)   Wt 234 lb (106.1 kg)   BMI 32.64 kg/m  General:   Alert and oriented. Pleasant and cooperative. Well-nourished and well-developed.  Head:  Normocephalic and atraumatic. Eyes:  Without icterus Abdomen:  +BS, soft, non-tender and non-distended. No HSM noted. No guarding or rebound. No masses appreciated.  Rectal:  Deferred  Msk:  Symmetrical without gross deformities. Normal posture. Extremities:  Without edema. Neurologic:  Alert and  oriented x4;  grossly normal neurologically. Skin:  Intact without significant lesions or rashes. Psych:  Alert and cooperative. Normal mood and affect.   Assessment/Plan   AESOP MILLHOUSE is an 82 y.o. male presenting today with a history of  CAD, MGUS, diabetes, hypertension, , OSA, recently inpatient with worsening anemia in setting of chronic anticoagulation and receiving 3 units PRBCs.  EGD and capsule completed as above, and last colonoscopy in 2021. CTE to wrap up evaluation without any small bowel concerns but did note cirrhosis with stigmata of portal hypertension.  Notably, he is already established with [facility] for cirrhosis care. I discussed thsi with him in depth today, and he notes he recently had imaging with them and just had a visit. He will continue care there.   No need to continue to follow patient, as he is already established with other practice.   Return prn. Continue close follow-up with established GI   Gelene Mink, PhD, [facility] Gastroenterology

## 2023-08-01 DIAGNOSIS — G4733 Obstructive sleep apnea (adult) (pediatric): Secondary | ICD-10-CM | POA: Diagnosis not present

## 2023-08-20 ENCOUNTER — Ambulatory Visit: Payer: Medicare HMO | Admitting: Family Medicine

## 2023-08-20 ENCOUNTER — Encounter: Payer: Self-pay | Admitting: Family Medicine

## 2023-08-20 VITALS — BP 140/55 | HR 56 | Temp 99.1°F | Ht 71.0 in | Wt 235.0 lb

## 2023-08-20 DIAGNOSIS — D696 Thrombocytopenia, unspecified: Secondary | ICD-10-CM

## 2023-08-20 DIAGNOSIS — Z23 Encounter for immunization: Secondary | ICD-10-CM | POA: Diagnosis not present

## 2023-08-20 DIAGNOSIS — I4891 Unspecified atrial fibrillation: Secondary | ICD-10-CM | POA: Diagnosis not present

## 2023-08-20 DIAGNOSIS — I10 Essential (primary) hypertension: Secondary | ICD-10-CM | POA: Diagnosis not present

## 2023-08-20 DIAGNOSIS — E1140 Type 2 diabetes mellitus with diabetic neuropathy, unspecified: Secondary | ICD-10-CM

## 2023-08-20 DIAGNOSIS — Z7984 Long term (current) use of oral hypoglycemic drugs: Secondary | ICD-10-CM | POA: Diagnosis not present

## 2023-08-20 DIAGNOSIS — E782 Mixed hyperlipidemia: Secondary | ICD-10-CM

## 2023-08-20 LAB — BAYER DCA HB A1C WAIVED: HB A1C (BAYER DCA - WAIVED): 8.8 % — ABNORMAL HIGH (ref 4.8–5.6)

## 2023-08-20 LAB — CBC WITH DIFFERENTIAL/PLATELET

## 2023-08-20 LAB — LIPID PANEL

## 2023-08-20 MED ORDER — GLIMEPIRIDE 4 MG PO TABS: 4.0000 mg | ORAL_TABLET | Freq: Two times a day (BID) | ORAL | Status: AC

## 2023-08-20 NOTE — Progress Notes (Signed)
Subjective:  Patient ID: [name],  male    DOB: 04-20-1941  Age: 83 y.o.    CC: No chief complaint on file.   HPI [name] presents for  follow-up of hypertension. Patient has no history of headache chest pain or shortness of breath or recent cough. Patient also denies symptoms of TIA such as numbness weakness lateralizing. Patient denies side effects from medication. States taking it regularly.  Patient also  in for follow-up of elevated cholesterol. Doing well without complaints on current medication. Denies side effects  including myalgia and arthralgia and nausea. Also in today for liver function testing. Currently no chest pain, shortness of breath or other cardiovascular related symptoms noted.  Follow-up of diabetes. Patient does check blood sugar at home. Readings run between 200 and 300. [facility] of [facility] for [facility].  Patient denies symptoms such as excessive hunger or urinary frequency, excessive hunger, nausea No significant hypoglycemic spells noted. Medications reviewed. Pt reports taking them regularly. Pt. denies complication/adverse reaction today.    History [name] has a past medical history of Coronary atherosclerosis of unspecified type of vessel, native or graft, Diabetes (HCC), Diverticulosis, Educated about COVID-19 virus infection (12/17/2018), ETD (eustachian tube dysfunction) (07/07/2013), Hepatomegaly, colonic polyps, Metabolic syndrome (11/11/2012), Obesity, unspecified, Obstructive sleep apnea, Other and unspecified hyperlipidemia, and Unspecified essential hypertension.   He has a past surgical history that includes Coronary stent placement (08/2006); Colonoscopy (9/17/20012); Polypectomy (09/17/20012); Tonsillectomy; Esophagogastroduodenoscopy (egd) with propofol (N/A, 03/19/2023); and Givens capsule study (N/A, 03/19/2023).   His family history includes Cancer in his mother; Healthy in his son; Heart attack in his father; Heart disease in his father;  Hypertension in his mother and sister; Irritable bowel syndrome in his maternal grandmother.He reports that he quit smoking about 52 years ago. His smoking use included cigarettes. He quit smokeless tobacco use about 34 years ago.  His smokeless tobacco use included chew. He reports current alcohol use of about 2.0 standard drinks of alcohol per week. He reports that he does not use drugs.  Current Outpatient Medications on File Prior to Visit  Medication Sig Dispense Refill   acetaminophen (TYLENOL) 500 MG tablet Take 2 tablets (1,000 mg total) by mouth every 8 (eight) hours as needed for mild pain, fever or headache.     atorvastatin (LIPITOR) 40 MG tablet Take 1 tablet (40 mg total) by mouth daily. 90 tablet 3   cyanocobalamin 1000 MCG tablet Take 1,000 mcg by mouth daily.     Lancets (ONETOUCH DELICA PLUS LANCET33G) MISC TEST 3 TIME DAILY DX E11.40 300 each 0   lisinopril (ZESTRIL) 40 MG tablet Take 0.5 tablets (20 mg total) by mouth daily.     MAGNESIUM PO Take 420 mg by mouth.     metformin (FORTAMET) 500 MG (OSM) 24 hr tablet TAKE 1 TABLET BY MOUTH 2 TIMES DAILY WITH A MEAL. 180 tablet 3   metoprolol succinate (TOPROL-XL) 100 MG 24 hr tablet Take 1 tablet (100 mg total) by mouth daily. Take with or immediately following a meal. 90 tablet 3   Multiple Vitamins-Minerals (CENTRUM SILVER PO) Take 1 capsule by mouth daily.     nitroGLYCERIN (NITROSTAT) 0.4 MG SL tablet DISSOLVE 1 TABLET UNDER TONGUE AS NEEDED FOR CHEST PAIN 25 tablet prn   ONETOUCH VERIO test strip TEST 3 TIME DAILY DX E11.40 300 strip 3   pantoprazole (PROTONIX) 40 MG tablet Take 1 tablet (40 mg total) by mouth 2 (two) times daily. 60 tablet  1   tamsulosin (FLOMAX) 0.4 MG CAPS capsule Take 2 capsules (0.8 mg total) by mouth daily after supper. TAKE 1 CAPSULE BY MOUTH EVERYDAY AT BEDTIME 180 capsule 2   No current facility-administered medications on file prior to visit.    ROS Review of Systems  Constitutional:  Negative  for fever.  Respiratory:  Negative for shortness of breath.   Cardiovascular:  Negative for chest pain.  Musculoskeletal:  Negative for arthralgias.  Skin:  Negative for rash.    Objective:  BP (!) 140/55   Pulse (!) 56   Temp 99.1 F (37.3 C)   Ht 5\' 11"  (1.803 m)   Wt 235 lb (106.6 kg)   SpO2 98%   BMI 32.78 kg/m   BP Readings from Last 3 Encounters:  08/20/23 (!) 140/55  06/04/23 (!) 146/70  05/16/23 (!) 148/53    Wt Readings from Last 3 Encounters:  08/20/23 235 lb (106.6 kg)  06/04/23 234 lb (106.1 kg)  05/16/23 239 lb 6.4 oz (108.6 kg)     Physical Exam Vitals reviewed.  Constitutional:      Appearance: He is well-developed.  HENT:     Head: Normocephalic and atraumatic.     Right Ear: External ear normal.     Left Ear: External ear normal.     Mouth/Throat:     Pharynx: No oropharyngeal exudate or posterior oropharyngeal erythema.  Eyes:     Pupils: Pupils are equal, round, and reactive to light.  Cardiovascular:     Rate and Rhythm: Normal rate and regular rhythm.     Heart sounds: No murmur heard. Pulmonary:     Effort: No respiratory distress.     Breath sounds: Normal breath sounds.  Musculoskeletal:     Cervical back: Normal range of motion and neck supple.  Neurological:     Mental Status: He is alert and oriented to person, place, and time.     Diabetic Foot Exam - Simple   No data filed     Lab Results  Component Value Date   HGBA1C 8.8 (H) 08/20/2023   HGBA1C 7.2 (H) 03/14/2023   HGBA1C 6.4 (H) 04/04/2022    Assessment & Plan:   Diagnoses and all orders for this visit:  Type 2 diabetes mellitus with diabetic neuropathy, without long-term current use of insulin (HCC) -     Bayer DCA Hb A1c Waived  Essential hypertension -     CBC with Differential/Platelet  Atrial fibrillation, unspecified type (HCC) -     CMP14+EGFR  Thrombocytopenia (HCC) -     CBC with Differential/Platelet -     CMP14+EGFR  Mixed hyperlipidemia -      Lipid panel  Encounter for immunization -     Flu Vaccine Trivalent High Dose (Fluad)  Other orders -     glimepiride (AMARYL) 4 MG tablet; Take 1 tablet (4 mg total) by mouth in the morning and at bedtime.   I have changed [name]'s glimepiride. I am also having him maintain his Multiple Vitamins-Minerals (CENTRUM SILVER PO), cyanocobalamin, MAGNESIUM PO, nitroGLYCERIN, OneTouch Delica Plus Lancet33G, acetaminophen, lisinopril, pantoprazole, atorvastatin, metoprolol succinate, tamsulosin, metformin, and OneTouch Verio.  Meds ordered this encounter  Medications   glimepiride (AMARYL) 4 MG tablet    Sig: Take 1 tablet (4 mg total) by mouth in the morning and at bedtime.   Diabetes, uncontrolled. Needs to increase exercise, work on carb control in the diet. [facility] so I suggested he discuss  switch from Amaryl to a different more potent agent such as [place] or GLP 1.  Follow-up: Return in about 6 months (around 02/17/2024).  Mechele Claude, M.D.

## 2023-08-21 LAB — CMP14+EGFR
ALT: 22 IU/L (ref 0–44)
AST: 26 IU/L (ref 0–40)
Albumin: 3.8 g/dL (ref 3.7–4.7)
Alkaline Phosphatase: 119 IU/L (ref 44–121)
BUN/Creatinine Ratio: 11 (ref 10–24)
BUN: 14 mg/dL (ref 8–27)
Bilirubin Total: 0.8 mg/dL (ref 0.0–1.2)
CO2: 21 mmol/L (ref 20–29)
Calcium: 9 mg/dL (ref 8.6–10.2)
Chloride: 99 mmol/L (ref 96–106)
Creatinine, Ser: 1.26 mg/dL (ref 0.76–1.27)
Globulin, Total: 2.3 g/dL (ref 1.5–4.5)
Glucose: 397 mg/dL — ABNORMAL HIGH (ref 70–99)
Potassium: 4.6 mmol/L (ref 3.5–5.2)
Sodium: 136 mmol/L (ref 134–144)
Total Protein: 6.1 g/dL (ref 6.0–8.5)
eGFR: 57 mL/min/{1.73_m2} — ABNORMAL LOW (ref >59)

## 2023-08-21 LAB — LIPID PANEL
Cholesterol, Total: 86 mg/dL — ABNORMAL LOW (ref 100–199)
HDL: 38 mg/dL — ABNORMAL LOW (ref >39)
LDL CALC COMMENT:: 2.3 ratio (ref 0.0–5.0)
LDL Chol Calc (NIH): 30 mg/dL (ref 0–99)
Triglycerides: 88 mg/dL (ref 0–149)
VLDL Cholesterol Cal: 18 mg/dL (ref 5–40)

## 2023-08-21 LAB — CBC WITH DIFFERENTIAL/PLATELET
Basos: 0 %
EOS (ABSOLUTE): 0 10*3/uL (ref 0.0–0.2)
Eos: 2 %
Hematocrit: 31.9 % — ABNORMAL LOW (ref 37.5–51.0)
Hemoglobin: 10.3 g/dL — ABNORMAL LOW (ref 13.0–17.7)
Immature Granulocytes: 0 %
Immature Granulocytes: 0 10*3/uL (ref 0.0–0.1)
Lymphs: 19 %
MCH: 31 pg (ref 26.6–33.0)
MCHC: 32.3 g/dL (ref 31.5–35.7)
MCV: 96 fL (ref 79–97)
Monocytes Absolute: 0.1 10*3/uL (ref 0.0–0.4)
Monocytes Absolute: 0.2 10*3/uL (ref 0.1–0.9)
Monocytes: 9 %
Neutrophils Absolute: 0.5 10*3/uL — ABNORMAL LOW (ref 0.7–3.1)
Neutrophils Absolute: 1.7 10*3/uL (ref 1.4–7.0)
Neutrophils: 70 %
RBC: 3.32 x10E6/uL — ABNORMAL LOW (ref 4.14–5.80)
RDW: 13.9 % (ref 11.6–15.4)
WBC: 2.5 10*3/uL — CL (ref 3.4–10.8)

## 2023-09-16 ENCOUNTER — Ambulatory Visit: Payer: Medicare HMO | Admitting: Family Medicine

## 2023-10-08 ENCOUNTER — Encounter: Payer: Medicare HMO | Attending: Family Medicine | Admitting: Nutrition

## 2023-10-08 ENCOUNTER — Encounter: Payer: Self-pay | Admitting: Nutrition

## 2023-10-08 VITALS — Ht 71.0 in | Wt 231.0 lb

## 2023-10-08 DIAGNOSIS — E1140 Type 2 diabetes mellitus with diabetic neuropathy, unspecified: Secondary | ICD-10-CM | POA: Diagnosis not present

## 2023-10-08 DIAGNOSIS — K760 Fatty (change of) liver, not elsewhere classified: Secondary | ICD-10-CM | POA: Insufficient documentation

## 2023-10-08 DIAGNOSIS — E782 Mixed hyperlipidemia: Secondary | ICD-10-CM | POA: Diagnosis not present

## 2023-10-08 DIAGNOSIS — I2510 Atherosclerotic heart disease of native coronary artery without angina pectoris: Secondary | ICD-10-CM | POA: Insufficient documentation

## 2023-10-08 DIAGNOSIS — I10 Essential (primary) hypertension: Secondary | ICD-10-CM | POA: Diagnosis not present

## 2023-10-08 NOTE — Progress Notes (Signed)
 Medical Nutrition Therapy  Appointment Start time:  68 Appointment End time:  1115  Primary concerns today: Dm Type 2, Obesity  Referral diagnosis: E11.8 Preferred learning style: Read  Learning readiness: Ready    NUTRITION ASSESSMENT Follow up   Was taken off of B12 and put on iron pills. He is eating 2 meals per day. Eats breakfast and lunch and then only eats yogurt for dinner. Eats salads. Needs more balanced meals of plant proteins and high fiber foods. Walks with a cane. Frustrated with not losing weight. Mobility is limited. Only dizzy when he stands up or moves around fast. Cut out salt and avoiding processed foods. Sees Dr. Fransico Michael at the [facility]. Had talked about starting insulin last visit. BS are high in the 200-300's. He needs insulin. He is agreeable to start insulin. He will call Dr. Fransico Michael office to make appt to get started on insulin. A1C up to 8.8% Lost 4 lbs since last visit.  Anthropometrics  Wt Readings from Last 3 Encounters:  10/08/23 231 lb (104.8 kg)  08/20/23 235 lb (106.6 kg)  06/04/23 234 lb (106.1 kg)   Ht Readings from Last 3 Encounters:  10/08/23 5\' 11"  (1.803 m)  08/20/23 5\' 11"  (1.803 m)  06/04/23 5\' 11"  (1.803 m)   Body mass index is 32.22 kg/m. @BMIFA @ Facility age limit for growth %iles is 20 years. Facility age limit for growth %iles is 20 years.  Clinical Medical Hx: Type 2 DM, HLD, HTN, OBesity,  cirrhosis of liver,CAD, Anemia, Neuropathy, CHF, BPH. Medications: Metformin and Glimeperide Labs:  Lab Results  Component Value Date   HGBA1C 8.8 (H) 08/20/2023    Notable Signs/Symptoms: None  Lifestyle & Dietary Hx Lives with his wife. He does the cooking. Walks with a cane. Limited mobility.  Estimated daily fluid intake: 60 oz Supplements: MVI, Vit B 12, Mag ox Sleep: 6-8 hrs Stress / self-care:  No issues Current average weekly physical activity:ADL  24-Hr Dietary Recall First Meal:Boiled egg, toast and fruit,  Second Meal:  Chicken tenders, water Third Meal: Fruit, yogurt, Snack:  Beverages: water  Estimated Energy Needs Calories: 1800 Carbohydrate: 200g Protein: 135g Fat: 50g   NUTRITION DIAGNOSIS  NB-1.1 Food and nutrition-related knowledge deficit As related to Diabetes Type 2 uncontrolled .  As evidenced by A1C > 7.5%.   NUTRITION INTERVENTION  Nutrition education (E-1) on the following topics:  Nutrition and Diabetes education provided on My Plate, CHO counting, meal planning, portion sizes, timing of meals, avoiding snacks between meals unless having a low blood sugar, target ranges for A1C and blood sugars, signs/symptoms and treatment of hyper/hypoglycemia, monitoring blood sugars, taking medications as prescribed, benefits of exercising 30 minutes per day and prevention of complications of DM.   Lifestyle Medicine  - Whole Food, Plant Predominant Nutrition is highly recommended: Eat Plenty of vegetables, Mushrooms, fruits, Legumes, Whole Grains, Nuts, seeds in lieu of processed meats, processed snacks/pastries red meat, poultry, eggs.    -It is better to avoid simple carbohydrates including: Cakes, Sweet Desserts, Ice Cream, Soda (diet and regular), Sweet Tea, Candies, Chips, Cookies, Store Bought Juices, Alcohol in Excess of  1-2 drinks a day, Lemonade,  Artificial Sweeteners, Doughnuts, Coffee Creamers, "Sugar-free" Products, etc, etc.  This is not a complete list.....  Exercise: If you are able: 30 -60 minutes a day ,4 days a week, or 150 minutes a week.  The longer the better.  Combine stretch, strength, and aerobic activities.  If you were told in the past  that you have high risk for cardiovascular diseases, you may seek evaluation by your heart doctor prior to initiating moderate to intense exercise programs.   Handouts Provided Include  Lifestyle Medicine Know your numbers  Learning Style & Readiness for Change Teaching method utilized: Visual & Auditory  Demonstrated degree of  understanding via: Teach Back  Barriers to learning/adherence to lifestyle change: none  Go Call MD to schedule appt to start insulin to improve blood sugar control.   MONITORING & EVALUATION Dietary intake, weekly physical activity, and blood sugars and weight in 1 month.  Recommend to change to Glipizide instead of Glimepiride for better blood sugar control.  Next Steps  Patient is to work on eating balanced whole plant based meals at times discussed. Marland Kitchen

## 2023-10-08 NOTE — Patient Instructions (Signed)
 Call Dr. Juluis Mire office to make appt to start insulin.

## 2023-11-20 ENCOUNTER — Encounter: Payer: Self-pay | Admitting: Nutrition

## 2023-11-20 ENCOUNTER — Encounter: Attending: Family Medicine | Admitting: Nutrition

## 2023-11-20 DIAGNOSIS — I2510 Atherosclerotic heart disease of native coronary artery without angina pectoris: Secondary | ICD-10-CM | POA: Diagnosis not present

## 2023-11-20 DIAGNOSIS — E1140 Type 2 diabetes mellitus with diabetic neuropathy, unspecified: Secondary | ICD-10-CM | POA: Diagnosis not present

## 2023-11-20 DIAGNOSIS — E782 Mixed hyperlipidemia: Secondary | ICD-10-CM | POA: Diagnosis not present

## 2023-11-20 DIAGNOSIS — I10 Essential (primary) hypertension: Secondary | ICD-10-CM | POA: Diagnosis not present

## 2023-11-20 DIAGNOSIS — K760 Fatty (change of) liver, not elsewhere classified: Secondary | ICD-10-CM | POA: Diagnosis not present

## 2023-11-20 NOTE — Progress Notes (Signed)
 Medical Nutrition Therapy  Appointment Start time:  32 Appointment End time:  1115  Primary concerns today: Dm Type 2, Obesity  Referral diagnosis: E11.8 Preferred learning style: Read  Learning readiness: Ready    NUTRITION ASSESSMENT Follow up   Fortamet  500 mg BID and Glimepiride  4 mg a day. Testing twice a day. FBS 150 Evening 230-300's [facility] Lou[facility] til May.  Knows he needs to get started on insulin but can't get an appt til May. Drinking 4-5 bottles of water per day Eating more vegetables.  Lost 3 lbs.  Anthropometrics  Wt Readings from Last 3 Encounters:  10/08/23 231 lb (104.8 kg)  08/20/23 235 lb (106.6 kg)  06/04/23 234 lb (106.1 kg)   Ht Readings from Last 3 Encounters:  10/08/23 5\' 11"  (1.803 m)  08/20/23 5\' 11"  (1.803 m)  06/04/23 5\' 11"  (1.803 m)   There is no height or weight on file to calculate BMI. @BMIFA @ Facility age limit for growth %iles is 20 years. Facility age limit for growth %iles is 20 years.  Clinical Medical Hx: Type 2 DM, HLD, HTN, OBesity,  cirrhosis of liver,CAD, Anemia, Neuropathy, CHF, BPH. Medications: Metformin  and Glimeperide Labs:  Lab Results  Component Value Date   HGBA1C 8.8 (H) 08/20/2023    Notable Signs/Symptoms: None  Lifestyle & Dietary Hx Lives with his wife. He does the cooking. HOH Has hearting aides Walks with a cane. Limited mobility.  Meter 7 day avg 172 mg/dl. Estimated daily fluid intake: 60 oz Supplements: MVI, Vit B 12, Mag ox Sleep: 6-8 hrs Stress / self-care:  No issues Current average weekly physical activity:ADL  24-Hr Dietary Recall B) bran flakes with almond milk and blueberries, coffee decaf plain L) Baked chicken, green beans, sweet potato,  SF Jello, water Lightly salted nuts  D) Bologna and onion sandwich on [employer] bread, water,  SF Sprite  Estimated Energy Needs Calories: 1800 Carbohydrate: 200g Protein: 135g Fat: 50g   NUTRITION DIAGNOSIS  NB-1.1 Food  and nutrition-related knowledge deficit As related to Diabetes Type 2 uncontrolled .  As evidenced by A1C > 7.5%.   NUTRITION INTERVENTION  Nutrition education (E-1) on the following topics:  Nutrition and Diabetes education provided on My Plate, CHO counting, meal planning, portion sizes, timing of meals, avoiding snacks between meals unless having a low blood sugar, target ranges for A1C and blood sugars, signs/symptoms and treatment of hyper/hypoglycemia, monitoring blood sugars, taking medications as prescribed, benefits of exercising 30 minutes per day and prevention of complications of DM.   Lifestyle Medicine  - Whole Food, Plant Predominant Nutrition is highly recommended: Eat Plenty of vegetables, Mushrooms, fruits, Legumes, Whole Grains, Nuts, seeds in lieu of processed meats, processed snacks/pastries red meat, poultry, eggs.    -It is better to avoid simple carbohydrates including: Cakes, Sweet Desserts, Ice Cream, Soda (diet and regular), Sweet Tea, Candies, Chips, Cookies, Store Bought Juices, Alcohol in Excess of  1-2 drinks a day, Lemonade,  Artificial Sweeteners, Doughnuts, Coffee Creamers, "Sugar-free" Products, etc, etc.  This is not a complete list.....  Exercise: If you are able: 30 -60 minutes a day ,4 days a week, or 150 minutes a week.  The longer the better.  Combine stretch, strength, and aerobic activities.  If you were told in the past that you have high risk for cardiovascular diseases, you may seek evaluation by your heart doctor prior to initiating moderate to intense exercise programs.   Handouts Provided Include  Lifestyle Medicine Know your numbers  Learning Style & Readiness for Change Teaching method utilized: Visual & Auditory  Demonstrated degree of understanding via: Teach Back  Barriers to learning/adherence to lifestyle change: none  Goals Keep focusing on eating fruits and vegeteables from garden Avoid processed foods Keep appointment with Dr.  Heywood Louder.   MONITORING & EVALUATION Dietary intake, weekly physical activity, and blood sugars and weight in 3 months. Plan: Needs to start insulin for better blood sugar control   Next Steps  Patient is to work on eating balanced whole plant based meals at times discussed. Aaron Aas

## 2023-11-20 NOTE — Patient Instructions (Signed)
 Keep focusing on eating fruits and vegeteables from garden Avoid processed foods Keep appointment with Dr. Heywood Louder.

## 2023-12-12 ENCOUNTER — Ambulatory Visit: Admitting: Cardiology

## 2023-12-17 DIAGNOSIS — G4733 Obstructive sleep apnea (adult) (pediatric): Secondary | ICD-10-CM | POA: Diagnosis not present

## 2024-02-17 ENCOUNTER — Ambulatory Visit (INDEPENDENT_AMBULATORY_CARE_PROVIDER_SITE_OTHER): Payer: Medicare HMO | Admitting: Family Medicine

## 2024-02-17 ENCOUNTER — Ambulatory Visit: Payer: Self-pay | Admitting: Family Medicine

## 2024-02-17 ENCOUNTER — Telehealth: Payer: Self-pay

## 2024-02-17 ENCOUNTER — Encounter: Payer: Self-pay | Admitting: Family Medicine

## 2024-02-17 VITALS — BP 139/55 | HR 58 | Temp 98.2°F | Ht 71.0 in | Wt 229.0 lb

## 2024-02-17 DIAGNOSIS — G4733 Obstructive sleep apnea (adult) (pediatric): Secondary | ICD-10-CM

## 2024-02-17 DIAGNOSIS — D696 Thrombocytopenia, unspecified: Secondary | ICD-10-CM | POA: Diagnosis not present

## 2024-02-17 DIAGNOSIS — R350 Frequency of micturition: Secondary | ICD-10-CM

## 2024-02-17 DIAGNOSIS — I4891 Unspecified atrial fibrillation: Secondary | ICD-10-CM

## 2024-02-17 DIAGNOSIS — N401 Enlarged prostate with lower urinary tract symptoms: Secondary | ICD-10-CM

## 2024-02-17 DIAGNOSIS — E1140 Type 2 diabetes mellitus with diabetic neuropathy, unspecified: Secondary | ICD-10-CM | POA: Diagnosis not present

## 2024-02-17 DIAGNOSIS — E782 Mixed hyperlipidemia: Secondary | ICD-10-CM

## 2024-02-17 LAB — BAYER DCA HB A1C WAIVED: HB A1C (BAYER DCA - WAIVED): 7.6 % — ABNORMAL HIGH (ref 4.8–5.6)

## 2024-02-17 MED ORDER — ATORVASTATIN CALCIUM 40 MG PO TABS: 40.0000 mg | ORAL_TABLET | Freq: Every day | ORAL | 3 refills | Status: AC

## 2024-02-17 MED ORDER — METFORMIN HCL ER (OSM) 500 MG PO TB24: 500.0000 mg | ORAL_TABLET | Freq: Two times a day (BID) | ORAL | 3 refills | Status: AC

## 2024-02-17 MED ORDER — METOPROLOL SUCCINATE ER 100 MG PO TB24: 100.0000 mg | ORAL_TABLET | Freq: Every day | ORAL | 3 refills | Status: DC

## 2024-02-17 MED ORDER — TAMSULOSIN HCL 0.4 MG PO CAPS
0.8000 mg | ORAL_CAPSULE | Freq: Every day | ORAL | 2 refills | Status: AC
Start: 2024-02-17 — End: ?

## 2024-02-17 NOTE — Telephone Encounter (Signed)
 Updated in chart. Thank you. LS

## 2024-02-17 NOTE — Progress Notes (Signed)
 Subjective:  Patient ID: [name],  male    DOB: 1940-11-03  Age: 83 y.o.    CC: Medical Management of Chronic Issues   HPI [name] presents for  follow-up of hypertension. Patient has no history of headache chest pain or shortness of breath or recent cough. Patient also denies symptoms of TIA such as numbness weakness lateralizing. Patient denies side effects from medication. States taking it regularly.  Patient also  in for follow-up of elevated cholesterol. Doing well without complaints on current medication. Denies side effects  including myalgia and arthralgia and nausea. Also in today for liver function testing. Currently no chest pain, shortness of breath or other cardiovascular related symptoms noted. Follow-up of diabetes. Patient does check blood sugar at home. Readings run between 200+ fasting  and a little higher before supper. Patient denies symptoms such as excessive hunger or urinary frequency, excessive hunger, nausea No significant hypoglycemic spells noted. Medications reviewed. Pt reports taking them regularly. Pt. denies complication/adverse reaction today. [facility]  [facility] on Lantus insulin 10 units q AM, before breakfast.    History [name] has a past medical history of Coronary atherosclerosis of unspecified type of vessel, native or graft, Diabetes (HCC), Diverticulosis, Educated about COVID-19 virus infection (12/17/2018), ETD (eustachian tube dysfunction) (07/07/2013), Hepatomegaly, colonic polyps, Metabolic syndrome (11/11/2012), Obesity, unspecified, Obstructive sleep apnea, Other and unspecified hyperlipidemia, and Unspecified essential hypertension.   [name] has a past surgical history that includes Coronary stent placement (08/2006); Colonoscopy (9/17/20012); Polypectomy (09/17/20012); Tonsillectomy; Esophagogastroduodenoscopy (egd) with propofol  (N/A, 03/19/2023); and Givens capsule study (N/A, 03/19/2023).   His family history includes Cancer in his  mother; Healthy in his son; Heart attack in his father; Heart disease in his father; Hypertension in his mother and sister; Irritable bowel syndrome in his maternal grandmother.[name] reports that [name] quit smoking about 52 years ago. His smoking use included cigarettes. [name] quit smokeless tobacco use about 34 years ago.  His smokeless tobacco use included chew. [name] reports current alcohol use of about 2.0 standard drinks of alcohol per week. [name] reports that [name] does not use drugs.  Current Outpatient Medications on File Prior to Visit  Medication Sig Dispense Refill   acetaminophen  (TYLENOL ) 500 MG tablet Take 2 tablets (1,000 mg total) by mouth every 8 (eight) hours as needed for mild pain, fever or headache.     cyanocobalamin  1000 MCG tablet Take 1,000 mcg by mouth daily.     glimepiride  (AMARYL ) 4 MG tablet Take 1 tablet (4 mg total) by mouth in the morning and at bedtime.     Insulin Glargine Solostar (LANTUS) 100 UNIT/ML Solostar Pen Inject 10 Units into the skin daily. Inject 10 units every morning     Lancets (ONETOUCH DELICA PLUS LANCET33G) MISC TEST 3 TIME DAILY DX E11.40 300 each 0   lisinopril  (ZESTRIL ) 40 MG tablet Take 0.5 tablets (20 mg total) by mouth daily.     MAGNESIUM PO Take 420 mg by mouth.     Multiple Vitamins-Minerals (CENTRUM SILVER PO) Take 1 capsule by mouth daily.     nitroGLYCERIN  (NITROSTAT ) 0.4 MG SL tablet DISSOLVE 1 TABLET UNDER TONGUE AS NEEDED FOR CHEST PAIN 25 tablet prn   ONETOUCH VERIO test strip TEST 3 TIME DAILY DX E11.40 300 strip 3   pantoprazole  (PROTONIX ) 40 MG tablet Take 1 tablet (40 mg total) by mouth 2 (two) times daily. 60 tablet 1   No current facility-administered medications on file prior to visit.  ROS Review of Systems  Constitutional:  Negative for fever.  Respiratory:  Negative for shortness of breath.   Cardiovascular:  Negative for chest pain.  Musculoskeletal:  Negative for arthralgias.  Skin:  Negative for rash.    Objective:  BP (!)  139/55   Pulse (!) 58   Temp 98.2 F (36.8 C)   Ht 5' 11 (1.803 m)   Wt 229 lb (103.9 kg)   SpO2 97%   BMI 31.94 kg/m   BP Readings from Last 3 Encounters:  02/17/24 (!) 139/55  08/20/23 (!) 140/55  06/04/23 (!) 146/70    Wt Readings from Last 3 Encounters:  02/17/24 229 lb (103.9 kg)  11/20/23 228 lb (103.4 kg)  10/08/23 231 lb (104.8 kg)    Lab Results  Component Value Date   HGBA1C 7.6 (H) 02/17/2024   HGBA1C 8.8 (H) 08/20/2023   HGBA1C 7.2 (H) 03/14/2023    Physical Exam Vitals reviewed.  Constitutional:      Appearance: [name] is well-developed.  HENT:     Head: Normocephalic and atraumatic.     Right Ear: External ear normal.     Left Ear: External ear normal.     Mouth/Throat:     Pharynx: No oropharyngeal exudate or posterior oropharyngeal erythema.  Eyes:     Pupils: Pupils are equal, round, and reactive to light.  Cardiovascular:     Rate and Rhythm: Normal rate and regular rhythm.     Heart sounds: No murmur heard. Pulmonary:     Effort: No respiratory distress.     Breath sounds: Normal breath sounds.  Musculoskeletal:     Cervical back: Normal range of motion and neck supple.  Neurological:     Mental Status: [name] is alert and oriented to person, place, and time.         Assessment & Plan:  Type 2 diabetes mellitus with diabetic neuropathy, without long-term current use of insulin (HCC) -     Bayer DCA Hb A1c Waived  Atrial fibrillation, unspecified type (HCC) -     CBC with Differential/Platelet -     CMP14+EGFR  Thrombocytopenia (HCC) -     CBC with Differential/Platelet  Mixed hyperlipidemia -     Lipid panel -     CMP14+EGFR -     Atorvastatin  Calcium ; Take 1 tablet (40 mg total) by mouth daily.  Dispense: 90 tablet; Refill: 3  Benign prostatic hyperplasia with urinary frequency -     Tamsulosin  HCl; Take 2 capsules (0.8 mg total) by mouth daily after supper. TAKE 1 CAPSULE BY MOUTH EVERYDAY AT BEDTIME  Dispense: 180 capsule;  Refill: 2  OSA (obstructive sleep apnea) -     For home use only DME continuous positive airway pressure (CPAP)  Other orders -     Metoprolol  Succinate ER; Take 1 tablet (100 mg total) by mouth daily. Take with or immediately following a meal.  Dispense: 90 tablet; Refill: 3 -     metFORMIN  HCl ER (OSM); Take 1 tablet (500 mg total) by mouth 2 (two) times daily with a meal.  Dispense: 180 tablet; Refill: 3  [facility]. Walking some, uses  a cane. Increase exercise to 5 X a week  Follow-up: Return in about 3 months (around 05/19/2024).  Butler Der, M.D.

## 2024-02-17 NOTE — Telephone Encounter (Signed)
 Copied from CRM [id number]. Topic: General - Other >> Feb 17, 2024  3:03 PM Sophia H wrote: Reason for CRM: Patient is calling in to speak with Dr. Zollie nurse - medication the patient was discussing with you is ferrous sulfate. Ty

## 2024-02-18 ENCOUNTER — Telehealth: Payer: Self-pay

## 2024-02-18 LAB — CBC WITH DIFFERENTIAL/PLATELET
Basophils Absolute: 0 x10E3/uL (ref 0.0–0.2)
Basos: 1 %
EOS (ABSOLUTE): 0 x10E3/uL (ref 0.0–0.4)
Eos: 1 %
Hematocrit: 34.5 % — ABNORMAL LOW (ref 37.5–51.0)
Hemoglobin: 11.8 g/dL — ABNORMAL LOW (ref 13.0–17.7)
Immature Grans (Abs): 0 x10E3/uL (ref 0.0–0.1)
Immature Granulocytes: 0 %
Lymphocytes Absolute: 0.6 x10E3/uL — ABNORMAL LOW (ref 0.7–3.1)
Lymphs: 19 %
MCH: 36.5 pg — ABNORMAL HIGH (ref 26.6–33.0)
MCHC: 34.2 g/dL (ref 31.5–35.7)
MCV: 107 fL — ABNORMAL HIGH (ref 79–97)
Monocytes Absolute: 0.3 x10E3/uL (ref 0.1–0.9)
Monocytes: 9 %
Neutrophils Absolute: 2.3 x10E3/uL (ref 1.4–7.0)
Neutrophils: 70 %
Platelets: 54 x10E3/uL — CL (ref 150–450)
RBC: 3.23 x10E6/uL — ABNORMAL LOW (ref 4.14–5.80)
RDW: 11.8 % (ref 11.6–15.4)
WBC: 3.3 x10E3/uL — ABNORMAL LOW (ref 3.4–10.8)

## 2024-02-18 LAB — CMP14+EGFR
ALT: 37 IU/L (ref 0–44)
AST: 52 IU/L — ABNORMAL HIGH (ref 0–40)
Albumin: 3.8 g/dL (ref 3.7–4.7)
Alkaline Phosphatase: 145 IU/L — ABNORMAL HIGH (ref 44–121)
BUN/Creatinine Ratio: 8 — ABNORMAL LOW (ref 10–24)
BUN: 9 mg/dL (ref 8–27)
Bilirubin Total: 1.4 mg/dL — ABNORMAL HIGH (ref 0.0–1.2)
CO2: 20 mmol/L (ref 20–29)
Calcium: 9 mg/dL (ref 8.6–10.2)
Chloride: 99 mmol/L (ref 96–106)
Creatinine, Ser: 1.13 mg/dL (ref 0.76–1.27)
Globulin, Total: 2.5 g/dL (ref 1.5–4.5)
Glucose: 372 mg/dL — ABNORMAL HIGH (ref 70–99)
Potassium: 4.6 mmol/L (ref 3.5–5.2)
Sodium: 135 mmol/L (ref 134–144)
Total Protein: 6.3 g/dL (ref 6.0–8.5)
eGFR: 65 mL/min/1.73 (ref >59)

## 2024-02-18 LAB — LIPID PANEL
Chol/HDL Ratio: 2 ratio (ref 0.0–5.0)
Cholesterol, Total: 88 mg/dL — ABNORMAL LOW (ref 100–199)
HDL: 43 mg/dL (ref >39)
LDL Chol Calc (NIH): 31 mg/dL (ref 0–99)
Triglycerides: 63 mg/dL (ref 0–149)
VLDL Cholesterol Cal: 14 mg/dL (ref 5–40)

## 2024-02-18 NOTE — Telephone Encounter (Signed)
 Noted

## 2024-02-18 NOTE — Telephone Encounter (Signed)
 Copied from CRM [id number]. Topic: Clinical - Lab/Test Results >> Feb 18, 2024  2:52 PM Delon DASEN wrote: Reason for CRM: gave results verbatim, no questions

## 2024-02-21 ENCOUNTER — Telehealth: Payer: Self-pay | Admitting: *Deleted

## 2024-02-21 NOTE — Telephone Encounter (Signed)
 TC to pt - received fax from [facility] that pt does not have [facility] authorization and recent prescriptions will need to be [facility] to a [facility]. Pt was not happy about this because he gets all of his medicines except the Metoprolol  from the [place], Metoprolol  is the ONLY one he gets from Dr. Zollie which he just got refilled in June and he hs another refill on the bottle so he does not want me to send in anymore refills to the [facility]. He will take care of all of this and will [facility]ol  too.

## 2024-02-25 ENCOUNTER — Telehealth: Payer: Self-pay | Admitting: Family Medicine

## 2024-02-25 NOTE — Telephone Encounter (Signed)
 Spoke to pt and clarified medications with him. LS

## 2024-02-25 NOTE — Telephone Encounter (Unsigned)
 Copied from CRM #[id number]. Topic: General - Other >> Feb 25, 2024 10:53 AM Donna BRAVO wrote: Reason for CRM: patient is requesting a Dr Matthews nurse to call him back, patient will not say what exactly about,   Patient phone [phone]  Patient was informed their call will be returned before end of business day.

## 2024-03-24 ENCOUNTER — Telehealth: Payer: Self-pay | Admitting: Family Medicine

## 2024-03-24 NOTE — Telephone Encounter (Signed)
 I need more information.  I need to know the diagnosis for which she is seeing the dietitian.  What is the goal/purpose of the treatment.  What is the name of this person and where is she located.

## 2024-03-24 NOTE — Telephone Encounter (Unsigned)
 Copied from CRM [id number]. Topic: Referral - Question >> Mar 24, 2024  9:06 AM Carlatta H wrote: Reason for CRM: Patient has an appointment with a dietitian tomorrow but the referral needs to be updated in epic for appointment//

## 2024-03-25 ENCOUNTER — Other Ambulatory Visit: Payer: Self-pay | Admitting: Family Medicine

## 2024-03-25 ENCOUNTER — Encounter: Attending: Family Medicine | Admitting: Nutrition

## 2024-03-25 DIAGNOSIS — E1140 Type 2 diabetes mellitus with diabetic neuropathy, unspecified: Secondary | ICD-10-CM | POA: Diagnosis not present

## 2024-03-25 DIAGNOSIS — K760 Fatty (change of) liver, not elsewhere classified: Secondary | ICD-10-CM | POA: Insufficient documentation

## 2024-03-25 DIAGNOSIS — E782 Mixed hyperlipidemia: Secondary | ICD-10-CM | POA: Insufficient documentation

## 2024-03-25 DIAGNOSIS — I2510 Atherosclerotic heart disease of native coronary artery without angina pectoris: Secondary | ICD-10-CM | POA: Diagnosis not present

## 2024-03-25 DIAGNOSIS — I10 Essential (primary) hypertension: Secondary | ICD-10-CM | POA: Insufficient documentation

## 2024-03-25 NOTE — Progress Notes (Signed)
 Medical Nutrition Therapy  Appointment Start time:  17 Appointment End time:  1115  Primary concerns today: Dm Type 2, Obesity  Referral diagnosis: E11.8 Preferred learning style: Read  Learning readiness: Ready    NUTRITION ASSESSMENT Follow up   Lantus 14 units in am before eating breakfast FBS 203 mg/dl.   4 pm 230's. Drinking 4-5 bottles of water per day Sees Dr. Harrold in 2 months at the [facility]. Sees PCP at [facility] next week. He reports he will be working with Dr. Harrold to increase insulin  slowly at visits.  Encouraged to keep working on eating meals on time and focus on more healthier foods..   Anthropometrics  Wt Readings from Last 3 Encounters:  02/17/24 229 lb (103.9 kg)  11/20/23 228 lb (103.4 kg)  10/08/23 231 lb (104.8 kg)   Ht Readings from Last 3 Encounters:  02/17/24 5' 11 (1.803 m)  11/20/23 5' 11 (1.803 m)  10/08/23 5' 11 (1.803 m)   There is no height or weight on file to calculate BMI. @BMIFA @ Facility age limit for growth %iles is 20 years. Facility age limit for growth %iles is 20 years.  Clinical Medical Hx: Type 2 DM, HLD, HTN, OBesity,  cirrhosis of liver,CAD, Anemia, Neuropathy, CHF, BPH. Medications: Metformin  and Glimeperide Labs:  Lab Results  Component Value Date   HGBA1C 7.6 (H) 02/17/2024    Notable Signs/Symptoms: None  Lifestyle & Dietary Hx Lives with his wife. He does the cooking. HOH Has hearting aides Walks with a cane. Limited mobility.  Meter 7 day avg 172 mg/dl. Estimated daily fluid intake: 60 oz Supplements: MVI, Vit B 12, Mag ox Sleep: 6-8 hrs Stress / self-care:  No issues Current average weekly physical activity:ADL  24-Hr Dietary Recall B) bran flakes with almond milk and blueberries, coffee decaf plain L) vegetables from church home coming; misc salads, didn't eat the fried chicken, SF cake, water D) boiled shrimp, blueberries and yogurt.  Water    Estimated Energy Needs Calories: 1800 Carbohydrate:  200g Protein: 135g Fat: 50g   NUTRITION DIAGNOSIS  NB-1.1 Food and nutrition-related knowledge deficit As related to Diabetes Type 2 uncontrolled .  As evidenced by A1C > 7.5%.   NUTRITION INTERVENTION  Nutrition education (E-1) on the following topics:  Nutrition and Diabetes education provided on My Plate, CHO counting, meal planning, portion sizes, timing of meals, avoiding snacks between meals unless having a low blood sugar, target ranges for A1C and blood sugars, signs/symptoms and treatment of hyper/hypoglycemia, monitoring blood sugars, taking medications as prescribed, benefits of exercising 30 minutes per day and prevention of complications of DM.   Lifestyle Medicine  - Whole Food, Plant Predominant Nutrition is highly recommended: Eat Plenty of vegetables, Mushrooms, fruits, Legumes, Whole Grains, Nuts, seeds in lieu of processed meats, processed snacks/pastries red meat, poultry, eggs.    -It is better to avoid simple carbohydrates including: Cakes, Sweet Desserts, Ice Cream, Soda (diet and regular), Sweet Tea, Candies, Chips, Cookies, Store Bought Juices, Alcohol in Excess of  1-2 drinks a day, Lemonade,  Artificial Sweeteners, Doughnuts, Coffee Creamers, Sugar-free Products, etc, etc.  This is not a complete list.....  Exercise: If you are able: 30 -60 minutes a day ,4 days a week, or 150 minutes a week.  The longer the better.  Combine stretch, strength, and aerobic activities.  If you were told in the past that you have high risk for cardiovascular diseases, you may seek evaluation by your heart doctor prior to initiating  moderate to intense exercise programs.   Handouts Provided Include  Lifestyle Medicine Know your numbers  Learning Style & Readiness for Change Teaching method utilized: Visual & Auditory  Demonstrated degree of understanding via: Teach Back  Barriers to learning/adherence to lifestyle change: none  Goals Keep focusing on eating fruits and  vegeteables from garden Avoid processed foods Keep appointment with Dr. Hershal.   MONITORING & EVALUATION Dietary intake, weekly physical activity, and blood sugars and weight in 3 months. Plan:  Next Steps  Patient is to work on eating balanced whole plant based meals at times discussed. [name]

## 2024-03-25 NOTE — Telephone Encounter (Signed)
Referral placed, as requested [facility] 

## 2024-03-25 NOTE — Telephone Encounter (Signed)
 Based on the chart notes, patient had a visit with Ronal Duke [facility]) today for Type 2 DM and Obesity.

## 2024-03-26 NOTE — Telephone Encounter (Signed)
Referral sent to [facility] as Patient requested.

## 2024-03-27 NOTE — Telephone Encounter (Signed)
 I called and spoke with patient and made him aware that referral has been updated. Patient voiced understanding.

## 2024-04-01 ENCOUNTER — Encounter: Payer: Self-pay | Admitting: Nutrition

## 2024-04-01 NOTE — Patient Instructions (Addendum)
 Follow Dr. Estanislado recommendations on insulin increases Try to eat meals on time and drink water Avoid snacks unless low blood sugars. Keep focusing on eating fruits and vegeteables from garden Avoid processed foods Keep appointment with Dr. Hershal.

## 2024-05-19 ENCOUNTER — Ambulatory Visit: Admitting: Family Medicine

## 2024-05-27 ENCOUNTER — Ambulatory Visit

## 2024-05-27 VITALS — BP 139/55 | HR 58 | Ht 71.0 in | Wt 229.0 lb

## 2024-05-27 DIAGNOSIS — Z0000 Encounter for general adult medical examination without abnormal findings: Secondary | ICD-10-CM | POA: Diagnosis not present

## 2024-05-27 NOTE — Progress Notes (Signed)
 Subjective:   [name] is a 83 y.o. who presents for a Medicare Wellness preventive visit.  As a reminder, Annual Wellness Visits don't include a physical exam, and some assessments may be limited, especially if this visit is performed virtually. We may recommend an in-person follow-up visit with your provider if needed.  Visit Complete: Virtual I connected with  [name] on 05/27/24 by a audio enabled telemedicine application and verified that I am speaking with the correct person using two identifiers.  Patient Location: Home  Provider Location: Home Office  I discussed the limitations of evaluation and management by telemedicine. The patient expressed understanding and agreed to proceed.  Vital Signs: Because this visit was a virtual/telehealth visit, some criteria may be missing or patient reported. Any vitals not documented were not able to be obtained and vitals that have been documented are patient reported.  VideoDeclined- This patient declined Librarian, academic. Therefore the visit was completed with audio only.  Persons Participating in Visit: Patient.  AWV Questionnaire: No: Patient Medicare AWV questionnaire was not completed prior to this visit.  Cardiac Risk Factors include: advanced age (>55men, >56 women);diabetes mellitus;dyslipidemia;hypertension;male gender;obesity (BMI >30kg/m2);smoking/ tobacco exposure     Objective:    Today's Vitals   05/27/24 1003  BP: (!) 139/55  Pulse: (!) 58  Weight: 229 lb (103.9 kg)  Height: 5' 11 (1.803 m)   Body mass index is 31.94 kg/m.     05/27/2024   10:11 AM 03/18/2023    1:24 AM 02/28/2023   10:06 AM 04/19/2021    9:58 AM 11/19/2019    1:00 PM 07/15/2019    1:44 PM 06/30/2019   11:24 AM  Advanced Directives  Does Patient Have a Medical Advance Directive? No No No No No No No  Would patient like information on creating a medical advance directive?  No - Patient declined Yes  (MAU/Ambulatory/Procedural Areas - Information given) No - Patient declined No - Patient declined No - Patient declined No - Patient declined    Current Medications (verified) Outpatient Encounter Medications as of 05/27/2024  Medication Sig   acetaminophen  (TYLENOL ) 500 MG tablet Take 2 tablets (1,000 mg total) by mouth every 8 (eight) hours as needed for mild pain, fever or headache.   atorvastatin  (LIPITOR) 40 MG tablet Take 1 tablet (40 mg total) by mouth daily.   cyanocobalamin  1000 MCG tablet Take 1,000 mcg by mouth daily.   glimepiride  (AMARYL ) 4 MG tablet Take 1 tablet (4 mg total) by mouth in the morning and at bedtime.   Insulin Glargine Solostar (LANTUS) 100 UNIT/ML Solostar Pen Inject 10 Units into the skin daily. Inject 10 units every morning   Lancets (ONETOUCH DELICA PLUS LANCET33G) MISC TEST 3 TIME DAILY DX E11.40   lisinopril  (ZESTRIL ) 40 MG tablet Take 0.5 tablets (20 mg total) by mouth daily.   MAGNESIUM PO Take 420 mg by mouth.   metformin  (FORTAMET ) 500 MG (OSM) 24 hr tablet Take 1 tablet (500 mg total) by mouth 2 (two) times daily with a meal.   metoprolol  succinate (TOPROL -XL) 100 MG 24 hr tablet Take 1 tablet (100 mg total) by mouth daily. Take with or immediately following a meal.   Multiple Vitamins-Minerals (CENTRUM SILVER PO) Take 1 capsule by mouth daily.   nitroGLYCERIN  (NITROSTAT ) 0.4 MG SL tablet DISSOLVE 1 TABLET UNDER TONGUE AS NEEDED FOR CHEST PAIN   ONETOUCH VERIO test strip TEST 3 TIME DAILY DX E11.40   pantoprazole  (PROTONIX )  40 MG tablet Take 1 tablet (40 mg total) by mouth 2 (two) times daily.   tamsulosin  (FLOMAX ) 0.4 MG CAPS capsule Take 2 capsules (0.8 mg total) by mouth daily after supper. TAKE 1 CAPSULE BY MOUTH EVERYDAY AT BEDTIME   No facility-administered encounter medications on file as of 05/27/2024.    Allergies (verified) Patient has no known allergies.   History: Past Medical History:  Diagnosis Date   Coronary atherosclerosis of  unspecified type of vessel, native or graft    Diabetes (HCC)    Diverticulosis    Educated about COVID-19 virus infection 12/17/2018   ETD (eustachian tube dysfunction) 07/07/2013   Hepatomegaly    Hx of colonic polyps    Metabolic syndrome 11/11/2012   Obesity, unspecified    Obstructive sleep apnea    Uses CPAP.    Other and unspecified hyperlipidemia    Unspecified essential hypertension    Past Surgical History:  Procedure Laterality Date   COLONOSCOPY  9/17/20012   CORONARY STENT PLACEMENT  08/2006   LAD   ESOPHAGOGASTRODUODENOSCOPY (EGD) WITH PROPOFOL  N/A 03/19/2023   Procedure: ESOPHAGOGASTRODUODENOSCOPY (EGD) WITH PROPOFOL ;  Surgeon: Eartha Angelia Sieving, MD;  Location: [facility];  Service: Gastroenterology;  Laterality: N/A;   GIVENS CAPSULE STUDY N/A 03/19/2023   Procedure: GIVENS CAPSULE STUDY;  Surgeon: Eartha Angelia Sieving, MD;  Location: [facility];  Service: Gastroenterology;  Laterality: N/A;   POLYPECTOMY  09/17/20012   TONSILLECTOMY     Family History  Problem Relation Age of Onset   Heart disease Father        and Multiple family members on father Side    Heart attack Father    Cancer Mother    Hypertension Mother    Hypertension Sister    Healthy Son    Irritable bowel syndrome Maternal Grandmother    Colon cancer Neg Hx    Social History   Socioeconomic History   Marital status: Married    Spouse name: Not on file   Number of children: 1   Years of education: 12 years   Highest education level: Some college, no degree  Occupational History   Occupation: marine scientist    Comment: Retired    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 He is married and lives at home with his wife. He has one adult son. Their teenage granddaughter has recently moved in with them.    Right handed    Caffeine~ does not use    Social Drivers of Corporate Investment Banker Strain: Low Risk  (05/27/2024)   Overall Financial Resource Strain (CARDIA)    Difficulty of Paying Living Expenses: Not hard at all  Food Insecurity: No Food Insecurity (  05/27/2024)   Hunger Vital Sign    Worried About Running Out of Food in the Last Year: Never true    Ran Out of Food in the Last Year: Never true  Transportation Needs: No Transportation Needs (05/27/2024)   PRAPARE - Administrator, Civil Service (Medical): No    Lack of Transportation (Non-Medical): No  Physical Activity: Insufficiently Active (05/27/2024)   Exercise Vital Sign    Days of Exercise per Week: 3 days    Minutes of Exercise per Session: 30 min  Stress: No Stress Concern Present (05/27/2024)   [employer] of Occupational Health - Occupational Stress Questionnaire    Feeling of Stress: Not at all  Social Connections: Socially Integrated (05/27/2024)   Social Connection and Isolation Panel    Frequency of Communication with Friends and Family: More than three times a week    Frequency of Social Gatherings with Friends and Family: More than three times a week    Attends Religious Services: More than 4 times per year    Active Member of [employer] or Organizations: Yes    Attends Engineer, Structural: More than 4 times per year    Marital Status: Married    Tobacco Counseling Counseling given: Yes    Clinical Intake:  Pre-visit preparation  completed: Yes  Pain : No/denies pain     BMI - recorded: 31.94 Nutritional Status: BMI > 30  Obese Nutritional Risks: None Diabetes: Yes  Lab Results  Component Value Date   HGBA1C 7.6 (H) 02/17/2024   HGBA1C 8.8 (H) 08/20/2023   HGBA1C 7.2 (H) 03/14/2023     How often do you need to have someone help you when you read instructions, pamphlets, or other written materials from your doctor or pharmacy?: 1 - Never  Interpreter Needed?: No      Activities of Daily Living     05/27/2024   10:07 AM  In your present state of health, do you have any difficulty performing the following activities:  Hearing? 1  Vision? 0  Difficulty concentrating or making decisions? 0  Walking or climbing stairs? 1  Dressing or bathing? 0  Doing errands, shopping? 1  Comment mostly pt's wife  Quarry Manager and eating ? N  Using the Toilet? N  In the past six months, have you accidently leaked urine? Y  Do you have problems with loss of bowel control? N  Managing your Medications? N  Managing your Finances? N  Housekeeping or managing your Housekeeping? N    Patient Care Team: Zollie Lowers, MD as PCP - General (Family Medicine) Rolan Ezra RAMAN, MD as Consulting Physician (Cardiology) Abran Norleen SAILOR, MD as Consulting Physician (Gastroenterology)  I have updated your Care Teams any recent Medical Services you may have received from other providers in the past year.     Assessment:   This is a routine wellness examination for [name].  Hearing/Vision screen Hearing Screening - Comments:: Pt have hearing dif Vision Screening - Comments:: Pt wear reading glasses/pt goes to [facility] in [place], [place]   Goals Addressed             This Visit's Progress    Exercise 3x per week (30 min per time)   On track    Continue to exercise 3 times per week       Depression Screen     05/27/2024   10:11 AM 05/16/2023    9:42 AM 04/10/2023   10:09 AM 03/14/2023  1:35 PM 02/28/2023   10:01  AM 02/12/2023    9:55 AM 04/04/2022    7:48 AM  PHQ 2/9 Scores  PHQ - 2 Score 0 0 0 0 0 0 0    Fall Risk     05/27/2024   10:04 AM 05/16/2023    9:41 AM 04/10/2023   10:09 AM 03/14/2023    1:35 PM 02/28/2023    9:59 AM  Fall Risk   Falls in the past year? 0 0 0 0 0  Number falls in past yr: 0    0  Injury with Fall? 0    0  Risk for fall due to : No Fall Risks    No Fall Risks  Follow up Falls evaluation completed;Education provided    Falls prevention discussed    MEDICARE RISK AT HOME:  Medicare Risk at Home Any stairs in or around the home?: Yes If so, are there any without handrails?: Yes Home free of loose throw rugs in walkways, pet beds, electrical cords, etc?: Yes Adequate lighting in your home to reduce risk of falls?: Yes Life alert?: Yes Use of a cane, walker or w/c?: Yes Grab bars in the bathroom?: Yes Shower chair or bench in shower?: Yes Elevated toilet seat or a handicapped toilet?: Yes  TIMED UP AND GO:  Was the test performed?  no  Cognitive Function: 6CIT completed    09/04/2017    2:29 PM 05/25/2016    3:50 PM 12/21/2014    9:41 AM  MMSE - Mini Mental State Exam  Orientation to time 5  5  5    Orientation to Place 5  5  5    Registration 3  3  3    Attention/ Calculation 5  5  5    Recall 3  3  3    Language- name 2 objects 2  2  2    Language- repeat 1 1 1   Language- follow 3 step command 3  3  3    Language- read & follow direction 1  1  1    Write a sentence 1  1  1    Copy design 1  1  1    Total score 30  30  30       Data saved with a previous flowsheet row definition        05/27/2024   10:13 AM 02/28/2023   10:06 AM  6CIT Screen  What Year? 0 points 0 points  What month? 0 points 0 points  What time? 0 points 0 points  Count back from 20 0 points 0 points  Months in reverse 0 points 0 points  Repeat phrase 2 points 0 points  Total Score 2 points 0 points    Immunizations Immunization History  Administered Date(s) Administered   Fluad  Quad(high Dose 65+) 05/11/2019   Fluad Trivalent(High Dose 65+) 08/20/2023   Influenza Split 04/29/2013   Influenza,inj,Quad PF,6+ Mos 05/02/2015   Influenza-Unspecified 05/26/2014, 05/15/2016, 05/20/2017   Moderna Covid-19 Fall Seasonal Vaccine 37yrs & older 10/01/2023   Moderna Sars-Covid-2 Vaccination 08/22/2019, 09/19/2019, 03/30/2020, 10/28/2020   Pneumococcal Conjugate-13 06/14/2014   Pneumococcal Polysaccharide-23 06/09/2010   Tdap 01/12/2011, 04/04/2022   Zoster Recombinant(Shingrix ) 04/04/2022   Zoster, Live 10/29/2014    Screening Tests Health Maintenance  Topic Date Due   OPHTHALMOLOGY EXAM  Never done   FOOT EXAM  12/08/2021   Zoster Vaccines- Shingrix  (2 of 2) 05/30/2022   Influenza Vaccine  02/28/2024   COVID-19 Vaccine (6 - 2025-26 season) 03/30/2024  Diabetic kidney evaluation - Urine ACR  05/15/2024   HEMOGLOBIN A1C  08/19/2024   Colonoscopy  10/14/2024   Diabetic kidney evaluation - eGFR measurement  02/16/2025   Medicare Annual Wellness (AWV)  05/27/2025   DTaP/Tdap/Td (3 - Td or Tdap) 04/04/2032   Pneumococcal Vaccine: 50+ Years  Completed   Meningococcal B Vaccine  Aged Out   Hepatitis C Screening  Discontinued    Health Maintenance Items Addressed: See Nurse Notes at the end of this note  Additional Screening:  Vision Screening: Recommended annual ophthalmology exams for early detection of glaucoma and other disorders of the eye. Is the patient up to date with their annual eye exam?  Yes  Who is the provider or what is the name of the office in which the patient attends annual eye exams[facility] in [place], [place]  Dental Screening: Recommended annual dental exams for proper oral hygiene  Community Resource Referral / Chronic Care Management: CRR required this visit?  No   CCM required this visit?  No   Plan:    I have personally reviewed and noted the following in the patient's chart:   Medical and social history Use of alcohol, tobacco or  illicit drugs  Current medications and supplements including opioid prescriptions. Patient is not currently taking opioid prescriptions. Functional ability and status Nutritional status Physical activity Advanced directives List of other physicians Hospitalizations, surgeries, and ER visits in previous 12 months Vitals Screenings to include cognitive, depression, and falls Referrals and appointments  In addition, I have reviewed and discussed with patient certain preventive protocols, quality metrics, and best practice recommendations. A written personalized care plan for preventive services as well as general preventive health recommendations were provided to patient.   [name], CMA   05/27/2024   After Visit Summary: (MyChart) Due to this being a telephonic visit, the after visit summary with patients personalized plan was offered to patient via MyChart   Notes: PCP Follow Up Recommendations: pt is aware and due the following:covid, flu, shingles 2nd shot vaccines, foot exam, diabetic eye exam, UrineAcr

## 2024-05-27 NOTE — Patient Instructions (Signed)
 [name],  Thank you for taking the time for your Medicare Wellness Visit. I appreciate your continued commitment to your health goals. Please review the care plan we discussed, and feel free to reach out if I can assist you further.  Medicare recommends these wellness visits once per year to help you and your care team stay ahead of potential health issues. These visits are designed to focus on prevention, allowing your provider to concentrate on managing your acute and chronic conditions during your regular appointments.  Please note that Annual Wellness Visits do not include a physical exam. Some assessments may be limited, especially if the visit was conducted virtually. If needed, we may recommend a separate in-person follow-up with your provider.  Ongoing Care Seeing your primary care provider every 3 to 6 months helps us  monitor your health and provide consistent, personalized care.   Referrals If a referral was made during today's visit and you haven't received any updates within two weeks, please contact the referred provider directly to check on the status.  Recommended Screenings:  Health Maintenance  Topic Date Due   Eye exam for diabetics  Never done   Complete foot exam   12/08/2021   Zoster (Shingles) Vaccine (2 of 2) 05/30/2022   Flu Shot  02/28/2024   Medicare Annual Wellness Visit  02/28/2024   COVID-19 Vaccine (6 - 2025-26 season) 03/30/2024   Yearly kidney health urinalysis for diabetes  05/15/2024   Hemoglobin A1C  08/19/2024   Colon Cancer Screening  10/14/2024   Yearly kidney function blood test for diabetes  02/16/2025   DTaP/Tdap/Td vaccine (3 - Td or Tdap) 04/04/2032   Pneumococcal Vaccine for age over 67  Completed   Meningitis B Vaccine  Aged Out   Hepatitis C Screening  Discontinued       05/27/2024   10:11 AM  Advanced Directives  Does Patient Have a Medical Advance Directive? No   Advance Care Planning is important because it: Ensures you  receive medical care that aligns with your values, goals, and preferences. Provides guidance to your family and loved ones, reducing the emotional burden of decision-making during critical moments.  Vision: Annual vision screenings are recommended for early detection of glaucoma, cataracts, and diabetic retinopathy. These exams can also reveal signs of chronic conditions such as diabetes and high blood pressure.  Dental: Annual dental screenings help detect early signs of oral cancer, gum disease, and other conditions linked to overall health, including heart disease and diabetes.  Please see the attached documents for additional preventive care recommendations.

## 2024-06-03 ENCOUNTER — Encounter (INDEPENDENT_AMBULATORY_CARE_PROVIDER_SITE_OTHER): Payer: Self-pay | Admitting: Gastroenterology

## 2024-07-08 DIAGNOSIS — G4733 Obstructive sleep apnea (adult) (pediatric): Secondary | ICD-10-CM | POA: Diagnosis not present

## 2024-08-31 ENCOUNTER — Other Ambulatory Visit: Payer: Self-pay | Admitting: Family Medicine

## 2024-09-04 NOTE — Progress Notes (Signed)
 [name]                                          MRN: [id number]   09/04/2024   The [facility] Quality Team Specialist reviewed this patient medical record for the purposes of chart review for care gap closure. The following were reviewed: chart review for care gap closure-kidney health evaluation for diabetes:eGFR  and uACR.    [facility] Quality Team

## 2024-09-22 ENCOUNTER — Ambulatory Visit: Admitting: Nutrition
